# Patient Record
Sex: Female | Born: 1965
Health system: Southern US, Community
[De-identification: ages and names within clinical notes are randomized; demographics above are authoritative.]

## PROBLEM LIST (undated history)

## (undated) DIAGNOSIS — J45909 Unspecified asthma, uncomplicated: Secondary | ICD-10-CM

## (undated) DIAGNOSIS — H539 Unspecified visual disturbance: Secondary | ICD-10-CM

## (undated) DIAGNOSIS — G709 Myoneural disorder, unspecified: Secondary | ICD-10-CM

## (undated) DIAGNOSIS — E7800 Pure hypercholesterolemia, unspecified: Secondary | ICD-10-CM

## (undated) DIAGNOSIS — L309 Dermatitis, unspecified: Secondary | ICD-10-CM

## (undated) DIAGNOSIS — G35 Multiple sclerosis: Secondary | ICD-10-CM

## (undated) DIAGNOSIS — E119 Type 2 diabetes mellitus without complications: Secondary | ICD-10-CM

## (undated) DIAGNOSIS — I10 Essential (primary) hypertension: Secondary | ICD-10-CM

## (undated) DIAGNOSIS — D649 Anemia, unspecified: Secondary | ICD-10-CM

## (undated) DIAGNOSIS — E669 Obesity, unspecified: Secondary | ICD-10-CM

## (undated) HISTORY — PX: ABDOMINAL HYSTERECTOMY: SHX81

## (undated) HISTORY — PX: TUBAL LIGATION: SHX77

## (undated) HISTORY — PX: LASER ABLATION: SHX1947

## (undated) HISTORY — DX: Type 2 diabetes mellitus without complications: E11.9

## (undated) HISTORY — DX: Myoneural disorder, unspecified: G70.9

## (undated) HISTORY — DX: Pure hypercholesterolemia, unspecified: E78.00

## (undated) HISTORY — DX: Unspecified visual disturbance: H53.9

## (undated) HISTORY — PX: CERVICAL SPINE SURGERY: SHX589

## (undated) HISTORY — DX: Obesity, unspecified: E66.9

## (undated) HISTORY — PX: TENDON REPAIR: SHX5111

## (undated) HISTORY — DX: Unspecified asthma, uncomplicated: J45.909

## (undated) HISTORY — DX: Anemia, unspecified: D64.9

## (undated) HISTORY — PX: CARPAL TUNNEL RELEASE: SHX101

---

## 2009-08-03 ENCOUNTER — Other Ambulatory Visit: Admission: RE | Admit: 2009-08-03 | Discharge: 2009-08-03 | Payer: Self-pay | Admitting: Family Medicine

## 2009-08-03 ENCOUNTER — Ambulatory Visit: Payer: Self-pay | Admitting: Family Medicine

## 2009-08-03 DIAGNOSIS — G35 Multiple sclerosis: Secondary | ICD-10-CM | POA: Insufficient documentation

## 2009-08-03 DIAGNOSIS — J45909 Unspecified asthma, uncomplicated: Secondary | ICD-10-CM | POA: Insufficient documentation

## 2009-08-03 DIAGNOSIS — Z8669 Personal history of other diseases of the nervous system and sense organs: Secondary | ICD-10-CM | POA: Insufficient documentation

## 2009-08-03 DIAGNOSIS — J301 Allergic rhinitis due to pollen: Secondary | ICD-10-CM

## 2009-08-03 DIAGNOSIS — H469 Unspecified optic neuritis: Secondary | ICD-10-CM | POA: Insufficient documentation

## 2009-08-03 DIAGNOSIS — J4520 Mild intermittent asthma, uncomplicated: Secondary | ICD-10-CM | POA: Insufficient documentation

## 2009-08-03 DIAGNOSIS — D649 Anemia, unspecified: Secondary | ICD-10-CM | POA: Insufficient documentation

## 2009-08-03 HISTORY — DX: Allergic rhinitis due to pollen: J30.1

## 2009-08-06 LAB — CONVERTED CEMR LAB: Pap Smear: NEGATIVE

## 2009-08-27 ENCOUNTER — Ambulatory Visit: Payer: Self-pay | Admitting: Family Medicine

## 2009-08-27 ENCOUNTER — Encounter: Admission: RE | Admit: 2009-08-27 | Discharge: 2009-08-27 | Payer: Self-pay | Admitting: Family Medicine

## 2009-08-28 LAB — CONVERTED CEMR LAB
ALT: 12 units/L (ref 0–35)
AST: 14 units/L (ref 0–37)
Albumin: 3.6 g/dL (ref 3.5–5.2)
Alkaline Phosphatase: 74 units/L (ref 39–117)
Basophils Absolute: 0 10*3/uL (ref 0.0–0.1)
Basophils Relative: 0.8 % (ref 0.0–3.0)
Cholesterol: 171 mg/dL (ref 0–200)
Creatinine, Ser: 0.7 mg/dL (ref 0.4–1.2)
GFR calc non Af Amer: 100.02 mL/min (ref >60)
Lymphs Abs: 2.1 10*3/uL (ref 0.7–4.0)
MCHC: 34.3 g/dL (ref 30.0–36.0)
MCV: 88.9 fL (ref 78.0–100.0)
Monocytes Absolute: 0.3 10*3/uL (ref 0.1–1.0)
Monocytes Relative: 5.6 % (ref 3.0–12.0)
Neutro Abs: 3.2 10*3/uL (ref 1.4–7.7)
Platelets: 345 10*3/uL (ref 150.0–400.0)
Potassium: 4.4 meq/L (ref 3.5–5.1)
RDW: 13.8 % (ref 11.5–14.6)
Saturation Ratios: 20.6 % (ref 20.0–50.0)
Sodium: 139 meq/L (ref 135–145)
TSH: 1.27 microintl units/mL (ref 0.35–5.50)
Total Bilirubin: 0.9 mg/dL (ref 0.3–1.2)
Total Protein: 6.9 g/dL (ref 6.0–8.3)
Triglycerides: 68 mg/dL (ref 0.0–149.0)
VLDL: 13.6 mg/dL (ref 0.0–40.0)
WBC: 5.9 10*3/uL (ref 4.5–10.5)

## 2009-09-01 ENCOUNTER — Ambulatory Visit: Payer: Self-pay | Admitting: Family Medicine

## 2009-09-01 ENCOUNTER — Encounter (INDEPENDENT_AMBULATORY_CARE_PROVIDER_SITE_OTHER): Payer: Self-pay | Admitting: *Deleted

## 2009-10-06 ENCOUNTER — Ambulatory Visit: Payer: Self-pay | Admitting: Diagnostic Radiology

## 2009-10-06 ENCOUNTER — Ambulatory Visit: Payer: Self-pay | Admitting: Family Medicine

## 2009-10-06 ENCOUNTER — Emergency Department (HOSPITAL_BASED_OUTPATIENT_CLINIC_OR_DEPARTMENT_OTHER): Admission: EM | Admit: 2009-10-06 | Discharge: 2009-10-06 | Payer: Self-pay | Admitting: Emergency Medicine

## 2009-10-06 DIAGNOSIS — R079 Chest pain, unspecified: Secondary | ICD-10-CM | POA: Insufficient documentation

## 2010-02-23 NOTE — Consult Note (Signed)
Summary: [facility]  [facility]   Imported By: Lanelle Bal 09/07/2009 13:47:38  _____________________________________________________________________  External Attachment:    Type:   Image     Comment:   External Document

## 2010-02-23 NOTE — Assessment & Plan Note (Signed)
Summary: NEW TO ESTAB/CBS   Vital Signs:  Patient profile:   45 year old female Height:      68.5 inches Weight:      228 pounds BMI:     34.29 Temp:     98.3 degrees F oral Pulse rate:   97 / minute Resp:     18 per minute BP sitting:   140 / 90  (left arm)  Vitals Entered By: Jeremy Johann CMA (August 03, 2009 2:04 PM) CC: NEW TO ESTABLISH, CPX, PAP Comments REVIEWED MED LIST, PATIENT AGREED DOSE AND INSTRUCTION CORRECT    History of Present Illness: Pt is here for CPE , pap and labs.  Pt with hx MS--[place] just [place] and she needs a new neurologist.    No complaints.  Preventive Screening-Counseling & Management  Alcohol-Tobacco     Alcohol drinks/day: <1     Smoking Status: never  Caffeine-Diet-Exercise     Caffeine use/day: 1     Does Patient Exercise: no     Exercise Counseling: to improve exercise regimen  Hep-HIV-STD-Contraception     Dental Visit-last 6 months yes     Dental Care Counseling: not indicated; dental care within six months     SBE monthly: no     SBE Education/Counseling: to perform regular SBE      Sexual History:  currently monogamous.    Current Medications (verified): 1)  Vitamin D 2000 Unit Tabs (Cholecalciferol) .... Once Daily 2)  Amphetamine-Dextroamphetamine 10 Mg Xr24h-Cap (Amphetamine-Dextroamphetamine) .... 2 By Mouth Once Daily 3)  Copaxone 20 Mg/ml Kit (Glatiramer Acetate) .Marland Kitchen.. 1 Injection A Week  Allergies (verified): 1)  ! Asa 2)  ! Ibuprofen  Past History:  Family History: Last updated: 08/03/2009 DM-MOTHER Sharman Crate STROKE-MOTHER  Social History: Last updated: 08/03/2009 Married  Risk Factors: Alcohol Use: <1 (08/03/2009) Caffeine Use: 1 (08/03/2009) Exercise: no (08/03/2009)  Risk Factors: Smoking Status: never (08/03/2009)  Past Medical History: Anemia-NOS Asthma MS  Past Surgical History: Carpal tunneL-1998 BLOOD PATCH-2009?  Family History: Reviewed history and no changes  required. DM-MOTHER HTN-MOTHER STROKE-MOTHER  Social History: Reviewed history and no changes required. Married Does Patient Exercise:  no Smoking Status:  never Caffeine use/day:  1 Dental Care w/in 6 mos.:  yes Sexual History:  currently monogamous  Review of Systems      See HPI General:  Denies chills, fatigue, fever, loss of appetite, malaise, sleep disorder, sweats, weakness, and weight loss. Eyes:  Denies blurring, discharge, double vision, eye irritation, eye pain, halos, itching, light sensitivity, red eye, vision loss-1 eye, and vision loss-both eyes; optho-q1y hx optic neuritis. ENT:  Denies decreased hearing, difficulty swallowing, ear discharge, earache, hoarseness, nasal congestion, nosebleeds, postnasal drainage, ringing in ears, sinus pressure, and sore throat. CV:  Denies bluish discoloration of lips or nails, chest pain or discomfort, difficulty breathing at night, difficulty breathing while lying down, fainting, fatigue, leg cramps with exertion, lightheadness, near fainting, palpitations, shortness of breath with exertion, swelling of feet, swelling of hands, and weight gain. Resp:  Denies chest discomfort, chest pain with inspiration, cough, coughing up blood, excessive snoring, hypersomnolence, morning headaches, pleuritic, shortness of breath, sputum productive, and wheezing. GI:  Denies abdominal pain, bloody stools, change in bowel habits, constipation, dark tarry stools, diarrhea, excessive appetite, gas, hemorrhoids, indigestion, loss of appetite, and nausea. GU:  Denies abnormal vaginal bleeding, decreased libido, discharge, dysuria, genital sores, hematuria, incontinence, nocturia, urinary frequency, and urinary hesitancy. MS:  Denies joint pain, joint redness, joint  swelling, loss of strength, low back pain, mid back pain, muscle aches, muscle , cramps, muscle weakness, stiffness, and thoracic pain. Derm:  Denies changes in color of skin, changes in nail beds,  dryness, excessive perspiration, flushing, hair loss, insect bite(s), itching, lesion(s), poor wound healing, and rash. Neuro:  Denies brief paralysis, difficulty with concentration, disturbances in coordination, falling down, headaches, inability to speak, memory loss, numbness, poor balance, seizures, sensation of room spinning, tingling, tremors, visual disturbances, and weakness. Psych:  Denies alternate hallucination ( auditory/visual), anxiety, depression, easily angered, easily tearful, irritability, mental problems, panic attacks, sense of great danger, suicidal thoughts/plans, thoughts of violence, unusual visions or sounds, and thoughts /plans of harming others. Endo:  Denies cold intolerance, excessive hunger, excessive thirst, excessive urination, heat intolerance, polyuria, and weight change. Heme:  Denies abnormal bruising, bleeding, enlarge lymph nodes, fevers, pallor, and skin discoloration. Allergy:  Complains of seasonal allergies.  Physical Exam  General:  Well-developed,well-nourished,in no acute distress; alert,appropriate and cooperative throughout examination Head:  Normocephalic and atraumatic without obvious abnormalities. No apparent alopecia or balding. Eyes:  pupils equal, pupils round, and pupils reactive to light.  hyperpigmentation around eyes. Ears:  External ear exam shows no significant lesions or deformities.  Otoscopic examination reveals clear canals, tympanic membranes are intact bilaterally without bulging, retraction, inflammation or discharge. Hearing is grossly normal bilaterally. Nose:  External nasal examination shows no deformity or inflammation. Nasal mucosa are pink and moist without lesions or exudates. Mouth:  Oral mucosa and oropharynx without lesions or exudates.  Teeth in good repair. Neck:  No deformities, masses, or tenderness noted. Chest Wall:  No deformities, masses, or tenderness noted. Breasts:  No mass, nodules, thickening, tenderness,  bulging, retraction, inflamation, nipple discharge or skin changes noted.   Lungs:  Normal respiratory effort, chest expands symmetrically. Lungs are clear to auscultation, no crackles or wheezes. Heart:  normal rate and no murmur.   Abdomen:  Bowel sounds positive,abdomen soft and non-tender without masses, organomegaly or hernias noted. Rectal:  No external abnormalities noted. Normal sphincter tone. No rectal masses or tenderness.  heme neg brown stool Genitalia:  Pelvic Exam:        External: normal female genitalia without lesions or masses        Vagina: normal without lesions or masses        Cervix: normal without lesions or masses        Adnexa: normal bimanual exam without masses or fullness        Uterus: normal by palpation        Pap smear: performed Msk:  normal ROM, no joint tenderness, no joint swelling, no joint warmth, no redness over joints, no joint deformities, no joint instability, and no crepitation.   Pulses:  R posterior tibial normal, R dorsalis pedis normal, R carotid normal, L posterior tibial normal, L dorsalis pedis normal, and L carotid normal.   Extremities:  No clubbing, cyanosis, edema, or deformity noted with normal full range of motion of all joints.   Neurologic:  No cranial nerve deficits noted. Station and gait are normal. Plantar reflexes are down-going bilaterally. DTRs are symmetrical throughout. Sensory, motor and coordinative functions appear intact. Skin:  Intact without suspicious lesions or rashes Cervical Nodes:  No lymphadenopathy noted Axillary Nodes:  No palpable lymphadenopathy Psych:  Cognition and judgment appear intact. Alert and cooperative with normal attention span and concentration. No apparent delusions, illusions, hallucinations   Impression & Recommendations:  Problem # 1:  PREVENTIVE HEALTH CARE (  ICD-V70.0) fasting labs ghm utd  Orders: Radiology Referral (Radiology) EKG w/ Interpretation (93000)  Problem # 2:  MULTIPLE  SCLEROSIS (ICD-340)  Orders: Neurology Referral (Neuro) EKG w/ Interpretation (93000)  Problem # 3:  ALLERGIC RHINITIS, SEASONAL (ICD-477.0)  Orders: Pulmonary Referral (Pulmonary) EKG w/ Interpretation (93000)  Problem # 4:  ASTHMA (ICD-493.90)  Orders: Pulmonary Referral (Pulmonary) EKG w/ Interpretation (93000)  Problem # 5:  ANEMIA-NOS (ICD-285.9)  check labs  Orders: EKG w/ Interpretation (93000)  Problem # 6:  Hx of OPTIC NEURITIS (ICD-377.30)  Orders: Ophthalmology Referral (Ophthalmology) EKG w/ Interpretation (93000)  Complete Medication List: 1)  Vitamin D 2000 Unit Tabs (Cholecalciferol) .... Once daily 2)  Amphetamine-dextroamphetamine 10 Mg Xr24h-cap (Amphetamine-dextroamphetamine) .... 2 by mouth once daily 3)  Copaxone 20 Mg/ml Kit (Glatiramer acetate) .Marland Kitchen.. 1 injection a week 4)  Triamcinolone Acetonide 0.1 % Crea (Triamcinolone acetonide) .... Three times a day as needed  Patient Instructions: 1)  fasting labs--V70.0  cbcd, bmp, hep, lipid, tsh, UA , 285.9  ibc, ferritin, b12 Prescriptions: TRIAMCINOLONE ACETONIDE 0.1 % CREA (TRIAMCINOLONE ACETONIDE) three times a day as needed  #30g x 2   Entered and Authorized by:   Loreen Freud DO   Signed by:   Loreen Freud DO on 08/03/2009   Method used:   Electronically to        [facility] [phone]* (retail)       [address]       [place], [place]  [address]       Ph: [phone]       Fax: [phone]   RxID:   [id number] AMPHETAMINE-DEXTROAMPHETAMINE 10 MG XR24H-CAP (AMPHETAMINE-DEXTROAMPHETAMINE) 2 by mouth once daily  #60 x 0   Entered and Authorized by:   Loreen Freud DO   Signed by:   Loreen Freud DO on 08/03/2009   Method used:   Historical   RxID:   [id number]

## 2010-02-23 NOTE — Assessment & Plan Note (Signed)
Summary: sob- tingleing in legs/cbs   Vital Signs:  Patient profile:   45 year old female Weight:      232.6 pounds O2 Sat:      98 % on Room air Temp:     98.6 degrees F oral Pulse rate:   108 / minute Pulse rhythm:   regular BP sitting:   118 / 82  (right arm)  Vitals Entered By: Almeta Monas CMA Duncan Dull) (October 06, 2009 11:05 AM)  O2 Flow:  Room air CC: c/o SOB, Chest tightness, palpatations,and pain down thigh and legs after taking meds   History of Present Illness:       This is a 45 year old woman who presents with Chest Pain.  The symptoms began 1 day ago.  Pt also had numbness in legs yesterday.  that got worse with getting up.  Eventually it went away.  Pt felt tired and sluggish after that.  The patient reports resting chest pain, shortness of breath, and palpitations, but denies exertional chest pain, nausea, vomiting, diaphoresis, dizziness, light headedness, syncope, and indigestion.  The pain is described as intermittent and band-like.  The pain is located in the substernal area and the pain does not radiate.  Episodes of chest pain last >30 minutes.  The pain is relieved or improved with rest and change in position.    Current Medications (verified): 1)  Vitamin D 2000 Unit Tabs (Cholecalciferol) .... Once Daily 2)  Amphetamine-Dextroamphetamine 10 Mg Xr24h-Cap (Amphetamine-Dextroamphetamine) .... 2 By Mouth Once Daily 3)  Copaxone 20 Mg/ml Kit (Glatiramer Acetate) .Marland Kitchen.. 1 Injection A Week 4)  Triamcinolone Acetonide 0.1 % Crea (Triamcinolone Acetonide) .... Three Times A Day As Needed  Allergies (verified): 1)  ! Asa 2)  ! Ibuprofen  Past History:  Past medical, surgical, family and social histories (including risk factors) reviewed for relevance to current acute and chronic problems.  Past Medical History: Reviewed history from 08/03/2009 and no changes required. Anemia-NOS Asthma MS  Past Surgical History: Reviewed history from 08/03/2009 and no  changes required. Carpal tunneL-1998 BLOOD PATCH-2009?  Family History: Reviewed history from 08/03/2009 and no changes required. DM-MOTHER HTN-MOTHER STROKE-MOTHER  Social History: Reviewed history from 08/03/2009 and no changes required. Married  Review of Systems      See HPI  Physical Exam  Neck:  No deformities, masses, or tenderness noted. Lungs:  Normal respiratory effort, chest expands symmetrically. Lungs are clear to auscultation, no crackles or wheezes. Heart:  regular rhythm and no murmur.   tachy Extremities:  No clubbing, cyanosis, edema, or deformity noted with normal full range of motion of all joints.   Psych:  Cognition and judgment appear intact. Alert and cooperative with normal attention span and concentration. No apparent delusions, illusions, hallucinations   Impression & Recommendations:  Problem # 1:  CHEST PAIN UNSPECIFIED (ICD-786.50)  pt was sent to er for evaluation=---- r/o PE  Orders: EKG w/ Interpretation (93000)  Complete Medication List: 1)  Vitamin D 2000 Unit Tabs (Cholecalciferol) .... Once daily 2)  Amphetamine-dextroamphetamine 10 Mg Xr24h-cap (Amphetamine-dextroamphetamine) .... 2 by mouth once daily 3)  Copaxone 20 Mg/ml Kit (Glatiramer acetate) .Marland Kitchen.. 1 injection a week 4)  Triamcinolone Acetonide 0.1 % Crea (Triamcinolone acetonide) .... Three times a day as needed

## 2010-02-23 NOTE — Letter (Signed)
Summary: [facility]: Immunoassay Fecal Occult Blood (iFOB) Order Form  [facility]  [address] [place], [place] [address]   Phone: [phone]  Fax: [phone]      [facility]: Immunoassay Fecal Occult Blood (iFOB) Order Form   September 01, 2009 MRN: [id number]   Cathy [name] 12-24-1965   Physicican Name:_______dr lowne__________________  Diagnosis Code:_________v76.51_________________      Jeremy Johann CMA

## 2010-03-22 ENCOUNTER — Telehealth: Payer: Self-pay | Admitting: Family Medicine

## 2010-04-01 NOTE — Progress Notes (Signed)
Summary: change physician  Phone Note Call from Patient Call back at [phone]   Caller: Patient Summary of Call: Pt would like to be referred to a new neurologist. Pt notes that she is having difficulty getting them to fill meds and provide the care she needs. Pls advise on referral........Marland KitchenFelecia Deloach CMA  March 22, 2010 8:39 AM   Follow-up for Phone Call        referral put in Follow-up by: Loreen Freud DO,  March 22, 2010 11:27 AM  Additional Follow-up for Phone Call Additional follow up Details #1::        pt aware referral put in.... Almeta Monas CMA Duncan Dull)  March 22, 2010 11:30 AM

## 2010-04-08 LAB — BASIC METABOLIC PANEL
CO2: 27 mEq/L (ref 19–32)
Calcium: 9.8 mg/dL (ref 8.4–10.5)
Chloride: 103 mEq/L (ref 96–112)
GFR calc Af Amer: >60 mL/min (ref >60)
GFR calc non Af Amer: >60 mL/min (ref >60)
Glucose, Bld: 108 mg/dL — ABNORMAL HIGH (ref 70–99)

## 2010-04-08 LAB — POCT CARDIAC MARKERS
CKMB, poc: <1 ng/mL — ABNORMAL LOW (ref 1.0–8.0)
Myoglobin, poc: 28.3 ng/mL (ref 12–200)
Troponin i, poc: <0.05 ng/mL (ref 0.00–0.09)

## 2010-04-08 LAB — CBC
HCT: 37.2 % (ref 36.0–46.0)
Hemoglobin: 12.5 g/dL (ref 12.0–15.0)
MCH: 29.9 pg (ref 26.0–34.0)
Platelets: 350 10*3/uL (ref 150–400)
RDW: 12.9 % (ref 11.5–15.5)
WBC: 6 10*3/uL (ref 4.0–10.5)

## 2010-04-08 LAB — DIFFERENTIAL
Basophils Absolute: 0 10*3/uL (ref 0.0–0.1)
Basophils Relative: 1 % (ref 0–1)
Eosinophils Absolute: 0.2 10*3/uL (ref 0.0–0.7)
Eosinophils Relative: 4 % (ref 0–5)
Lymphocytes Relative: 33 % (ref 12–46)

## 2010-04-08 LAB — PREGNANCY, URINE: Preg Test, Ur: NEGATIVE

## 2010-06-12 ENCOUNTER — Other Ambulatory Visit: Payer: Self-pay | Admitting: Family Medicine

## 2010-07-30 ENCOUNTER — Other Ambulatory Visit: Payer: Self-pay | Admitting: Internal Medicine

## 2010-07-30 DIAGNOSIS — Z1231 Encounter for screening mammogram for malignant neoplasm of breast: Secondary | ICD-10-CM

## 2010-09-08 ENCOUNTER — Encounter: Payer: Self-pay | Admitting: Family Medicine

## 2010-09-13 ENCOUNTER — Ambulatory Visit
Admission: RE | Admit: 2010-09-13 | Discharge: 2010-09-13 | Disposition: A | Discharge status: Home / Self Care | Payer: BC Managed Care – PPO | Source: Ambulatory Visit | Attending: Internal Medicine | Admitting: Internal Medicine

## 2010-09-13 DIAGNOSIS — Z1231 Encounter for screening mammogram for malignant neoplasm of breast: Secondary | ICD-10-CM

## 2011-03-12 ENCOUNTER — Emergency Department (HOSPITAL_BASED_OUTPATIENT_CLINIC_OR_DEPARTMENT_OTHER)
Admission: EM | Admit: 2011-03-12 | Discharge: 2011-03-12 | Disposition: A | Discharge status: Home / Self Care | Payer: Federal, State, Local not specified - PPO | Attending: Emergency Medicine | Admitting: Emergency Medicine

## 2011-03-12 ENCOUNTER — Encounter (HOSPITAL_BASED_OUTPATIENT_CLINIC_OR_DEPARTMENT_OTHER): Payer: Self-pay | Admitting: *Deleted

## 2011-03-12 DIAGNOSIS — G35 Multiple sclerosis: Secondary | ICD-10-CM | POA: Insufficient documentation

## 2011-03-12 DIAGNOSIS — K529 Noninfective gastroenteritis and colitis, unspecified: Secondary | ICD-10-CM

## 2011-03-12 DIAGNOSIS — R5381 Other malaise: Secondary | ICD-10-CM | POA: Insufficient documentation

## 2011-03-12 DIAGNOSIS — R5383 Other fatigue: Secondary | ICD-10-CM | POA: Insufficient documentation

## 2011-03-12 DIAGNOSIS — K5289 Other specified noninfective gastroenteritis and colitis: Secondary | ICD-10-CM | POA: Insufficient documentation

## 2011-03-12 HISTORY — DX: Multiple sclerosis: G35

## 2011-03-12 HISTORY — DX: Dermatitis, unspecified: L30.9

## 2011-03-12 LAB — URINALYSIS, ROUTINE W REFLEX MICROSCOPIC
Bilirubin Urine: NEGATIVE
Glucose, UA: NEGATIVE mg/dL
Hgb urine dipstick: NEGATIVE
Leukocytes, UA: NEGATIVE
Specific Gravity, Urine: 1.017 (ref 1.005–1.030)

## 2011-03-12 LAB — BASIC METABOLIC PANEL
Chloride: 100 mEq/L (ref 96–112)
Creatinine, Ser: 0.6 mg/dL (ref 0.50–1.10)
GFR calc Af Amer: >90 mL/min (ref >90)
GFR calc non Af Amer: >90 mL/min (ref >90)

## 2011-03-12 MED ORDER — SODIUM CHLORIDE 0.9 % IV BOLUS (SEPSIS)
1000.0000 mL | Freq: Once | INTRAVENOUS | Status: AC
  Administered 2011-03-12: 1000 mL via INTRAVENOUS

## 2011-03-12 NOTE — ED Notes (Signed)
Pt describes flu-like s/s since 0300. Diarrhea, nausea, generalized weakness and body aches.

## 2011-03-12 NOTE — Discharge Instructions (Signed)
Avoid milk or foods containing milk until diarrhea stops. You may eat normally otherwise. He may take Imodium right ear as directed for diarrhea. Your blood sugar today was mildly elevated at 130. Ask your physician to recheck a fasting blood sugar or hemoglobin A1c within the next one or 2 weeks. Return or see your doctor if diarrhea has not stopped within the next 2 or 3 days or if you feel worse for any reason, come back sooner

## 2011-03-12 NOTE — ED Provider Notes (Signed)
History    Scribed for Doug Sou, MD, the patient was seen in room MH05/MH05. This chart was scribed by Katha Cabal.   CSN: [id number]  Arrival date & time 03/12/11  1256   First MD Initiated Contact with Patient 03/12/11 1543      Chief Complaint  Patient presents with  . Weakness    (Consider location/radiation/quality/duration/timing/severity/associated sxs/prior treatment) HPI  Cathy [name] is a 46 y.o. female who presents to the Emergency Department complaining of persistent generalized weakness and flu like symptoms today.  Symptoms are associated with myalgias, nausea, diarrhea  Patient reports 6-7 episodes. No blood per rectum.  Symptoms are not associated with fever, cough, sore throat, vomiting.  Patient has multiple sclerosis and eczema.  Family member reports patient with burning sensation in abdomen.  Patient has not taken anything for symptoms.  Patient does not report nausea currently.     PCP Loreen Freud, DO, DO     Past Medical History  Diagnosis Date  . Multiple sclerosis   . Eczema     Past Surgical History  Procedure Date  . Tubal ligation   . Carpal tunnel release   . Laser ablation     History reviewed. No pertinent family history.  History  Substance Use Topics  . Smoking status: Never Smoker   . Smokeless tobacco: Not on file  . Alcohol Use: occasional EtOH use     OB History    Grav Para Term Preterm Abortions TAB SAB Ect Mult Living                  Review of Systems  Constitutional: Positive for fatigue.  HENT: Negative.   Respiratory: Negative.   Cardiovascular: Negative.   Gastrointestinal: Positive for nausea and diarrhea.  Musculoskeletal: Positive for myalgias.  Skin: Negative.   Neurological: Positive for weakness (generalized ).  Hematological: Negative.   Psychiatric/Behavioral: Negative.     Allergies  Aspirin and Ibuprofen  Home Medications   Current Outpatient Rx  Name Route Sig Dispense Refill   . VITAMIN D 2000 UNITS PO CAPS Oral Take 2,000 Units by mouth daily.    Marland Kitchen DEXTROAMPHETAMINE SULFATE ER 10 MG PO CP24 Oral Take 20-40 mg by mouth daily.    Marland Kitchen LORATADINE 10 MG PO TABS Oral Take 10 mg by mouth daily.    . STUDY MEDICATION Oral Take 1 tablet by mouth daily.    . TRIAMCINOLONE ACETONIDE 0.1 % EX CREA  APPLY TO THE AFFECTED AREA THREE TIMES DAILY AS NEEDED 30 g 1    BP 116/75  Pulse 98  Temp(Src) 99.4 F (37.4 C) (Oral)  Resp 16  Ht 5\' 6"  (1.676 m)  Wt 227 lb (102.967 kg)  BMI 36.64 kg/m2  SpO2 100%  Physical Exam  Nursing note and vitals reviewed. Constitutional: She appears well-developed and well-nourished.  HENT:  Head: Normocephalic and atraumatic.  Mouth/Throat: Mucous membranes are dry.  Eyes: Conjunctivae are normal. Pupils are equal, round, and reactive to light.  Neck: Neck supple. No tracheal deviation present. No thyromegaly present.  Cardiovascular: Normal rate and regular rhythm.   No murmur heard. Pulmonary/Chest: Effort normal and breath sounds normal.  Abdominal: Soft. Bowel sounds are normal. She exhibits no distension. There is no tenderness.  Musculoskeletal: Normal range of motion. She exhibits no edema and no tenderness.  Neurological: She is alert. Coordination normal.       Gait normal  Skin: Skin is warm and dry. No rash noted.  Psychiatric: She  has a normal mood and affect.    ED Course  Procedures (including critical care time)   DIAGNOSTIC STUDIES: Oxygen Saturation is 100% on room air, normal by my interpretation.     COORDINATION OF CARE: 3:55 PM  Physical exam complete. IV fluids. 5:27 PM  Recheck.  Advised patient to increase fluid intake.  Plan to discharge patient.  Patient agrees with plan.         LABS / RADIOLOGY:   Labs Reviewed  BASIC METABOLIC PANEL - Abnormal; Notable for the following:    Glucose, Bld 130 (*)    All other components within normal limits  URINALYSIS, ROUTINE W REFLEX MICROSCOPIC    PREGNANCY, URINE   No results found. 5:25 PM feels much improved after treatment with intravenous fluids. Patient alert no distress  No diagnosis found.  Results for orders placed during the hospital encounter of 03/12/11  URINALYSIS, ROUTINE W REFLEX MICROSCOPIC      Component Value Range   Color, Urine YELLOW  YELLOW    APPearance CLEAR  CLEAR    Specific Gravity, Urine 1.017  1.005 - 1.030    pH 5.5  5.0 - 8.0    Glucose, UA NEGATIVE  NEGATIVE (mg/dL)   Hgb urine dipstick NEGATIVE  NEGATIVE    Bilirubin Urine NEGATIVE  NEGATIVE    Ketones, ur NEGATIVE  NEGATIVE (mg/dL)   Protein, ur NEGATIVE  NEGATIVE (mg/dL)   Urobilinogen, UA 0.2  0.0 - 1.0 (mg/dL)   Nitrite NEGATIVE  NEGATIVE    Leukocytes, UA NEGATIVE  NEGATIVE   PREGNANCY, URINE      Component Value Range   Preg Test, Ur NEGATIVE  NEGATIVE   BASIC METABOLIC PANEL      Component Value Range   Sodium 136  135 - 145 (mEq/L)   Potassium 3.8  3.5 - 5.1 (mEq/L)   Chloride 100  96 - 112 (mEq/L)   CO2 25  19 - 32 (mEq/L)   Glucose, Bld 130 (*) 70 - 99 (mg/dL)   BUN 11  6 - 23 (mg/dL)   Creatinine, Ser 4.09  0.50 - 1.10 (mg/dL)   Calcium 81.1  8.4 - 10.5 (mg/dL)   GFR calc non Af Amer >90  >90 (mL/min)   GFR calc Af Amer >90  >90 (mL/min)   No results found.   MDM   clinically , pt was mildly dehydrated  on initial presentation. Plan: encourage po sugar freee drinks Blood sugar recheck with pmd Imodium ad prn Dx #1 enteritis  #2 dehydration  #3 hyperglycemia    I personally performed the services described in this documentation, which was scribed in my presence. The recorded information has been reviewed and considered.  Doug Sou, MD 03/12/11 1731

## 2011-03-12 NOTE — ED Notes (Signed)
Patient is resting comfortably on the stretcher, she was asked to undress and place gown on.  Pt is not responding to questions, her husband sitting at the bedside answering questions.  Pt was asked to answer questions herself and began to speak at that point.

## 2012-06-28 ENCOUNTER — Other Ambulatory Visit: Payer: Self-pay | Admitting: Neurology

## 2012-10-18 ENCOUNTER — Other Ambulatory Visit: Payer: Self-pay | Admitting: Diagnostic Neuroimaging

## 2012-11-05 ENCOUNTER — Emergency Department (HOSPITAL_BASED_OUTPATIENT_CLINIC_OR_DEPARTMENT_OTHER): Payer: Federal, State, Local not specified - PPO

## 2012-11-05 ENCOUNTER — Encounter (HOSPITAL_BASED_OUTPATIENT_CLINIC_OR_DEPARTMENT_OTHER): Payer: Self-pay | Admitting: Emergency Medicine

## 2012-11-05 ENCOUNTER — Emergency Department (HOSPITAL_BASED_OUTPATIENT_CLINIC_OR_DEPARTMENT_OTHER)
Admission: EM | Admit: 2012-11-05 | Discharge: 2012-11-05 | Disposition: A | Discharge status: Home / Self Care | Payer: Federal, State, Local not specified - PPO | Attending: Emergency Medicine | Admitting: Emergency Medicine

## 2012-11-05 DIAGNOSIS — Z90711 Acquired absence of uterus with remaining cervical stump: Secondary | ICD-10-CM | POA: Insufficient documentation

## 2012-11-05 DIAGNOSIS — Z8669 Personal history of other diseases of the nervous system and sense organs: Secondary | ICD-10-CM | POA: Insufficient documentation

## 2012-11-05 DIAGNOSIS — M549 Dorsalgia, unspecified: Secondary | ICD-10-CM | POA: Insufficient documentation

## 2012-11-05 DIAGNOSIS — I10 Essential (primary) hypertension: Secondary | ICD-10-CM | POA: Insufficient documentation

## 2012-11-05 DIAGNOSIS — Z872 Personal history of diseases of the skin and subcutaneous tissue: Secondary | ICD-10-CM | POA: Insufficient documentation

## 2012-11-05 DIAGNOSIS — Z79899 Other long term (current) drug therapy: Secondary | ICD-10-CM | POA: Insufficient documentation

## 2012-11-05 DIAGNOSIS — N390 Urinary tract infection, site not specified: Secondary | ICD-10-CM

## 2012-11-05 DIAGNOSIS — Z3202 Encounter for pregnancy test, result negative: Secondary | ICD-10-CM | POA: Insufficient documentation

## 2012-11-05 DIAGNOSIS — R1031 Right lower quadrant pain: Secondary | ICD-10-CM

## 2012-11-05 HISTORY — DX: Essential (primary) hypertension: I10

## 2012-11-05 LAB — COMPREHENSIVE METABOLIC PANEL
Albumin: 3.4 g/dL — ABNORMAL LOW (ref 3.5–5.2)
Alkaline Phosphatase: 83 U/L (ref 39–117)
BUN: 10 mg/dL (ref 6–23)
Calcium: 9.3 mg/dL (ref 8.4–10.5)
Chloride: 101 mEq/L (ref 96–112)
Creatinine, Ser: 0.7 mg/dL (ref 0.50–1.10)
GFR calc Af Amer: >90 mL/min (ref >90)
GFR calc non Af Amer: >90 mL/min (ref >90)
Glucose, Bld: 134 mg/dL — ABNORMAL HIGH (ref 70–99)
Potassium: 3.9 mEq/L (ref 3.5–5.1)
Total Bilirubin: 0.3 mg/dL (ref 0.3–1.2)

## 2012-11-05 LAB — WET PREP, GENITAL
Clue Cells Wet Prep HPF POC: NONE SEEN
Trich, Wet Prep: NONE SEEN

## 2012-11-05 LAB — CBC WITH DIFFERENTIAL/PLATELET
Basophils Relative: 0 % (ref 0–1)
Eosinophils Relative: 4 % (ref 0–5)
HCT: 36 % (ref 36.0–46.0)
Hemoglobin: 12.2 g/dL (ref 12.0–15.0)
Lymphs Abs: 0.3 10*3/uL — ABNORMAL LOW (ref 0.7–4.0)
MCH: 29.5 pg (ref 26.0–34.0)
MCHC: 33.9 g/dL (ref 30.0–36.0)
Monocytes Absolute: 0.4 10*3/uL (ref 0.1–1.0)
Monocytes Relative: 9 % (ref 3–12)
Neutro Abs: 3.1 10*3/uL (ref 1.7–7.7)
RBC: 4.14 MIL/uL (ref 3.87–5.11)
RDW: 13.2 % (ref 11.5–15.5)

## 2012-11-05 LAB — URINALYSIS, ROUTINE W REFLEX MICROSCOPIC
Bilirubin Urine: NEGATIVE
Hgb urine dipstick: NEGATIVE
Protein, ur: NEGATIVE mg/dL
Specific Gravity, Urine: 1.018 (ref 1.005–1.030)
Urobilinogen, UA: 0.2 mg/dL (ref 0.0–1.0)
pH: 6 (ref 5.0–8.0)

## 2012-11-05 LAB — URINE MICROSCOPIC-ADD ON

## 2012-11-05 LAB — GC/CHLAMYDIA PROBE AMP
CT Probe RNA: NEGATIVE
GC Probe RNA: NEGATIVE

## 2012-11-05 MED ORDER — IOHEXOL 300 MG/ML  SOLN
100.0000 mL | Freq: Once | INTRAMUSCULAR | Status: AC | PRN
  Administered 2012-11-05: 100 mL via INTRAVENOUS

## 2012-11-05 MED ORDER — HYDROCODONE-ACETAMINOPHEN 5-325 MG PO TABS: 1.0000 | ORAL_TABLET | Freq: Four times a day (QID) | ORAL | Status: DC | PRN

## 2012-11-05 MED ORDER — MORPHINE SULFATE 4 MG/ML IJ SOLN
4.0000 mg | Freq: Once | INTRAMUSCULAR | Status: AC
  Administered 2012-11-05: 4 mg via INTRAVENOUS
  Filled 2012-11-05: qty 1

## 2012-11-05 MED ORDER — SODIUM CHLORIDE 0.9 % IV BOLUS (SEPSIS)
1000.0000 mL | Freq: Once | INTRAVENOUS | Status: AC
  Administered 2012-11-05: 1000 mL via INTRAVENOUS

## 2012-11-05 MED ORDER — CEPHALEXIN 500 MG PO CAPS: 500.0000 mg | ORAL_CAPSULE | Freq: Four times a day (QID) | ORAL | Status: DC

## 2012-11-05 MED ORDER — SODIUM CHLORIDE 0.9 % IV SOLN
Freq: Once | INTRAVENOUS | Status: AC
  Administered 2012-11-05: 06:00:00 via INTRAVENOUS

## 2012-11-05 MED ORDER — HYDROCODONE-ACETAMINOPHEN 5-325 MG PO TABS
2.0000 | ORAL_TABLET | Freq: Once | ORAL | Status: AC
  Administered 2012-11-05: 2 via ORAL
  Filled 2012-11-05: qty 2

## 2012-11-05 MED ORDER — PHENAZOPYRIDINE HCL 95 MG PO TABS: 95.0000 mg | ORAL_TABLET | Freq: Three times a day (TID) | ORAL | Status: DC | PRN

## 2012-11-05 NOTE — ED Notes (Signed)
Pt requesting more pain medication. MD made aware

## 2012-11-05 NOTE — ED Notes (Signed)
Patient transported to CT 

## 2012-11-05 NOTE — ED Provider Notes (Signed)
CSN: [id number]     Arrival date & time 11/05/12  0358 History   First MD Initiated Contact with Patient 11/05/12 0407     Chief Complaint  Patient presents with  . Abdominal Pain  . Back Pain   (Consider location/radiation/quality/duration/timing/severity/associated sxs/prior Treatment) HPI Comments: Pt with hx of MS, partial hysterectomy comes in with cc of abd pain. Pt's pain is located in the RLQ and started 2 days ago. The pain is intermittent, but very frequent, and stabbing in nature, radiating to the flank region. Pt has no dysuria, hematuria. She hasn o vaginal bleeding, discharge. No diarrhea, no n/v/f/c. No hx of similar pain.  Patient is a 47 y.o. female presenting with abdominal pain and back pain. The history is provided by the patient.  Abdominal Pain Associated symptoms: no chest pain, no constipation, no dysuria, no nausea, no shortness of breath, no vaginal bleeding, no vaginal discharge and no vomiting   Back Pain Associated symptoms: abdominal pain   Associated symptoms: no chest pain, no dysuria and no headaches     Past Medical History  Diagnosis Date  . Multiple sclerosis   . Eczema   . Hypertension    Past Surgical History  Procedure Laterality Date  . Tubal ligation    . Carpal tunnel release    . Laser ablation    . Abdominal hysterectomy     No family history on file. History  Substance Use Topics  . Smoking status: Never Smoker   . Smokeless tobacco: Not on file  . Alcohol Use: Yes     Comment: occasionally   OB History   Grav Para Term Preterm Abortions TAB SAB Ect Mult Living                 Review of Systems  Constitutional: Positive for activity change.  Respiratory: Negative for shortness of breath.   Cardiovascular: Negative for chest pain.  Gastrointestinal: Positive for abdominal pain. Negative for nausea, vomiting, constipation and blood in stool.  Genitourinary: Negative for dysuria, vaginal bleeding and vaginal discharge.   Musculoskeletal: Positive for back pain. Negative for neck pain.  Neurological: Negative for headaches.    Allergies  Aspirin and Ibuprofen  Home Medications   Current Outpatient Rx  Name  Route  Sig  Dispense  Refill  . cephALEXin (KEFLEX) 500 MG capsule   Oral   Take 1 capsule (500 mg total) by mouth 4 (four) times daily.   20 capsule   0   . Cholecalciferol (VITAMIN D) 2000 UNITS CAPS   Oral   Take 2,000 Units by mouth daily.         Marland Kitchen dextroamphetamine (DEXEDRINE) 10 MG 24 hr capsule   Oral   Take 20-40 mg by mouth daily.         Marland Kitchen GILENYA 0.5 MG CAPS      TAKE ONE CAPSULE (0.5 MG) BY MOUTH EACH DAY. MAY TAKE WITH OR WITHOUTFOOD. STORE AT ROOM TEMPERATURE.   30 capsule   0     PATIENT NEEDS TO SCHEDULE APPT   . HYDROcodone-acetaminophen (NORCO/VICODIN) 5-325 MG per tablet   Oral   Take 1 tablet by mouth every 6 (six) hours as needed for pain.   15 tablet   0   . loratadine (CLARITIN) 10 MG tablet   Oral   Take 10 mg by mouth daily.         . phenazopyridine (PYRIDIUM) 95 MG tablet   Oral   Take 1  tablet (95 mg total) by mouth 3 (three) times daily as needed for pain.   10 tablet   0   . STUDY MEDICATION   Oral   Take 1 tablet by mouth daily.         Marland Kitchen triamcinolone (KENALOG) 0.1 % cream      APPLY TO THE AFFECTED AREA THREE TIMES DAILY AS NEEDED   30 g   1    BP 117/75  Pulse 89  Temp(Src) 98.3 F (36.8 C) (Oral)  Resp 20  Ht 5' 5.5" (1.664 m)  Wt 230 lb (104.327 kg)  BMI 37.68 kg/m2  SpO2 99% Physical Exam  Nursing note and vitals reviewed. Constitutional: She is oriented to person, place, and time. She appears well-developed and well-nourished.  HENT:  Head: Normocephalic and atraumatic.  Eyes: Conjunctivae and EOM are normal. Pupils are equal, round, and reactive to light.  Neck: Normal range of motion. Neck supple.  Cardiovascular: Normal rate, regular rhythm, normal heart sounds and intact distal pulses.   No murmur  heard. Pulmonary/Chest: Effort normal. No respiratory distress. She has no wheezes.  Abdominal: Soft. Bowel sounds are normal. She exhibits no distension. There is tenderness. There is no rebound and no guarding.  RLQ tenderness, suprapubic tenderness, Rt flank tenderness.  Genitourinary: Vagina normal and uterus normal.  External exam - normal, no lesions Speculum exam: Pt has some white discharge, no blood Bimanual exam: no tenderness, no masses.  Neurological: She is alert and oriented to person, place, and time.  Skin: Skin is warm and dry.    ED Course  Procedures (including critical care time) Labs Review Labs Reviewed  WET PREP, GENITAL - Abnormal; Notable for the following:    WBC, Wet Prep HPF POC FEW (*)    All other components within normal limits  URINALYSIS, ROUTINE W REFLEX MICROSCOPIC - Abnormal; Notable for the following:    Nitrite POSITIVE (*)    Leukocytes, UA SMALL (*)    All other components within normal limits  CBC WITH DIFFERENTIAL - Abnormal; Notable for the following:    WBC 3.9 (*)    Neutrophils Relative % 80 (*)    Lymphocytes Relative 7 (*)    Lymphs Abs 0.3 (*)    All other components within normal limits  COMPREHENSIVE METABOLIC PANEL - Abnormal; Notable for the following:    Glucose, Bld 134 (*)    Albumin 3.4 (*)    All other components within normal limits  URINE MICROSCOPIC-ADD ON - Abnormal; Notable for the following:    Squamous Epithelial / LPF FEW (*)    Bacteria, UA MANY (*)    All other components within normal limits  GC/CHLAMYDIA PROBE AMP  URINE CULTURE  PREGNANCY, URINE   Imaging Review Ct Abdomen Pelvis W Contrast  11/05/2012   *RADIOLOGY REPORT*  Clinical Data: Right lower quadrant and back pain.  CT ABDOMEN AND PELVIS WITH CONTRAST  Technique:  Multidetector CT imaging of the abdomen and pelvis was performed following the standard protocol during bolus administration of intravenous contrast.  Contrast: OMNIPAQUE  IOHEXOL 300 MG/ML  SOLN  Comparison: None available at time of study interpretation.  Findings: 6mm rounded ground-glass nodule on the right middle lobe, axial 1/98.  The spleen, pancreas, gallbladder and adrenal glands are normal in size, morphology and enhancement characteristics. The liver is mildly diffusely hypodense suggesting fatty infiltration.  A few scattered sub centimeter hypodensities likely reflect cysts, the liver is otherwise unremarkable.  The stomach, small  and large bowel are normal in course and caliber without definite wall thickening or inflammatory changes though sensitivity may be decreased by lack of enteric contrast.  Normal appendix.  No free fluid nor free air.  The kidneys are well-located, demonstrating normal morphology, size and enhancement without renal masses, nephrolithiasis or hydronephrosis.  Ureters are unremarkable.  Urinary bladder is partially distended and appears normal.  Great vessels are normal in course and caliber.  No lymphadenopathy by CT size criteria.  Internal reproductive organs appear non suspicious; 3.7 cm low density right adnexal cyst. Status post apparent hysterectomy.  The included soft tissues and osseous structures are non-suspicious.  IMPRESSION: No acute intra-abdominal pelvic process.  5 mm right middle lobe ground-glass nodule, recommend follow-up CT of the chest and 12 months if low risk, 6-12 months if high risk patient.  Mild fatty liver.   Original Report Authenticated By: Awilda Metro    EKG Interpretation   None       MDM   1. Abdominal pain, right lower quadrant   2. UTI (lower urinary tract infection)    Pt comes in w/ cc of abd pain x 2 days. + flank pain, but no uti like sx. UA is + for nitrites. CT ordered - shows no renal stones, appy or even pyelo. She is s/p hysterectomy.  Intermittent pain x 2 days, likely not ovarian torsion.  Given flank pain with + UA, and hx of MS, will treat this as pyelo. Gyne f/u  given.  Derwood Kaplan, MD 11/05/12 [phone]

## 2012-11-05 NOTE — ED Notes (Signed)
MD at bedside. 

## 2012-11-05 NOTE — ED Notes (Signed)
Pt complains of right lower abdominal pain that goes into right flank area.  Denies n/v.  Denies hematuria.

## 2012-11-05 NOTE — ED Notes (Signed)
Pt attempted urine specimen.  Unable at this time.

## 2012-11-06 LAB — URINE CULTURE: Colony Count: >=100000

## 2013-02-21 LAB — HM COLONOSCOPY

## 2013-04-30 DIAGNOSIS — G35 Multiple sclerosis: Secondary | ICD-10-CM | POA: Insufficient documentation

## 2013-04-30 DIAGNOSIS — R911 Solitary pulmonary nodule: Secondary | ICD-10-CM | POA: Insufficient documentation

## 2013-05-01 DIAGNOSIS — E119 Type 2 diabetes mellitus without complications: Secondary | ICD-10-CM | POA: Insufficient documentation

## 2013-05-01 DIAGNOSIS — E785 Hyperlipidemia, unspecified: Secondary | ICD-10-CM | POA: Insufficient documentation

## 2013-05-01 DIAGNOSIS — E1169 Type 2 diabetes mellitus with other specified complication: Secondary | ICD-10-CM | POA: Insufficient documentation

## 2013-05-27 DIAGNOSIS — E1159 Type 2 diabetes mellitus with other circulatory complications: Secondary | ICD-10-CM | POA: Insufficient documentation

## 2013-05-27 DIAGNOSIS — I10 Essential (primary) hypertension: Secondary | ICD-10-CM | POA: Insufficient documentation

## 2013-07-29 DIAGNOSIS — M5412 Radiculopathy, cervical region: Secondary | ICD-10-CM | POA: Insufficient documentation

## 2013-08-02 DIAGNOSIS — M5000 Cervical disc disorder with myelopathy, unspecified cervical region: Secondary | ICD-10-CM | POA: Insufficient documentation

## 2013-08-22 DIAGNOSIS — G4700 Insomnia, unspecified: Secondary | ICD-10-CM | POA: Insufficient documentation

## 2013-11-29 DIAGNOSIS — M4802 Spinal stenosis, cervical region: Secondary | ICD-10-CM | POA: Insufficient documentation

## 2014-02-26 ENCOUNTER — Ambulatory Visit: Payer: Self-pay | Admitting: Neurology

## 2014-03-04 ENCOUNTER — Encounter: Payer: Self-pay | Admitting: Neurology

## 2014-05-21 ENCOUNTER — Encounter: Payer: Self-pay | Admitting: Neurology

## 2014-05-21 ENCOUNTER — Ambulatory Visit (INDEPENDENT_AMBULATORY_CARE_PROVIDER_SITE_OTHER): Payer: Federal, State, Local not specified - PPO | Admitting: Neurology

## 2014-05-21 VITALS — BP 144/72 | HR 78 | Resp 14 | Ht 65.5 in | Wt 234.4 lb

## 2014-05-21 DIAGNOSIS — F988 Other specified behavioral and emotional disorders with onset usually occurring in childhood and adolescence: Secondary | ICD-10-CM | POA: Insufficient documentation

## 2014-05-21 DIAGNOSIS — G35 Multiple sclerosis: Secondary | ICD-10-CM | POA: Diagnosis not present

## 2014-05-21 DIAGNOSIS — M5000 Cervical disc disorder with myelopathy, unspecified cervical region: Secondary | ICD-10-CM

## 2014-05-21 DIAGNOSIS — F909 Attention-deficit hyperactivity disorder, unspecified type: Secondary | ICD-10-CM

## 2014-05-21 DIAGNOSIS — R4189 Other symptoms and signs involving cognitive functions and awareness: Secondary | ICD-10-CM

## 2014-05-21 DIAGNOSIS — H469 Unspecified optic neuritis: Secondary | ICD-10-CM | POA: Diagnosis not present

## 2014-05-21 DIAGNOSIS — R5383 Other fatigue: Secondary | ICD-10-CM

## 2014-05-21 DIAGNOSIS — Z79899 Other long term (current) drug therapy: Secondary | ICD-10-CM | POA: Diagnosis not present

## 2014-05-21 DIAGNOSIS — E559 Vitamin D deficiency, unspecified: Secondary | ICD-10-CM | POA: Diagnosis not present

## 2014-05-21 MED ORDER — DEXTROAMPHETAMINE SULFATE ER 10 MG PO CP24: 20.0000 mg | ORAL_CAPSULE | Freq: Every day | ORAL | Status: DC

## 2014-05-21 NOTE — Progress Notes (Signed)
[facility]  PATIENT: Cathy [name] DOB: [birth year]  REFERRING DOCTOR OR PCP:  Tiana Loft  SOURCE: patient and medical records available  _________________________________   HISTORICAL  CHIEF COMPLAINT:  Chief Complaint  Patient presents with  . Multiple Sclerosis    Sts. she tolerates Gilenya well.  She denies new or worsening sx./fim    HISTORY OF PRESENT ILLNESS:  Cathy [name] is a 49 yo woman who was diagnosed with MS after presenting with optic neuritis x 2 in 2009.   She had an MRI and LP and was diagnosed with MS.     She started on Copaxone and switched to [place] in 2013.    She denies any further exacerbation since the optic neuritis but was told she had MS plaques on her spine when she presented last year with a cervical radiculopathy requiring surgery.    I have seen her the past year at [facility].   Her last MRI's were July 2015.  She has NIDDM, HTN and high cholesterol.  Gait/strength/sensation:  Gait does well with rare stumbles but she feels balance ok in genersl.  Her knees buckle now and then but she denies any weakness or difficulty with gait.  No numbness or dysesthesia.  She denies spasticity.  Bladder:  She is doing well with minimal frequency.  She has nocturia once a night  Vision:   She notes some color desaturation out of her right eye but visual acuity is fine.  No eye pain or diplopia.     Fatigue/sleep: She notes physical more than mental fatigue. This gets worse as the day goes on and is much worse if the temperatures are hot. She has had some benefit with her fatigue when she takes Dexedrine 20 mg twice a day. She sleeps fairly well at night.  Mood/cognition:    She denies any depression or anxiety. She does note some cognitive issues. Specifically she has some problems with verbal fluency, parallel processing, processing speed, executive function and focus/attention. She notes that some of her cognitive problems  have improved on the Dexedrine.  REVIEW OF SYSTEMS: Constitutional: No fevers, chills, sweats, or change in appetite Eyes: No visual changes, double vision, eye pain Ear, nose and throat: No hearing loss, ear pain, nasal congestion, sore throat Cardiovascular: No chest pain, palpitations Respiratory: No shortness of breath at rest or with exertion.   No wheezes GastrointestinaI: No nausea, vomiting, diarrhea, abdominal pain, fecal incontinence Genitourinary: No dysuria, urinary retention or frequency.  No nocturia. Musculoskeletal: No neck pain, back pain Integumentary: No rash, pruritus, skin lesions Neurological: as above Psychiatric: No depression at this time.  No anxiety Endocrine: No palpitations, diaphoresis, change in appetite, change in weigh or increased thirst Hematologic/Lymphatic: No anemia, purpura, petechiae. Allergic/Immunologic: No itchy/runny eyes, nasal congestion, recent allergic reactions, rashes  ALLERGIES: Allergies  Allergen Reactions  . Aspirin     Heartburn  . Ibuprofen     heatburn    HOME MEDICATIONS:  Current outpatient prescriptions:  .  albuterol (PROVENTIL) (2.5 MG/3ML) 0.083% nebulizer solution, , Disp: , Rfl: 3 .  atorvastatin (LIPITOR) 80 MG tablet, , Disp: , Rfl: 11 .  Blood Glucose Monitoring Suppl (ONE TOUCH ULTRA MINI) W/DEVICE KIT, Use as instructed, Disp: , Rfl:  .  Cholecalciferol (VITAMIN D) 2000 UNITS CAPS, Take 2,000 Units by mouth daily., Disp: , Rfl:  .  dextroamphetamine (DEXEDRINE) 10 MG 24 hr capsule, Take 20-40 mg by mouth daily., Disp: , Rfl:  .  Flunisolide  HFA 80 MCG/ACT AERS, Inhale into the lungs., Disp: , Rfl:  .  GILENYA 0.5 MG CAPS, TAKE ONE CAPSULE (0.5 MG) BY MOUTH EACH DAY. MAY TAKE WITH OR WITHOUTFOOD. STORE AT ROOM TEMPERATURE., Disp: 30 capsule, Rfl: 0 .  hydrochlorothiazide (HYDRODIURIL) 12.5 MG tablet, , Disp: , Rfl: 2 .  HYDROcodone-acetaminophen (NORCO/VICODIN) 5-325 MG per tablet, Take 1 tablet by mouth  every 6 (six) hours as needed for pain., Disp: 15 tablet, Rfl: 0 .  loratadine (CLARITIN) 10 MG tablet, Take 10 mg by mouth daily., Disp: , Rfl:  .  losartan (COZAAR) 50 MG tablet, , Disp: , Rfl: 11 .  losartan-hydrochlorothiazide (HYZAAR) 50-12.5 MG per tablet, Take by mouth., Disp: , Rfl:  .  metFORMIN (GLUCOPHAGE) 500 MG tablet, Take one tablet by mouth daily with evening meal, Disp: , Rfl:  .  ONETOUCH DELICA LANCETS FINE MISC, Use as directed, Disp: , Rfl:  .  pantoprazole (PROTONIX) 20 MG tablet, , Disp: , Rfl: 1 .  STUDY MEDICATION, Take 1 tablet by mouth daily., Disp: , Rfl:  .  triamcinolone (KENALOG) 0.1 % cream, APPLY TO THE AFFECTED AREA THREE TIMES DAILY AS NEEDED, Disp: 30 g, Rfl: 1  PAST MEDICAL HISTORY: Past Medical History  Diagnosis Date  . Multiple sclerosis   . Eczema   . Hypertension   . Diabetes mellitus without complication   . Vision abnormalities     PAST SURGICAL HISTORY: Past Surgical History  Procedure Laterality Date  . Tubal ligation    . Carpal tunnel release    . Laser ablation    . Abdominal hysterectomy    . Cervical spine surgery      Dr. Rennis Harding    FAMILY HISTORY: Family History  Problem Relation Age of Onset  . Hypertension Mother   . Diabetes type II Mother   . COPD Father   . Multiple sclerosis Paternal Uncle   . Multiple sclerosis Paternal Grandfather     SOCIAL HISTORY:  History   Social History  . Marital Status: Married    Spouse Name: N/A  . Number of Children: N/A  . Years of Education: N/A   Occupational History  . Not on file.   Social History Main Topics  . Smoking status: Never Smoker   . Smokeless tobacco: Not on file  . Alcohol Use: Yes     Comment: occasionally  . Drug Use: No  . Sexual Activity: Yes    Birth Control/ Protection: Surgical   Other Topics Concern  . Not on file   Social History Narrative     PHYSICAL EXAM  Filed Vitals:   05/21/14 1102  BP: 144/72  Pulse: 78  Resp: 14    Height: 5' 5.5" (1.664 m)  Weight: 234 lb 6.4 oz (106.323 kg)    Body mass index is 38.4 kg/(m^2).   General: The patient is well-developed and well-nourished and in no acute distress  Eyes:  Funduscopic exam shows normal optic discs and retinal vessels.  Neck: The neck is supple, no carotid bruits are noted.  The neck is nontender.  Cardiovascular: The heart has a regular rate and rhythm with a normal S1 and S2. There were no murmurs, gallops or rubs. Lungs are clear to auscultation.  Skin: Extremities are without significant edema.  Musculoskeletal:  Back is nontender  Neurologic Exam  Mental status: The patient is alert and oriented x 3 at the time of the examination. The patient has apparent normal recent and remote  memory, with an apparently normal attention span and concentration ability.   Speech is normal.  Cranial nerves: Extraocular movements are full. Pupils are equal, round, and reactive to light and accomodation.  Visual fields are full.  Facial symmetry is present. There is good facial sensation to soft touch bilaterally.Facial strength is normal.  Trapezius and sternocleidomastoid strength is normal. No dysarthria is noted.  The tongue is midline, and the patient has symmetric elevation of the soft palate. No obvious hearing deficits are noted.  Motor:  Muscle bulk is normal.   Tone is normal. Strength is  5 / 5 in all 4 extremities.   Sensory: Sensory testing is intact to pinprick, soft touch and vibration sensation in all 4 extremities.  Coordination: Cerebellar testing reveals good finger-nose-finger and heel-to-shin bilaterally.  Gait and station: Station is normal.   Gait is normal. Tandem gait is normal. Romberg is negative.   Reflexes: Deep tendon reflexes are symmetric and normal bilaterally.   Plantar responses are flexor.    DIAGNOSTIC DATA (LABS, IMAGING, TESTING) - I reviewed patient records, labs, notes, testing and imaging myself where  available.     ASSESSMENT AND PLAN  Multiple sclerosis - Plan: Hepatic function panel, CBC with Differential, Vit D  25 hydroxy (rtn osteoporosis monitoring)  Optic neuritis, right  Cervical disc disease with myelopathy  High risk medication use - Plan: Hepatic function panel, CBC with Differential  Vitamin D deficiency - Plan: Vit D  25 hydroxy (rtn osteoporosis monitoring)  Other fatigue  Disturbed cognition  Attention deficit disorder   In summary, Cathy [name] is a 49 year old woman with multiple sclerosis. She has done fairly well on Gilenya therapy not having any definite exacerbations over the past 2 years. I will check blood work to assess the level on lymphopenia and to rule out liver toxicity. She will need to have an MRI of the brain later this year to assess subclinical progression. If present, we would consider a change in therapy. Check a vitamin D level as she was deficient in the past she will continue her medications including Dexedrine. Her neck and arm issues improved after her cervical spine surgery last year.  She will return to see me in 6 months or sooner if there are new or worsening neurologic symptoms.  40 minute face-to-face interaction with greater than 50% of the time counseling and coordinating care about her multiple sclerosis.   Richard A. Felecia Shelling, MD, PhD 1/61/0960, 45:40 AM Certified in Neurology, Clinical Neurophysiology, Sleep Medicine, Pain Medicine and Neuroimaging  [facility] [address] [place], [place] [address] [phone]

## 2014-05-22 ENCOUNTER — Telehealth: Payer: Self-pay | Admitting: *Deleted

## 2014-05-22 LAB — CBC WITH DIFFERENTIAL/PLATELET
BASOS ABS: 0 10*3/uL (ref 0.0–0.2)
Basos: 0 %
EOS (ABSOLUTE): 0.1 10*3/uL (ref 0.0–0.4)
Eos: 1 %
HEMOGLOBIN: 11.5 g/dL (ref 11.1–15.9)
Hematocrit: 34.1 % (ref 34.0–46.6)
IMMATURE GRANS (ABS): 0 10*3/uL (ref 0.0–0.1)
Immature Granulocytes: 0 %
Lymphocytes Absolute: 0.4 10*3/uL — ABNORMAL LOW (ref 0.7–3.1)
Lymphs: 8 %
MCH: 28.7 pg (ref 26.6–33.0)
MCHC: 33.7 g/dL (ref 31.5–35.7)
MCV: 85 fL (ref 79–97)
MONOS ABS: 0.3 10*3/uL (ref 0.1–0.9)
Monocytes: 6 %
NEUTROS PCT: 85 %
Neutrophils Absolute: 4.5 10*3/uL (ref 1.4–7.0)
PLATELETS: 344 10*3/uL (ref 150–379)
RBC: 4.01 x10E6/uL (ref 3.77–5.28)
RDW: 15.1 % (ref 12.3–15.4)
WBC: 5.3 10*3/uL (ref 3.4–10.8)

## 2014-05-22 LAB — HEPATIC FUNCTION PANEL
ALT: 19 IU/L (ref 0–32)
AST: 18 IU/L (ref 0–40)
Albumin: 4 g/dL (ref 3.5–5.5)
Alkaline Phosphatase: 127 IU/L — ABNORMAL HIGH (ref 39–117)
Bilirubin Total: 0.5 mg/dL (ref 0.0–1.2)
Bilirubin, Direct: 0.14 mg/dL (ref 0.00–0.40)
TOTAL PROTEIN: 6.3 g/dL (ref 6.0–8.5)

## 2014-05-22 LAB — VITAMIN D 25 HYDROXY (VIT D DEFICIENCY, FRACTURES): VIT D 25 HYDROXY: 69.9 ng/mL (ref 30.0–100.0)

## 2014-05-22 NOTE — Telephone Encounter (Signed)
LMTC./fim 

## 2014-05-22 NOTE — Telephone Encounter (Signed)
-----   Message from Richard A Sater, MD sent at 05/22/2014  8:47 AM EDT ----- Labs are ok.    Vit D is normal     Lymphocytes are low as expected for Gilenya 

## 2014-05-22 NOTE — Telephone Encounter (Signed)
-----   Message from Asa Lente, MD sent at 05/22/2014  8:47 AM EDT ----- Labs are ok.    Vit D is normal     Lymphocytes are low as expected for Gilenya

## 2014-05-22 NOTE — Telephone Encounter (Signed)
Patient returned call. Please call and advise.  °

## 2014-05-23 NOTE — Telephone Encounter (Signed)
Cathy [name] and I seem to be playing phone tag, so I have lmom that labs are ok--vit. d level is normal, lymphocytes are a normal low on gilenya, not low enough to cause concern.  She does not need to return this call unless she has questions/concerns/fim

## 2014-05-23 NOTE — Telephone Encounter (Signed)
-----   Message from Richard A Sater, MD sent at 05/22/2014  8:47 AM EDT ----- Labs are ok.    Vit D is normal     Lymphocytes are low as expected for Gilenya 

## 2014-09-15 ENCOUNTER — Other Ambulatory Visit: Payer: Self-pay

## 2014-09-15 ENCOUNTER — Telehealth: Payer: Self-pay | Admitting: Neurology

## 2014-09-15 MED ORDER — DEXTROAMPHETAMINE SULFATE ER 10 MG PO CP24: 20.0000 mg | ORAL_CAPSULE | Freq: Every day | ORAL | Status: DC

## 2014-09-15 NOTE — Telephone Encounter (Signed)
Patient called requesting prior auth for GILENYA 0.5 MG CAPS . This needs to go to [employer]. She also needs refill fordextroamphetamine (DEXEDRINE) 10 MG 24 hr capsule and requesting this med be mailed to her. She can be reached at [phone].

## 2014-09-15 NOTE — Telephone Encounter (Signed)
Rx request sent to provider for approval.  I called the patient to advise we are unable to mail Rx's per policy.  Got no answer.  Left message.  Ins has been contacted and provided with clinical info.  Request for Gilenya is currently under review Ref # [id number]

## 2014-09-16 ENCOUNTER — Encounter: Payer: Self-pay | Admitting: *Deleted

## 2014-09-16 NOTE — Progress Notes (Signed)
Dexedrine rx. up front [facility]/fim 

## 2014-09-16 NOTE — Telephone Encounter (Signed)
[employer] FEP has approved the request for coverage on Gilenya effective until 09/15/2015.  Ref ID# [id number]

## 2014-10-29 ENCOUNTER — Other Ambulatory Visit: Payer: Self-pay

## 2014-10-29 MED ORDER — FINGOLIMOD HCL 0.5 MG PO CAPS: ORAL_CAPSULE | ORAL | Status: DC

## 2014-11-20 ENCOUNTER — Ambulatory Visit: Payer: Federal, State, Local not specified - PPO | Admitting: Neurology

## 2014-11-25 ENCOUNTER — Ambulatory Visit (INDEPENDENT_AMBULATORY_CARE_PROVIDER_SITE_OTHER): Payer: Federal, State, Local not specified - PPO | Admitting: Neurology

## 2014-11-25 ENCOUNTER — Encounter: Payer: Self-pay | Admitting: Neurology

## 2014-11-25 VITALS — BP 130/70 | HR 68 | Resp 14 | Ht 65.5 in | Wt 234.6 lb

## 2014-11-25 DIAGNOSIS — M4802 Spinal stenosis, cervical region: Secondary | ICD-10-CM | POA: Diagnosis not present

## 2014-11-25 DIAGNOSIS — G35 Multiple sclerosis: Secondary | ICD-10-CM | POA: Diagnosis not present

## 2014-11-25 DIAGNOSIS — H469 Unspecified optic neuritis: Secondary | ICD-10-CM

## 2014-11-25 DIAGNOSIS — R5383 Other fatigue: Secondary | ICD-10-CM

## 2014-11-25 DIAGNOSIS — R4189 Other symptoms and signs involving cognitive functions and awareness: Secondary | ICD-10-CM | POA: Diagnosis not present

## 2014-11-25 DIAGNOSIS — F909 Attention-deficit hyperactivity disorder, unspecified type: Secondary | ICD-10-CM | POA: Diagnosis not present

## 2014-11-25 DIAGNOSIS — Z79899 Other long term (current) drug therapy: Secondary | ICD-10-CM | POA: Diagnosis not present

## 2014-11-25 DIAGNOSIS — E119 Type 2 diabetes mellitus without complications: Secondary | ICD-10-CM

## 2014-11-25 DIAGNOSIS — F988 Other specified behavioral and emotional disorders with onset usually occurring in childhood and adolescence: Secondary | ICD-10-CM

## 2014-11-25 MED ORDER — TRIAMCINOLONE ACETONIDE 0.1 % EX CREA: TOPICAL_CREAM | Freq: Three times a day (TID) | CUTANEOUS | Status: DC

## 2014-11-25 MED ORDER — DEXTROAMPHETAMINE SULFATE ER 10 MG PO CP24: 20.0000 mg | ORAL_CAPSULE | Freq: Every day | ORAL | Status: DC

## 2014-11-25 MED ORDER — FINGOLIMOD HCL 0.5 MG PO CAPS: ORAL_CAPSULE | ORAL | Status: DC

## 2014-11-25 NOTE — Progress Notes (Signed)
[facility]  PATIENT: Cathy [name] DOB: Nov 29, 1965  REFERRING DOCTOR OR PCP:  Synetta Fail  SOURCE: patient and medical records available  _________________________________   HISTORICAL  CHIEF COMPLAINT:  Chief Complaint  Patient presents with  . Multiple Sclerosis    Sts. she continues to tolerate Gilenya well.  Sts. she feels wt. has slowly increased since she started Gilenya and wonders if Gilenya may be the cause/fim    HISTORY OF PRESENT ILLNESS:  Cathy [name] is a 49 yo woman with MS.   She has been on Gilenya since 2013 and is tolerating it well.   She denies any exacerbation but people have told her that she drags a foot and gait seems slightly off every now and then.    who was diagnosed with MS after presenting with optic neuritis x 2 in 2009.   She had an MRI and LP and was diagnosed with MS.     She started on Copaxone and switched to Gilenya in 2013.    She denies any further exacerbation since the optic neuritis but was told she had MS plaques on her spine when she presented last year with a cervical radiculopathy requiring surgery.    I have seen her the past year at [facility].   Her last MRI's were July 2015.  She has NIDDM, HTN and high cholesterol.  Gait/strength/sensation:  She feels gait is good and she very rarely noted that a knee gives out.   She has not fallen.   She notes mild stiffness.   No numbness or dysesthesia.  She denies definitespasticity but feels slightly stiff sometimes if she sits a while.    Her left shoulder sometimes seems stiff if she tries to raise arm.     Bladder:  She is doing well with minimal frequency.  She has nocturia once a night  Vision:   She had right optic neuritis in the past.  She notes right color desaturation but visual acuity is fine.  No eye pain or diplopia.     Fatigue/sleep: She notes fatigue. This gets worse as the day goes on and is much worse if the temperatures are hot. She has  benefit with  Dexedrine 20 mg twice a day. She sleeps fairly well at night now but had insomnia last year.   Mood/cognition:    She denies any depression or anxiety. She notes mild cognitive issues. Specifically she has some mild problems with verbal fluency and processing speed and focus/attention. She notes that cognitive problems have improved on the Dexedrine.  REVIEW OF SYSTEMS: Constitutional: No fevers, chills, sweats, or change in appetite.   Has fatigue Eyes: H/o right ON with residual color desaturation Ear, nose and throat: No hearing loss, ear pain, nasal congestion, sore throat Cardiovascular: No chest pain, palpitations Respiratory: No shortness of breath at rest or with exertion.   No wheezes GastrointestinaI: No nausea, vomiting, diarrhea, abdominal pain, fecal incontinence Genitourinary: No dysuria, urinary retention or frequency.  No nocturia. Musculoskeletal: No neck pain, back pain Integumentary: No rash, pruritus, skin lesions Neurological: as above Psychiatric: No depression at this time.  No anxiety Endocrine: has NIDDM.  No palpitations, diaphoresis, change in appetite, change in weigh or increased thirst Hematologic/Lymphatic: No anemia, purpura, petechiae. Allergic/Immunologic: No itchy/runny eyes, nasal congestion, recent allergic reactions, rashes  ALLERGIES: Allergies  Allergen Reactions  . Aspirin     Heartburn  . Ibuprofen     heatburn    HOME MEDICATIONS:  Current  outpatient prescriptions:  .  albuterol (PROVENTIL) (2.5 MG/3ML) 0.083% nebulizer solution, , Disp: , Rfl: 3 .  atorvastatin (LIPITOR) 80 MG tablet, , Disp: , Rfl: 11 .  Cholecalciferol (VITAMIN D) 2000 UNITS CAPS, Take 2,000 Units by mouth daily., Disp: , Rfl:  .  dextroamphetamine (DEXEDRINE) 10 MG 24 hr capsule, Take 2-4 capsules (20-40 mg total) by mouth daily., Disp: 120 capsule, Rfl: 0 .  Fingolimod HCl (GILENYA) 0.5 MG CAPS, TAKE ONE CAPSULE (0.5 MG) BY MOUTH EACH DAY. MAY TAKE  WITH OR WITHOUTFOOD. STORE AT ROOM TEMPERATURE., Disp: 30 capsule, Rfl: 1 .  Flunisolide HFA 80 MCG/ACT AERS, Inhale into the lungs., Disp: , Rfl:  .  hydrochlorothiazide (HYDRODIURIL) 12.5 MG tablet, , Disp: , Rfl: 2 .  HYDROcodone-acetaminophen (NORCO/VICODIN) 5-325 MG per tablet, Take 1 tablet by mouth every 6 (six) hours as needed for pain., Disp: 15 tablet, Rfl: 0 .  loratadine (CLARITIN) 10 MG tablet, Take 10 mg by mouth daily., Disp: , Rfl:  .  losartan-hydrochlorothiazide (HYZAAR) 50-12.5 MG per tablet, Take by mouth., Disp: , Rfl:  .  metFORMIN (GLUCOPHAGE-XR) 500 MG 24 hr tablet, Take 500 mg by mouth., Disp: , Rfl:  .  ONETOUCH DELICA LANCETS FINE MISC, Use as directed, Disp: , Rfl:  .  losartan (COZAAR) 50 MG tablet, , Disp: , Rfl: 11 .  pantoprazole (PROTONIX) 20 MG tablet, , Disp: , Rfl: 1 .  STUDY MEDICATION, Take 1 tablet by mouth daily., Disp: , Rfl:  .  triamcinolone (KENALOG) 0.1 % cream, APPLY TO THE AFFECTED AREA THREE TIMES DAILY AS NEEDED (Patient not taking: Reported on 11/25/2014), Disp: 30 g, Rfl: 1  PAST MEDICAL HISTORY: Past Medical History  Diagnosis Date  . Multiple sclerosis (HCC)   . Eczema   . Hypertension   . Diabetes mellitus without complication (HCC)   . Vision abnormalities     PAST SURGICAL HISTORY: Past Surgical History  Procedure Laterality Date  . Tubal ligation    . Carpal tunnel release    . Laser ablation    . Abdominal hysterectomy    . Cervical spine surgery      Dr. Sharolyn Douglas    FAMILY HISTORY: Family History  Problem Relation Age of Onset  . Hypertension Mother   . Diabetes type II Mother   . COPD Father   . Multiple sclerosis Paternal Uncle   . Multiple sclerosis Paternal Grandfather     SOCIAL HISTORY:  Social History   Social History  . Marital Status: Married    Spouse Name: N/A  . Number of Children: N/A  . Years of Education: N/A   Occupational History  . Not on file.   Social History Main Topics  . Smoking  status: Never Smoker   . Smokeless tobacco: Not on file  . Alcohol Use: Yes     Comment: occasionally  . Drug Use: No  . Sexual Activity: Yes    Birth Control/ Protection: Surgical   Other Topics Concern  . Not on file   Social History Narrative     PHYSICAL EXAM  Filed Vitals:   11/25/14 1519  BP: 130/70  Pulse: 68  Resp: 14  Height: 5' 5.5" (1.664 m)  Weight: 234 lb 9.6 oz (106.414 kg)    Body mass index is 38.43 kg/(m^2).   General: The patient is well-developed and well-nourished and in no acute distress  Eyes:  Funduscopic exam shows normal optic discs and retinal vessels.  Neck: The neck  is supple, no carotid bruits are noted.  The neck is nontender.  Cardiovascular: The heart has a regular rate and rhythm with a normal S1 and S2. There were no murmurs, gallops or rubs. Lungs are clear to auscultation.  Skin: Extremities are without significant edema.  Musculoskeletal:  Back is nontender  Neurologic Exam  Mental status: The patient is alert and oriented x 3 at the time of the examination. The patient has apparent normal recent and remote memory, with an apparently normal attention span and concentration ability.   Speech is normal.  Cranial nerves: Extraocular movements are full. Pupils are equal, round, and reactive to light and accomodation.  Visual fields are full.  Facial symmetry is present. There is good facial sensation to soft touch bilaterally.Facial strength is normal.  Trapezius and sternocleidomastoid strength is normal. No dysarthria is noted.  The tongue is midline, and the patient has symmetric elevation of the soft palate. No obvious hearing deficits are noted.  Motor:  Muscle bulk is normal.   Tone is normal. Strength is  5 / 5 in all 4 extremities.   Sensory: Sensory testing is intact to pinprick, soft touch and vibration sensation in all 4 extremities.  Coordination: Cerebellar testing reveals good finger-nose-finger and heel-to-shin  bilaterally.  Gait and station: Station is normal.   Gait is normal. Tandem gait is normal. Romberg is negative.   Reflexes: Deep tendon reflexes are symmetric and normal bilaterally.   Plantar responses are flexor.    DIAGNOSTIC DATA (LABS, IMAGING, TESTING) - I reviewed patient records, labs, notes, testing and imaging myself where available.     ASSESSMENT AND PLAN  Multiple sclerosis (HCC) - Plan: MR Brain W Wo Contrast, Amb ref to Medical Nutrition Therapy-MNT  Other fatigue  High risk medication use  Disturbed cognition  Cervical spinal stenosis  Attention deficit disorder  Type 2 diabetes mellitus without complication, without long-term current use of insulin (HCC) - Plan: Amb ref to Medical Nutrition Therapy-MNT  Optic neuritis, right   1.  Continue Gilenya. 2.  Check MRI brain to assess for subclinical progression/breakthrough.  If present, consider a change to another medication 3.   Renew Dexedrine for MS fatigue and attention deficit 4.   Refer to Nutrition for help with weight loss / diabetes  rtc 6 months but call sooner if any new or worsening neurologic issues  Richard A. Epimenio Foot, MD, PhD 11/25/2014, 3:30 PM Certified in Neurology, Clinical Neurophysiology, Sleep Medicine, Pain Medicine and Neuroimaging  [facility] [address] [place], [place] [address] [phone]

## 2014-12-31 ENCOUNTER — Telehealth: Payer: Self-pay | Admitting: *Deleted

## 2014-12-31 ENCOUNTER — Other Ambulatory Visit: Payer: Self-pay | Admitting: Neurology

## 2014-12-31 MED ORDER — DEXTROAMPHETAMINE SULFATE ER 10 MG PO CP24: 20.0000 mg | ORAL_CAPSULE | Freq: Every day | ORAL | Status: DC

## 2014-12-31 NOTE — Telephone Encounter (Signed)
Request entered, forwarded to provider for approval.  

## 2014-12-31 NOTE — Telephone Encounter (Signed)
LVM for pt. Rx dexedrine ready to pick up in office. Told her to bring valid photo ID w/ her.

## 2014-12-31 NOTE — Telephone Encounter (Signed)
Pt called requested refill for dextroamphetamine (DEXEDRINE) 10 MG 24 hr capsule . Pt advised RX will be ready within 24hrs unless otherwise informed by RN

## 2015-01-21 ENCOUNTER — Ambulatory Visit: Payer: Federal, State, Local not specified - PPO | Admitting: *Deleted

## 2015-01-21 ENCOUNTER — Encounter (INDEPENDENT_AMBULATORY_CARE_PROVIDER_SITE_OTHER): Payer: Self-pay

## 2015-01-21 ENCOUNTER — Ambulatory Visit (INDEPENDENT_AMBULATORY_CARE_PROVIDER_SITE_OTHER): Payer: Federal, State, Local not specified - PPO

## 2015-01-21 DIAGNOSIS — G35 Multiple sclerosis: Secondary | ICD-10-CM | POA: Diagnosis not present

## 2015-01-22 MED ORDER — GADOPENTETATE DIMEGLUMINE 469.01 MG/ML IV SOLN: 20.0000 mL | Freq: Once | INTRAVENOUS | Status: DC | PRN

## 2015-01-27 ENCOUNTER — Telehealth: Payer: Self-pay | Admitting: *Deleted

## 2015-01-27 NOTE — Telephone Encounter (Signed)
-----   Message from Asa Lente, MD sent at 01/26/2015 11:23 AM EST ----- Please let her know that the MRI does not show anything brand new.We need to get the MRI from [facility] for comparison.

## 2015-01-27 NOTE — Telephone Encounter (Signed)
LMTC.  I have requested old mri brain and c-spine, if one was done, from [facility]/fim

## 2015-01-28 ENCOUNTER — Telehealth: Payer: Self-pay | Admitting: *Deleted

## 2015-01-28 NOTE — Telephone Encounter (Signed)
Patient returned Faith's call, please call back to cell# [phone], states this is a secure voicemail, may leave message. Patient also states that when she had MRI, they found something on her spine and didn't know if this was an oversight or not.

## 2015-01-28 NOTE — Telephone Encounter (Signed)
-----   Message from Richard A Sater, MD sent at 01/26/2015 11:23 AM EST ----- Please let her know that the MRI does not show anything brand new.We need to get the MRI from [facility] for comparison. 

## 2015-01-28 NOTE — Telephone Encounter (Signed)
LMOM that pre RAS, mri did not show anything brand new.  I have requested the last mri on cd (from [facility]), and once RAS has received it and compared it with the mri she just had, I'll call her with a new report/fim

## 2015-01-28 NOTE — Telephone Encounter (Signed)
LMOM that per RAS, mri shows nothing brand new.  I have requested the last mri she had done at [facility] on cd and mailed to Dr. Epimenio Foot. Once he receives it, he will compare it with the most recent mri done and I will call her with a new report./fim

## 2015-03-06 ENCOUNTER — Other Ambulatory Visit: Payer: Self-pay | Admitting: Neurology

## 2015-03-06 NOTE — Telephone Encounter (Signed)
Pt needs refill on dextroamphetamine (DEXEDRINE) 10 MG 24 hr capsule

## 2015-03-08 MED ORDER — DEXTROAMPHETAMINE SULFATE ER 10 MG PO CP24: 20.0000 mg | ORAL_CAPSULE | Freq: Every day | ORAL | Status: DC

## 2015-03-09 ENCOUNTER — Telehealth: Payer: Self-pay | Admitting: *Deleted

## 2015-03-09 NOTE — Telephone Encounter (Signed)
Dexedrine rx. up front [facility]/fim 

## 2015-04-27 ENCOUNTER — Telehealth: Payer: Self-pay | Admitting: Neurology

## 2015-04-27 MED ORDER — DEXTROAMPHETAMINE SULFATE ER 10 MG PO CP24: 20.0000 mg | ORAL_CAPSULE | Freq: Every day | ORAL | Status: DC

## 2015-04-27 NOTE — Telephone Encounter (Signed)
Patient called to request refill of dextroamphetamine (DEXEDRINE) 10 MG 24 hr capsule

## 2015-04-27 NOTE — Telephone Encounter (Signed)
Rx. awaiting RAS sig/fim 

## 2015-04-27 NOTE — Telephone Encounter (Signed)
Dexedrine rx. up front [facility]/fim 

## 2015-05-26 ENCOUNTER — Ambulatory Visit (INDEPENDENT_AMBULATORY_CARE_PROVIDER_SITE_OTHER): Payer: Federal, State, Local not specified - PPO | Admitting: Neurology

## 2015-05-26 ENCOUNTER — Encounter: Payer: Self-pay | Admitting: Neurology

## 2015-05-26 VITALS — BP 124/72 | HR 76 | Resp 16 | Ht 65.5 in | Wt 236.5 lb

## 2015-05-26 DIAGNOSIS — G35 Multiple sclerosis: Secondary | ICD-10-CM | POA: Diagnosis not present

## 2015-05-26 DIAGNOSIS — G4700 Insomnia, unspecified: Secondary | ICD-10-CM | POA: Diagnosis not present

## 2015-05-26 DIAGNOSIS — F909 Attention-deficit hyperactivity disorder, unspecified type: Secondary | ICD-10-CM

## 2015-05-26 DIAGNOSIS — Z79899 Other long term (current) drug therapy: Secondary | ICD-10-CM | POA: Diagnosis not present

## 2015-05-26 DIAGNOSIS — M4802 Spinal stenosis, cervical region: Secondary | ICD-10-CM | POA: Diagnosis not present

## 2015-05-26 DIAGNOSIS — R5383 Other fatigue: Secondary | ICD-10-CM

## 2015-05-26 DIAGNOSIS — H469 Unspecified optic neuritis: Secondary | ICD-10-CM | POA: Diagnosis not present

## 2015-05-26 DIAGNOSIS — F988 Other specified behavioral and emotional disorders with onset usually occurring in childhood and adolescence: Secondary | ICD-10-CM

## 2015-05-26 MED ORDER — DEXTROAMPHETAMINE SULFATE ER 10 MG PO CP24: 20.0000 mg | ORAL_CAPSULE | Freq: Every day | ORAL | Status: DC

## 2015-05-26 NOTE — Progress Notes (Signed)
[facility]  PATIENT: Cathy [name] DOB: [birth year]  REFERRING DOCTOR OR PCP:  Synetta Fail  SOURCE: patient and medical records available  _________________________________   HISTORICAL  CHIEF COMPLAINT:  Chief Complaint  Patient presents with  . Multiple Sclerosis    HISTORY OF PRESENT ILLNESS:  Cathy [name] is a 50 yo woman with MS.   She has been on Gilenya since 2013 and is tolerating it well.   She denies any exacerbation.     I personally reviewed the MRI images from 01/22/2015. The MRI is consistent with MS and there are no acute findings.    She denies any major new symptom but notes she limps with her right leg, maybe a bit more  Gait/strength/sensation:  She feels gait is good but friends say she has a right limp.     She has not fallen.      No numbness or dysesthesia.  She denies definite spasticity but feels slightly stiff sometimes if she sits a while.    Her left shoulder sometimes seems stiff if she tries to raise arm but arm is not stiff.         Bladder:  She is doing well with minimal frequency.  She has nocturia once a night  Vision:   She had right optic neuritis in the past and notes right color desaturation and slightly reduced visual acuity.   She feels this is stable.  No eye pain or diplopia.     Fatigue/sleep: She notes fatigue,  worse as the day goes on and is much worse if the temperatures are hot. She has benefit with Dexedrine 20 mg twice a day which helps her until the evening. She sleeps fairly well at night now but had insomnia last year.   Mood/cognition:    She denies any depression or anxiety. She notes mild cognitive issues, especially reduced attention, mild problems with verbal fluency and processing speed. She notes that cognitive problems have improved on the Dexedrine.  MS History:   She was diagnosed with MS after presenting with optic neuritis x 2 in 2009.   She had an MRI and LP and was diagnosed with MS.      She started on Copaxone and switched to Gilenya in 2013.    She denies any further exacerbation since the optic neuritis but was told she had MS plaques on her spine when she presented last year with a cervical radiculopathy requiring surgery.    I have seen her the past year at [facility].   Her last MRI's were Jan 21, 2014.  MRI images show foci in the cerebellum and the hemispheres consistent with MS. There were no acute findings. She has NIDDM, HTN and high cholesterol.    REVIEW OF SYSTEMS: Constitutional: No fevers, chills, sweats, or change in appetite.   Has fatigue Eyes: H/o right ON with residual color desaturation Ear, nose and throat: No hearing loss, ear pain, nasal congestion, sore throat Cardiovascular: No chest pain, palpitations Respiratory: No shortness of breath at rest or with exertion.   No wheezes GastrointestinaI: No nausea, vomiting, diarrhea, abdominal pain, fecal incontinence Genitourinary: No dysuria, urinary retention or frequency.  No nocturia. Musculoskeletal: No neck pain, back pain Integumentary: No rash, pruritus, skin lesions Neurological: as above Psychiatric: No depression at this time.  No anxiety Endocrine: has NIDDM.  No palpitations, diaphoresis, change in appetite, change in weigh or increased thirst Hematologic/Lymphatic: No anemia, purpura, petechiae. Allergic/Immunologic: No itchy/runny eyes, nasal congestion,  recent allergic reactions, rashes  ALLERGIES: Allergies  Allergen Reactions  . Aspirin     Heartburn  . Ibuprofen     heatburn    HOME MEDICATIONS:  Current outpatient prescriptions:  .  albuterol (PROVENTIL) (2.5 MG/3ML) 0.083% nebulizer solution, , Disp: , Rfl: 3 .  atorvastatin (LIPITOR) 80 MG tablet, , Disp: , Rfl: 11 .  Cholecalciferol (VITAMIN D) 2000 UNITS CAPS, Take 2,000 Units by mouth daily., Disp: , Rfl:  .  dextroamphetamine (DEXEDRINE) 10 MG 24 hr capsule, Take 2-4 capsules (20-40 mg total) by mouth  daily., Disp: 120 capsule, Rfl: 0 .  Fingolimod HCl (GILENYA) 0.5 MG CAPS, TAKE ONE CAPSULE (0.5 MG) BY MOUTH EACH DAY. MAY TAKE WITH OR WITHOUTFOOD. STORE AT ROOM TEMPERATURE., Disp: 90 capsule, Rfl: 3 .  hydrochlorothiazide (HYDRODIURIL) 12.5 MG tablet, , Disp: , Rfl: 2 .  HYDROcodone-acetaminophen (NORCO/VICODIN) 5-325 MG per tablet, Take 1 tablet by mouth every 6 (six) hours as needed for pain., Disp: 15 tablet, Rfl: 0 .  loratadine (CLARITIN) 10 MG tablet, Take 10 mg by mouth daily., Disp: , Rfl:  .  losartan (COZAAR) 50 MG tablet, , Disp: , Rfl: 11 .  losartan-hydrochlorothiazide (HYZAAR) 50-12.5 MG per tablet, Take by mouth., Disp: , Rfl:  .  metFORMIN (GLUCOPHAGE-XR) 500 MG 24 hr tablet, Take 500 mg by mouth., Disp: , Rfl:  .  ONETOUCH DELICA LANCETS FINE MISC, Use as directed, Disp: , Rfl:  .  pantoprazole (PROTONIX) 20 MG tablet, , Disp: , Rfl: 1 .  triamcinolone cream (KENALOG) 0.1 %, Apply topically 3 (three) times daily., Disp: 45 g, Rfl: 5 No current facility-administered medications for this visit.  Facility-Administered Medications Ordered in Other Visits:  .  gadopentetate dimeglumine (MAGNEVIST) injection 20 mL, 20 mL, Intravenous, Once PRN, Asa Lente, MD  PAST MEDICAL HISTORY: Past Medical History  Diagnosis Date  . Multiple sclerosis (HCC)   . Eczema   . Hypertension   . Diabetes mellitus without complication (HCC)   . Vision abnormalities     PAST SURGICAL HISTORY: Past Surgical History  Procedure Laterality Date  . Tubal ligation    . Carpal tunnel release    . Laser ablation    . Abdominal hysterectomy    . Cervical spine surgery      Dr. Sharolyn Douglas    FAMILY HISTORY: Family History  Problem Relation Age of Onset  . Hypertension Mother   . Diabetes type II Mother   . COPD Father   . Multiple sclerosis Paternal Uncle   . Multiple sclerosis Paternal Grandfather     SOCIAL HISTORY:  Social History   Social History  . Marital Status: Married     Spouse Name: N/A  . Number of Children: N/A  . Years of Education: N/A   Occupational History  . Not on file.   Social History Main Topics  . Smoking status: Never Smoker   . Smokeless tobacco: Not on file  . Alcohol Use: Yes     Comment: occasionally  . Drug Use: No  . Sexual Activity: Yes    Birth Control/ Protection: Surgical   Other Topics Concern  . Not on file   Social History Narrative     PHYSICAL EXAM  There were no vitals filed for this visit.  There is no weight on file to calculate BMI.   General: The patient is well-developed and well-nourished and in no acute distress  Eyes:  Funduscopic exam shows normal optic discs and  retinal vessels.  Neck: The neck is supple, no carotid bruits are noted.  The neck is nontender.  Cardiovascular: The heart has a regular rate and rhythm with a normal S1 and S2. There were no murmurs, gallops or rubs. Lungs are clear to auscultation.  Skin: Extremities are without significant edema.  Musculoskeletal:  Back is nontender  Neurologic Exam  Mental status: The patient is alert and oriented x 3 at the time of the examination. The patient has apparent normal recent and remote memory, with an apparently normal attention span and concentration ability.   Speech is normal.  Cranial nerves: Extraocular movements are full. She reports reduced color saturation out of the right eye. Facial symmetry is present. There is good facial sensation to soft touch bilaterally.Facial strength is normal.  Trapezius and sternocleidomastoid strength is normal. No dysarthria is noted.  The tongue is midline, and the patient has symmetric elevation of the soft palate. No obvious hearing deficits are noted.  Motor:  Muscle bulk is normal.   Tone is normal. Strength is  5 / 5 in all 4 extremities.   Sensory: Sensory testing is intact to  soft touch and vibration sensation in all 4 extremities.  Coordination: Cerebellar testing reveals good  finger-nose-finger and heel-to-shin bilaterally.  Gait and station: Station is normal.   Gait is normal. Tandem gait is normal. Romberg is negative.   Reflexes: Deep tendon reflexes are symmetric and normal bilaterally.        DIAGNOSTIC DATA (LABS, IMAGING, TESTING) - I reviewed patient records, labs, notes, testing and imaging myself where available.     ASSESSMENT AND PLAN  Multiple sclerosis (HCC)  Optic neuritis, right  Other fatigue  High risk medication use  Cervical spinal stenosis  Attention deficit disorder  Cannot sleep   1.  Continue Gilenya.   Today, we will check CBC with differential and LFTs to make sure that she does not have severe lymphopenia or hepatotoxicity. 2.  Renew Dexedrine for MS fatigue and attention deficit 3.  Advised to continue to use melatonin for sleep. rtc 6 months but call sooner if any new or worsening neurologic issues  Richard A. Epimenio Foot, MD, PhD 05/26/2015, 8:49 AM Certified in Neurology, Clinical Neurophysiology, Sleep Medicine, Pain Medicine and Neuroimaging  [facility] [address] [place], [place] [address] [phone]

## 2015-05-27 ENCOUNTER — Telehealth: Payer: Self-pay | Admitting: *Deleted

## 2015-05-27 LAB — HEPATIC FUNCTION PANEL
ALBUMIN: 4.1 g/dL (ref 3.5–5.5)
ALK PHOS: 144 IU/L — AB (ref 39–117)
ALT: 23 IU/L (ref 0–32)
AST: 16 IU/L (ref 0–40)
BILIRUBIN TOTAL: 0.6 mg/dL (ref 0.0–1.2)
Bilirubin, Direct: 0.18 mg/dL (ref 0.00–0.40)
Total Protein: 6.6 g/dL (ref 6.0–8.5)

## 2015-05-27 LAB — CBC WITH DIFFERENTIAL/PLATELET
BASOS: 0 %
Basophils Absolute: 0 10*3/uL (ref 0.0–0.2)
EOS (ABSOLUTE): 0.1 10*3/uL (ref 0.0–0.4)
EOS: 2 %
HEMATOCRIT: 36 % (ref 34.0–46.6)
HEMOGLOBIN: 11.8 g/dL (ref 11.1–15.9)
Immature Grans (Abs): 0 10*3/uL (ref 0.0–0.1)
Immature Granulocytes: 0 %
LYMPHS ABS: 0.5 10*3/uL — AB (ref 0.7–3.1)
Lymphs: 12 %
MCH: 28.4 pg (ref 26.6–33.0)
MCHC: 32.8 g/dL (ref 31.5–35.7)
MCV: 87 fL (ref 79–97)
MONOCYTES: 6 %
MONOS ABS: 0.2 10*3/uL (ref 0.1–0.9)
Neutrophils Absolute: 3 10*3/uL (ref 1.4–7.0)
Neutrophils: 80 %
Platelets: 318 10*3/uL (ref 150–379)
RBC: 4.16 x10E6/uL (ref 3.77–5.28)
RDW: 14.3 % (ref 12.3–15.4)
WBC: 3.8 10*3/uL (ref 3.4–10.8)

## 2015-05-27 NOTE — Telephone Encounter (Signed)
-----   Message from Asa Lente, MD sent at 05/27/2015  8:48 AM EDT ----- Labs are ok.     Low lymphocytes expected with Gilenya

## 2015-05-27 NOTE — Telephone Encounter (Signed)
LMOM (identified vm) that per RAS, labs are ok--lymphocytes are a little low, but this is normal with Gilenya.  Not low enough to cause concern; she should continue Gilenya as rx'd.  She does not need to return this call unless she has questions/fim

## 2015-06-02 ENCOUNTER — Telehealth: Payer: Self-pay | Admitting: Neurology

## 2015-06-02 NOTE — Telephone Encounter (Signed)
LMOM (identified vm), that pa for Dexedrine has been approved.  Approval notification faxed to [facility]/fim

## 2015-06-02 NOTE — Telephone Encounter (Signed)
Patient requesting refill of  dextroamphetamine (DEXEDRINE) 10 MG 24 hr capsule . Sts it needs PA from [facility]: [address]

## 2015-07-01 ENCOUNTER — Telehealth: Payer: Self-pay | Admitting: Neurology

## 2015-07-01 MED ORDER — DEXTROAMPHETAMINE SULFATE ER 10 MG PO CP24: 20.0000 mg | ORAL_CAPSULE | Freq: Every day | ORAL | Status: DC

## 2015-07-01 NOTE — Telephone Encounter (Signed)
Rx. awaiting RAS sig/fim 

## 2015-07-01 NOTE — Telephone Encounter (Signed)
Dexedrine rx. up front [facility]/fim

## 2015-07-01 NOTE — Telephone Encounter (Signed)
Patient calling to get a written Rx for dextroamphetamine (DEXEDRINE) 10 MG 24 hr capsule. I advised the Rx will be ready in 24 hours unless the nurse advises otherwise.

## 2015-08-14 ENCOUNTER — Telehealth: Payer: Self-pay | Admitting: Neurology

## 2015-08-14 NOTE — Telephone Encounter (Signed)
Patient requesting refill of  dextroamphetamine (DEXEDRINE) 10 MG 24 hr capsule Pharmacy: Pick up  Pt also needs a new PA started for Fingolimod HCl (GILENYA) 0.5 MG CAPS. It will expire August 21st. Please call pt to update.

## 2015-08-17 MED ORDER — DEXTROAMPHETAMINE SULFATE ER 10 MG PO CP24: 20.0000 mg | ORAL_CAPSULE | Freq: Every day | ORAL | 0 refills | Status: DC

## 2015-08-17 NOTE — Telephone Encounter (Signed)
Rx. awaiting RAS sig/fim 

## 2015-08-17 NOTE — Telephone Encounter (Signed)
Dexedrine rx. up front [facility]/fim

## 2015-08-18 NOTE — Telephone Encounter (Signed)
PA for Gilenya completed by phone and has been approved.  Effective dates 06-19-15 thru 08-18-15.  No PA # given/fim

## 2015-08-20 NOTE — Telephone Encounter (Signed)
Gilenya PA approved by [employer] F[employer] [phone]) through 08/17/16 - pt CV#[id number].

## 2015-09-03 ENCOUNTER — Encounter: Payer: Self-pay | Admitting: Neurology

## 2015-09-03 ENCOUNTER — Ambulatory Visit (INDEPENDENT_AMBULATORY_CARE_PROVIDER_SITE_OTHER): Payer: Federal, State, Local not specified - PPO | Admitting: Neurology

## 2015-09-03 VITALS — BP 156/90 | HR 80 | Resp 16 | Ht 65.5 in | Wt 230.0 lb

## 2015-09-03 DIAGNOSIS — G35 Multiple sclerosis: Secondary | ICD-10-CM | POA: Diagnosis not present

## 2015-09-03 DIAGNOSIS — R5383 Other fatigue: Secondary | ICD-10-CM | POA: Diagnosis not present

## 2015-09-03 DIAGNOSIS — H469 Unspecified optic neuritis: Secondary | ICD-10-CM

## 2015-09-03 DIAGNOSIS — R269 Unspecified abnormalities of gait and mobility: Secondary | ICD-10-CM | POA: Insufficient documentation

## 2015-09-03 DIAGNOSIS — F988 Other specified behavioral and emotional disorders with onset usually occurring in childhood and adolescence: Secondary | ICD-10-CM

## 2015-09-03 DIAGNOSIS — F909 Attention-deficit hyperactivity disorder, unspecified type: Secondary | ICD-10-CM

## 2015-09-03 MED ORDER — LISDEXAMFETAMINE DIMESYLATE 60 MG PO CAPS: 60.0000 mg | ORAL_CAPSULE | ORAL | 0 refills | Status: DC

## 2015-09-03 MED ORDER — BACLOFEN 10 MG PO TABS: ORAL_TABLET | ORAL | 11 refills | Status: DC

## 2015-09-03 MED ORDER — DEXTROAMPHETAMINE SULFATE ER 10 MG PO CP24: 20.0000 mg | ORAL_CAPSULE | Freq: Every day | ORAL | 0 refills | Status: DC

## 2015-09-03 NOTE — Progress Notes (Signed)
[facility]  PATIENT: Cathy [name] DOB: 1965/10/08  REFERRING DOCTOR OR PCP:  Synetta Fail  SOURCE: patient and medical records available  _________________________________   HISTORICAL  CHIEF COMPLAINT:  Chief Complaint  Patient presents with  . Follow-up    Rm 13. Patient reports that she is having spasms in L leg from knee to toes. She would also like to discuss her medications. Patient is interested in ST due to swallowing issues. She asks if she can get a refill of her Dexedrine.     HISTORY OF PRESENT ILLNESS:  Myrikal [name] is a 50 yo woman with MS.   She is noting her left leg is having more pain and spasms the past couple months.    The spasms occur more at night.   Moving helps.    Okra pickle juice was recommended by a friend and she thinks it helps slightly.   She does not feel the leg is any weaker but people have told her she is limping more.   Her right leg actually feels weaker to her (older issue).   She has fallen a couple times standing up from a chair and thinks her left leg didn't move fast enough.     MS:   She has been on Gilenya since 2013 and is tolerating it well.   She denies any exacerbation.   MRI brain 01/22/2015 showed no acute findings.      Bladder:  She is doing well with minimal frequency.  She has nocturia once a night  Vision:   She had right optic neuritis in the past and still notes right color desaturation and slightly reduced visual acuity.   This is mostly stable, however she occasionally notes transient dark spots when looking around the room.  No eye pain or diplopia.     Mood/cognition:    She denies any depression or anxiety. She notes cognitive issues, especially attention deficit, mild problems with verbal fluency and processing speed. She notes that cognitive problems have improved on the Dexedrine.has   Fatigue/sleep: She notes fatigue,  worse as the day goes on and with heat. She has a lot of benefit with  Dexedrine 20 mg twice a day.    She notes doing worse in the evenings. She sleeps fairly well at night now but had insomnia last year.   MS History:   She was diagnosed with MS after presenting with optic neuritis x 2 in 2009.   She had an MRI and LP and was diagnosed with MS.     She started on Copaxone and switched to Gilenya in 2013.    She denies any further exacerbation since the optic neuritis but was told she had MS plaques on her spine when she presented last year with a cervical radiculopathy requiring surgery.    I have seen her the past year at [facility].   Her last MRI's were Jan 21, 2014.  MRI images show foci in the cerebellum and the hemispheres consistent with MS. There were no acute findings. She has NIDDM, HTN and high cholesterol.    REVIEW OF SYSTEMS: Constitutional: No fevers, chills, sweats, or change in appetite.   Has fatigue Eyes: H/o right ON with residual color desaturation Ear, nose and throat: No hearing loss, ear pain, nasal congestion, sore throat Cardiovascular: No chest pain, palpitations Respiratory: No shortness of breath at rest or with exertion.   No wheezes GastrointestinaI: No nausea, vomiting, diarrhea, abdominal pain, fecal incontinence Genitourinary:  No dysuria, urinary retention or frequency.  No nocturia. Musculoskeletal: No neck pain, back pain Integumentary: No rash, pruritus, skin lesions Neurological: as above Psychiatric: No depression at this time.  No anxiety Endocrine: has NIDDM.  No palpitations, diaphoresis, change in appetite, change in weigh or increased thirst Hematologic/Lymphatic: No anemia, purpura, petechiae. Allergic/Immunologic: No itchy/runny eyes, nasal congestion, recent allergic reactions, rashes  ALLERGIES: Allergies  Allergen Reactions  . Aspirin     Heartburn  . Ibuprofen     heatburn    HOME MEDICATIONS:  Current Outpatient Prescriptions:  .  albuterol (PROVENTIL) (2.5 MG/3ML) 0.083% nebulizer  solution, , Disp: , Rfl: 3 .  atorvastatin (LIPITOR) 80 MG tablet, , Disp: , Rfl: 11 .  Cholecalciferol (VITAMIN D) 2000 UNITS CAPS, Take 2,000 Units by mouth daily., Disp: , Rfl:  .  dextroamphetamine (DEXEDRINE) 10 MG 24 hr capsule, Take 2-4 capsules (20-40 mg total) by mouth daily., Disp: 120 capsule, Rfl: 0 .  Fingolimod HCl (GILENYA) 0.5 MG CAPS, TAKE ONE CAPSULE (0.5 MG) BY MOUTH EACH DAY. MAY TAKE WITH OR WITHOUTFOOD. STORE AT ROOM TEMPERATURE., Disp: 90 capsule, Rfl: 3 .  hydrochlorothiazide (HYDRODIURIL) 12.5 MG tablet, , Disp: , Rfl: 2 .  loratadine (CLARITIN) 10 MG tablet, Take 10 mg by mouth daily., Disp: , Rfl:  .  pantoprazole (PROTONIX) 20 MG tablet, , Disp: , Rfl: 1 .  triamcinolone cream (KENALOG) 0.1 %, Apply topically 3 (three) times daily., Disp: 45 g, Rfl: 5 .  losartan-hydrochlorothiazide (HYZAAR) 50-12.5 MG per tablet, Take by mouth., Disp: , Rfl:  No current facility-administered medications for this visit.   Facility-Administered Medications Ordered in Other Visits:  .  gadopentetate dimeglumine (MAGNEVIST) injection 20 mL, 20 mL, Intravenous, Once PRN, Asa Lente, MD  PAST MEDICAL HISTORY: Past Medical History:  Diagnosis Date  . Diabetes mellitus without complication (HCC)   . Eczema   . Hypertension   . Multiple sclerosis (HCC)   . Vision abnormalities     PAST SURGICAL HISTORY: Past Surgical History:  Procedure Laterality Date  . ABDOMINAL HYSTERECTOMY    . CARPAL TUNNEL RELEASE    . CERVICAL SPINE SURGERY     Dr. Sharolyn Douglas  . LASER ABLATION    . TUBAL LIGATION      FAMILY HISTORY: Family History  Problem Relation Age of Onset  . Hypertension Mother   . Diabetes type II Mother   . COPD Father   . Multiple sclerosis Paternal Uncle   . Multiple sclerosis Paternal Grandfather     SOCIAL HISTORY:  Social History   Social History  . Marital status: Married    Spouse name: N/A  . Number of children: N/A  . Years of education: N/A    Occupational History  . Not on file.   Social History Main Topics  . Smoking status: Never Smoker  . Smokeless tobacco: Not on file  . Alcohol use Yes     Comment: occasionally  . Drug use: No  . Sexual activity: Yes    Birth control/ protection: Surgical   Other Topics Concern  . Not on file   Social History Narrative  . No narrative on file     PHYSICAL EXAM  Vitals:   09/03/15 1634  BP: (!) 156/90  Pulse: 80  Resp: 16  Weight: 230 lb (104.3 kg)  Height: 5' 5.5" (1.664 m)    Body mass index is 37.69 kg/m.   General: The patient is well-developed and well-nourished and in  no acute distress  Eyes:  Funduscopic exam shows normal optic discs and retinal vessels.  Neck: The neck is supple, no carotid bruits are noted.  The neck is nontender.  Cardiovascular: The heart has a regular rate and rhythm with a normal S1 and S2. There were no murmurs, gallops or rubs. Lungs are clear to auscultation.   Musculoskeletal:  Back is nontender  Neurologic Exam  Mental status: The patient is alert and oriented x 3 at the time of the examination. The patient has apparent normal recent and remote memory, with an apparently normal attention span and concentration ability.   Speech is normal.  Cranial nerves: Extraocular movements are full. She reports reduced color saturation out of the right eye. Facial symmetry is present. There is good facial sensation to soft touch bilaterally.Facial strength is normal.  Trapezius and sternocleidomastoid strength is normal. No dysarthria is noted.  The tongue is midline, and the patient has symmetric elevation of the soft palate. No obvious hearing deficits are noted.  Motor:  Muscle bulk is normal.   Tone is mildly increased in legs, left > right. Strength is  5 / 5 in all 4 extremities.   Sensory: Sensory testing is intact to  soft touch and vibration sensation in all 4 extremities.  Coordination: Cerebellar testing reveals good  finger-nose-finger and heel-to-shin bilaterally.  Gait and station: Station is normal.   Gait is normal. Tandem gait is wide . Romberg is negative.   Reflexes: Deep tendon reflexes are symmetric and normal bilaterally.        DIAGNOSTIC DATA (LABS, IMAGING, TESTING) - I reviewed patient records, labs, notes, testing and imaging myself where available.     ASSESSMENT AND PLAN  Multiple sclerosis (HCC)  Optic neuritis, right  Attention deficit disorder  Other fatigue  Gait disturbance    1.  Continue Gilenya.   Was in the year, we will check an MRI of the brain and cervical spine to assess for the possibility of subclinical progression. If this is occurring, we would need to consider a different disease modifying therapy. 2.  Renew Dexedrine for attention deficit.  If we can Vyvanse approved, we will give the Vyvanse 60 mg a try to see if that helps her more later in the day. 3.  Advised to continue to use melatonin for sleep. rtc 4- 6 months but call sooner if any new or worsening neurologic issues  Richard A. Epimenio Foot, MD, PhD 09/03/2015, 4:43 PM Certified in Neurology, Clinical Neurophysiology, Sleep Medicine, Pain Medicine and Neuroimaging  [facility] [address], [address] [place], [place] [address] [phone]

## 2015-09-25 ENCOUNTER — Telehealth: Payer: Self-pay | Admitting: Neurology

## 2015-09-25 NOTE — Telephone Encounter (Signed)
LMTC.  Need clarification on which rx. she can't locate.  Next rx. due 10-03-15/fim

## 2015-09-25 NOTE — Telephone Encounter (Signed)
Pt called to advise she can not find the written RX for dextroamphetamine (DEXEDRINE) 10 MG 24 hr capsule and  lisdexamfetamine (VYVANSE) 60 MG capsule .  She is also requesting to pick RX up today. She was advised the clinic closes at 12 and she will need to call back at 11:30 to see if RX is at the front. I could not ensure it would be done today

## 2015-09-29 NOTE — Telephone Encounter (Signed)
LMTC.  Last Dexedrine rx. was given 09-03-15.  Next rx. is due 10-03-15.  RAS is out of the office today.  I will have to check with him tomorrow when he returns to the office to see if this can be filled a few days early/fim

## 2015-09-29 NOTE — Telephone Encounter (Signed)
Pt called back stating dextroamphetamine (DEXEDRINE) 10 MG 24 hr capsule is the rx she can not find. It was a written rx.

## 2015-09-30 MED ORDER — DEXTROAMPHETAMINE SULFATE ER 10 MG PO CP24: 20.0000 mg | ORAL_CAPSULE | Freq: Every day | ORAL | 0 refills | Status: DC

## 2015-09-30 NOTE — Telephone Encounter (Signed)
LMOM that per RAS, ok to go ahead and fill Dexedrine.  She may pick rx. up in our office this afternoon.  Rx. awaiting RAS sig/fim

## 2015-09-30 NOTE — Addendum Note (Signed)
Addended by: Candis Schatz I on: 09/30/2015 02:42 PM   Modules accepted: Orders

## 2015-09-30 NOTE — Telephone Encounter (Signed)
Dexedrine rx. up front [facility]/fim

## 2015-10-05 ENCOUNTER — Telehealth: Payer: Self-pay | Admitting: Neurology

## 2015-10-05 NOTE — Telephone Encounter (Signed)
I have spoken with Cathy [name] this morning.  She c/o left arm/shoulder pain with movement onset 10-02-15.  No known injury.  She can move arm, but movement causes pain.  She has an appt. with RAS tomorrow.  I offered earlier appt. tomorrow but she declined/fim

## 2015-10-05 NOTE — Telephone Encounter (Signed)
Pt called in this morning stating she has no use of left arm. It stared on Friday. She has made an appt for 9/12 at 4:20p. Please call and advise [phone]

## 2015-10-06 ENCOUNTER — Ambulatory Visit (INDEPENDENT_AMBULATORY_CARE_PROVIDER_SITE_OTHER): Payer: Federal, State, Local not specified - PPO | Admitting: Neurology

## 2015-10-06 ENCOUNTER — Encounter: Payer: Self-pay | Admitting: Neurology

## 2015-10-06 ENCOUNTER — Encounter (INDEPENDENT_AMBULATORY_CARE_PROVIDER_SITE_OTHER): Payer: Self-pay

## 2015-10-06 VITALS — BP 142/74 | HR 16 | Resp 18 | Ht 65.5 in | Wt 231.5 lb

## 2015-10-06 DIAGNOSIS — M25512 Pain in left shoulder: Secondary | ICD-10-CM | POA: Diagnosis not present

## 2015-10-06 DIAGNOSIS — G35 Multiple sclerosis: Secondary | ICD-10-CM | POA: Diagnosis not present

## 2015-10-06 DIAGNOSIS — R269 Unspecified abnormalities of gait and mobility: Secondary | ICD-10-CM | POA: Diagnosis not present

## 2015-10-06 DIAGNOSIS — F909 Attention-deficit hyperactivity disorder, unspecified type: Secondary | ICD-10-CM | POA: Diagnosis not present

## 2015-10-06 DIAGNOSIS — F988 Other specified behavioral and emotional disorders with onset usually occurring in childhood and adolescence: Secondary | ICD-10-CM

## 2015-10-06 MED ORDER — DEXTROAMPHETAMINE SULFATE ER 10 MG PO CP24: 20.0000 mg | ORAL_CAPSULE | Freq: Every day | ORAL | 0 refills | Status: DC

## 2015-10-06 NOTE — Progress Notes (Signed)
[facility]  PATIENT: Cathy [name] DOB: 09/04/1965  REFERRING DOCTOR OR PCP:  Synetta Failaniel Jobe  SOURCE: patient and medical records available  _________________________________   HISTORICAL  CHIEF COMPLAINT:  Chief Complaint  Patient presents with  . Multiple Sclerosis    Sts.  she is having more pain in her left shoulder onset 4 days ago.  Denies known injury./fim    HISTORY OF PRESENT ILLNESS:  Cathy FesterChrista Roderick is a 50 yo woman with MS with new onset left shoulder pain and weakness. .    Shoulder:    Her shoulder is still very painful but she can lift it up more   On Friday, she had the onset of left shoulder pain and was unable to lift the arm.       MS:   She has been on Gilenya since 2013 and is tolerating it well.  She denies any new numbness or weakness in the right arm or either leg.   MRI brain 01/22/2015 showed no acute findings.      Bladder:  She is doing well with minimal frequency.  She has nocturia once a night  Vision:   She had right optic neuritis in the past and still notes right color desaturation and slightly reduced visual acuity.   This is mostly stable, however she occasionally notes transient dark spots when looking around the room.  No eye pain or diplopia.     Mood/cognition:    She denies any depression or anxiety. She notes cognitive issues, especially attention deficit, mild problems with verbal fluency and processing speed. She notes that cognitive problems have improved on the Dexedrine.has   Fatigue/sleep: She notes fatigue,  worse as the day goes on and with heat. She has a lot of benefit with Dexedrine 20 mg twice a day.   Sometimes, however, she gets much more tired in the evenings. She falls aslee well at night now but had insomnia last year.   MS History:   She was diagnosed with MS after presenting with optic neuritis x 2 in 2009.   She had an MRI and LP and was diagnosed with MS.     She started on Copaxone and switched  to Gilenya in 2013.    She denies any further exacerbation since the optic neuritis but was told she had MS plaques on her spine when she presented last year with a cervical radiculopathy requiring surgery.    [facility] have [facility].   Her last MRI's were Jan 21, 2014.  MRI images show foci in the cerebellum and the hemispheres consistent with MS. There were no acute findings. She has NIDDM, HTN and high cholesterol.    REVIEW OF SYSTEMS: Constitutional: No fevers, chills, sweats, or change in appetite.   Has fatigue Eyes: H/o right ON with residual color desaturation Ear, nose and throat: No hearing loss, ear pain, nasal congestion, sore throat Cardiovascular: No chest pain, palpitations Respiratory: No shortness of breath at rest or with exertion.   No wheezes GastrointestinaI: No nausea, vomiting, diarrhea, abdominal pain, fecal incontinence Genitourinary: No dysuria, urinary retention or frequency.  No nocturia. Musculoskeletal: No neck pain, back pain,. Notes left shoulder pain. Integumentary: No rash, pruritus, skin lesions Neurological: as above Psychiatric: No depression at this time.  No anxiety Endocrine: has NIDDM.  No palpitations, diaphoresis, change in appetite, change in weigh or increased thirst Hematologic/Lymphatic: No anemia, purpura, petechiae. Allergic/Immunologic: No itchy/runny eyes, nasal congestion, recent allergic  reactions, rashes  ALLERGIES: Allergies  Allergen Reactions  . Aspirin     Heartburn  . Ibuprofen     heatburn    HOME MEDICATIONS:  Current Outpatient Prescriptions:  .  albuterol (PROVENTIL) (2.5 MG/3ML) 0.083% nebulizer solution, , Disp: , Rfl: 3 .  atorvastatin (LIPITOR) 80 MG tablet, , Disp: , Rfl: 11 .  baclofen (LIORESAL) 10 MG tablet, Take one or two at bedtime, Disp: 60 each, Rfl: 11 .  Cholecalciferol (VITAMIN D) 2000 UNITS CAPS, Take 2,000 Units by mouth daily., Disp: , Rfl:  .  dextroamphetamine  (DEXEDRINE) 10 MG 24 hr capsule, Take 2-4 capsules (20-40 mg total) by mouth daily. Fill on or after 10/30/2015, Disp: 120 capsule, Rfl: 0 .  Fingolimod HCl (GILENYA) 0.5 MG CAPS, TAKE ONE CAPSULE (0.5 MG) BY MOUTH EACH DAY. MAY TAKE WITH OR WITHOUTFOOD. STORE AT ROOM TEMPERATURE., Disp: 90 capsule, Rfl: 3 .  hydrochlorothiazide (HYDRODIURIL) 12.5 MG tablet, , Disp: , Rfl: 2 .  lisdexamfetamine (VYVANSE) 60 MG capsule, Take 1 capsule (60 mg total) by mouth every morning., Disp: 30 capsule, Rfl: 0 .  loratadine (CLARITIN) 10 MG tablet, Take 10 mg by mouth daily., Disp: , Rfl:  .  pantoprazole (PROTONIX) 20 MG tablet, , Disp: , Rfl: 1 .  triamcinolone cream (KENALOG) 0.1 %, Apply topically 3 (three) times daily., Disp: 45 g, Rfl: 5 .  losartan-hydrochlorothiazide (HYZAAR) 50-12.5 MG per tablet, Take by mouth., Disp: , Rfl:  No current facility-administered medications for this visit.   Facility-Administered Medications Ordered in Other Visits:  .  gadopentetate dimeglumine (MAGNEVIST) injection 20 mL, 20 mL, Intravenous, Once PRN, Asa Lente, MD  PAST MEDICAL HISTORY: Past Medical History:  Diagnosis Date  . Diabetes mellitus without complication (HCC)   . Eczema   . Hypertension   . Multiple sclerosis (HCC)   . Vision abnormalities     PAST SURGICAL HISTORY: Past Surgical History:  Procedure Laterality Date  . ABDOMINAL HYSTERECTOMY    . CARPAL TUNNEL RELEASE    . CERVICAL SPINE SURGERY     Dr. Sharolyn Douglas  . LASER ABLATION    . TUBAL LIGATION      FAMILY HISTORY: Family History  Problem Relation Age of Onset  . Hypertension Mother   . Diabetes type II Mother   . COPD Father   . Multiple sclerosis Paternal Uncle   . Multiple sclerosis Paternal Grandfather     SOCIAL HISTORY:  Social History   Social History  . Marital status: Married    Spouse name: N/A  . Number of children: N/A  . Years of education: N/A   Occupational History  . Not on file.   Social  History Main Topics  . Smoking status: Never Smoker  . Smokeless tobacco: Not on file  . Alcohol use Yes     Comment: occasionally  . Drug use: No  . Sexual activity: Yes    Birth control/ protection: Surgical   Other Topics Concern  . Not on file   Social History Narrative  . No narrative on file     PHYSICAL EXAM  Vitals:   10/06/15 1629  BP: (!) 142/74  Pulse: (!) 16  Resp: 18  Weight: 231 lb 8 oz (105 kg)  Height: 5' 5.5" (1.664 m)    Body mass index is 37.94 kg/m.   General: The patient is well-developed and well-nourished and in no acute distress  Neck: The neck is supple with good ROM.  The neck is nontender.   Musculoskeletal:  There is moderately severe tenderness over the subacromial bursa and moderate tenderness over the glenohumeral joint of the left shoulder. No tenderness over the bicipital tendon or the [facility] joint.   Inspection of the shoulder is normal. Range of motion is reduced. There is tenderness upon elevating the shoulder or external rotation.  Neurologic Exam  Mental status: The patient is alert and oriented x 3 at the time of the examination. The patient has apparent normal recent and remote memory, with an apparently normal attention span and concentration ability.   Speech is normal.  Cranial nerves: Extraocular movements are full.  There is good facial sensation to soft touch bilaterally.Facial strength is normal.  Trapezius and sternocleidomastoid strength is normal. No dysarthria is noted.   No obvious hearing deficits are noted.  Motor:  Muscle bulk is normal.   Tone is mildly increased in legs, left > right. Strength is  5 / 5 in all 4 extremities.   Sensory: Sensory testing is intact to soft touch and vibration sensation in all 4 extremities.  Coordination: Cerebellar testing reveals good finger-nose-finger and heel-to-shin bilaterally.  Gait and station: Station is normal.   Gait is normal. Tandem gait is wid. Romberg is negative.    Reflexes: Deep tendon reflexes are symmetric and normal bilaterally.        DIAGNOSTIC DATA (LABS, IMAGING, TESTING) - I reviewed patient records, labs, notes, testing and imaging myself where available.     ASSESSMENT AND PLAN  Left shoulder pain  Multiple sclerosis (HCC)  Attention deficit disorder  Gait disturbance    1.  Continue Gilenya for MS.   We will check an MRI of the brain and cervical spine later in the year to assess for the possibility of subclinical progression. If this is occurring, we would need to consider a different disease modifying therapy. 2.  Left subacromial bursa injection with 60 mg Depo-Medrol in 2.5 mL Marcaine. She tolerated the injection well and there were no complications. Pain was at least 50% better a few minutes later.   3. Renew Dexedrine for attention deficit.  4. rtc 3-4 months or sooner if new issus  Harold Moncus A. Epimenio Foot, MD, PhD 10/06/2015, 5:59 PM Certified in Neurology, Clinical Neurophysiology, Sleep Medicine, Pain Medicine and Neuroimaging  [facility] [address], [address] [place], [place] [address] [phone] -

## 2015-11-27 ENCOUNTER — Ambulatory Visit: Payer: Federal, State, Local not specified - PPO | Admitting: Neurology

## 2016-01-01 ENCOUNTER — Telehealth: Payer: Self-pay | Admitting: Neurology

## 2016-01-01 MED ORDER — DEXTROAMPHETAMINE SULFATE ER 10 MG PO CP24: 20.0000 mg | ORAL_CAPSULE | Freq: Every day | ORAL | 0 refills | Status: DC

## 2016-01-01 NOTE — Addendum Note (Signed)
Addended by: Candis SchatzMISENHEIMER, Bassy Fetterly I on: 01/01/2016 09:16 AM   Modules accepted: Orders

## 2016-01-01 NOTE — Telephone Encounter (Signed)
Patient requesting refill of dextroamphetamine (DEXEDRINE) 10 MG 24 hr capsule. I advised the Rx will be ready in 24 hours unless the nurse advises otherwise which will be Monday. The patient will call before picking up.

## 2016-01-01 NOTE — Telephone Encounter (Signed)
Rx. up front [facility]/fim 

## 2016-01-01 NOTE — Telephone Encounter (Signed)
Rx. awaiting RAS sig/fim 

## 2016-01-05 ENCOUNTER — Ambulatory Visit: Payer: Federal, State, Local not specified - PPO | Admitting: Neurology

## 2016-01-06 ENCOUNTER — Ambulatory Visit (INDEPENDENT_AMBULATORY_CARE_PROVIDER_SITE_OTHER): Payer: Federal, State, Local not specified - PPO | Admitting: Neurology

## 2016-01-06 ENCOUNTER — Encounter: Payer: Self-pay | Admitting: Neurology

## 2016-01-06 VITALS — BP 136/68 | HR 70 | Resp 16 | Ht 65.5 in | Wt 233.0 lb

## 2016-01-06 DIAGNOSIS — G35 Multiple sclerosis: Secondary | ICD-10-CM

## 2016-01-06 DIAGNOSIS — H469 Unspecified optic neuritis: Secondary | ICD-10-CM

## 2016-01-06 DIAGNOSIS — R208 Other disturbances of skin sensation: Secondary | ICD-10-CM | POA: Insufficient documentation

## 2016-01-06 DIAGNOSIS — F988 Other specified behavioral and emotional disorders with onset usually occurring in childhood and adolescence: Secondary | ICD-10-CM

## 2016-01-06 DIAGNOSIS — R5383 Other fatigue: Secondary | ICD-10-CM | POA: Diagnosis not present

## 2016-01-06 DIAGNOSIS — R269 Unspecified abnormalities of gait and mobility: Secondary | ICD-10-CM | POA: Diagnosis not present

## 2016-01-06 MED ORDER — DEXTROAMPHETAMINE SULFATE ER 10 MG PO CP24: 20.0000 mg | ORAL_CAPSULE | Freq: Every day | ORAL | 0 refills | Status: DC

## 2016-01-06 NOTE — Progress Notes (Signed)
[facility]  PATIENT: Cathy [name] DOB: February 17, 1965  REFERRING DOCTOR OR PCP:  Synetta Fail  SOURCE: patient and medical records available  _________________________________   HISTORICAL  CHIEF COMPLAINT:  Chief Complaint  Patient presents with  . Multiple Sclerosis    Sts. she continues to tolerate Gilenya well.  Sts. walking is some worse--sts. family tells her she drags her right leg and she is having more numbness in her legs.  Sts. she has fallen twice.   Needs FMLA paperwork completed/fim    HISTORY OF PRESENT ILLNESS:  Cathy [name] is a 50 y.o. woman with MS noting more gait issues. .    MS:   She has been on Gilenya since 2013 and is tolerating it well.  She denies any new numbness or weakness in the right arm or either leg.   MRI brain 01/22/2015 showed no acute findings.      Gait/strength/sensation:   Her right leg is dragging a more and it is more noticeable.   She stumbles and has had a few falls.  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    She gets a [employer] sensation in both legs.  Bladder:  She is doing well with minimal frequency.  She has nocturia once a night  Vision:   She feels this is stable.  However,  she had right optic neuritis in the past and still notes right color desaturation and slightly reduced visual acuity.   No eye pain or diplopia.     Mood/cognition:    She denies any depression or anxiety. She notes cognitive issues, especially attention deficit, mild problems with verbal fluency and processing speed. She notes that cognitive problems have improved on the Dexedrine.has   Fatigue/sleep: She notes fatigue,  worse as the day goes on and with heat. She has a lot of benefit with Dexedrine 20 mg twice a day.   Sometimes, however, she gets much more tired in the evenings. She falls aslee well at night now but had insomnia last year.   Shoulder:    Her shoulder is  much better since the shoulder injection at the last visit.    MS History:   She was diagnosed with MS after presenting with optic neuritis x 2 in 2009.   She had an MRI and LP and was diagnosed with MS.     She started on Copaxone and switched to Gilenya in 2013.    She denies any further exacerbation since the optic neuritis but was told she had MS plaques on her spine when she presented last year with a cervical radiculopathy requiring surgery.    I have seen her the past year at [facility].   Her last MRI's were Jan 21, 2014.  MRI images show foci in the cerebellum and the hemispheres consistent with MS. There were no acute findings. She has NIDDM, HTN and high cholesterol.    REVIEW OF SYSTEMS: Constitutional: No fevers, chills, sweats, or change in appetite.   Has fatigue Eyes: H/o right ON with residual color desaturation Ear, nose and throat: No hearing loss, ear pain, nasal congestion, sore throat Cardiovascular: No chest pain, palpitations Respiratory: No shortness of breath at rest or with exertion.   No wheezes GastrointestinaI: No nausea, vomiting, diarrhea, abdominal pain, fecal incontinence Genitourinary: No dysuria, urinary retention or frequency.  No nocturia. Musculoskeletal: No neck pain, back pain,. Notes left shoulder pain. Integumentary: No  rash, pruritus, skin lesions Neurological: as above Psychiatric: No depression at this time.  No anxiety Endocrine: has NIDDM.  No palpitations, diaphoresis, change in appetite, change in weigh or increased thirst Hematologic/Lymphatic: No anemia, purpura, petechiae. Allergic/Immunologic: No itchy/runny eyes, nasal congestion, recent allergic reactions, rashes  ALLERGIES: Allergies  Allergen Reactions  . Aspirin     Heartburn  . Ibuprofen     heatburn    HOME MEDICATIONS:  Current Outpatient Prescriptions:  .  albuterol (PROVENTIL) (2.5 MG/3ML) 0.083% nebulizer solution, , Disp: , Rfl: 3 .  atorvastatin  (LIPITOR) 80 MG tablet, , Disp: , Rfl: 11 .  Cholecalciferol (VITAMIN D) 2000 UNITS CAPS, Take 2,000 Units by mouth daily., Disp: , Rfl:  .  dextroamphetamine (DEXEDRINE) 10 MG 24 hr capsule, Take 2-4 capsules (20-40 mg total) by mouth daily. Fill on or after 01/06/2016, Disp: 120 capsule, Rfl: 0 .  Fingolimod HCl (GILENYA) 0.5 MG CAPS, TAKE ONE CAPSULE (0.5 MG) BY MOUTH EACH DAY. MAY TAKE WITH OR WITHOUTFOOD. STORE AT ROOM TEMPERATURE., Disp: 90 capsule, Rfl: 3 .  hydrochlorothiazide (HYDRODIURIL) 12.5 MG tablet, , Disp: , Rfl: 2 .  loratadine (CLARITIN) 10 MG tablet, Take 10 mg by mouth daily., Disp: , Rfl:  .  pantoprazole (PROTONIX) 20 MG tablet, , Disp: , Rfl: 1 .  triamcinolone cream (KENALOG) 0.1 %, Apply topically 3 (three) times daily., Disp: 45 g, Rfl: 5 .  baclofen (LIORESAL) 10 MG tablet, Take one or two at bedtime (Patient not taking: Reported on 01/06/2016), Disp: 60 each, Rfl: 11 .  losartan-hydrochlorothiazide (HYZAAR) 50-12.5 MG per tablet, Take by mouth., Disp: , Rfl:  No current facility-administered medications for this visit.   Facility-Administered Medications Ordered in Other Visits:  .  gadopentetate dimeglumine (MAGNEVIST) injection 20 mL, 20 mL, Intravenous, Once PRN, Asa Lenteichard A Sater, MD  PAST MEDICAL HISTORY: Past Medical History:  Diagnosis Date  . Diabetes mellitus without complication (HCC)   . Eczema   . Hypertension   . Multiple sclerosis (HCC)   . Vision abnormalities     PAST SURGICAL HISTORY: Past Surgical History:  Procedure Laterality Date  . ABDOMINAL HYSTERECTOMY    . CARPAL TUNNEL RELEASE    . CERVICAL SPINE SURGERY     Dr. Sharolyn DouglasMax Cohen  . LASER ABLATION    . TUBAL LIGATION      FAMILY HISTORY: Family History  Problem Relation Age of Onset  . Hypertension Mother   . Diabetes type II Mother   . COPD Father   . Multiple sclerosis Paternal Uncle   . Multiple sclerosis Paternal Grandfather     SOCIAL HISTORY:  Social History   Social  History  . Marital status: Married    Spouse name: N/A  . Number of children: N/A  . Years of education: N/A   Occupational History  . Not on file.   Social History Main Topics  . Smoking status: Never Smoker  . Smokeless tobacco: Not on file  . Alcohol use Yes     Comment: occasionally  . Drug use: No  . Sexual activity: Yes    Birth control/ protection: Surgical   Other Topics Concern  . Not on file   Social History Narrative  . No narrative on file     PHYSICAL EXAM  Vitals:   01/06/16 1026  BP: 136/68  Pulse: 70  Resp: 16  Weight: 233 lb (105.7 kg)  Height: 5' 5.5" (1.664 m)    Body mass index is 38.18 kg/m.  General: The patient is well-developed and well-nourished and in no acute distress  Neck: The neck is supple with good ROM.   The neck is nontender.   Musculoskeletal:  Shoulder is non-tender now with good ROM  Neurologic Exam  Mental status: The patient is alert and oriented x 3 at the time of the examination. The patient has apparent normal recent and remote memory, with an apparently normal attention span and concentration ability.   Speech is normal.  Cranial nerves: Extraocular movements are full.  There is good facial sensation to soft touch bilaterally.Facial strength is normal.  Trapezius and sternocleidomastoid strength is normal. No dysarthria is noted.   No obvious hearing deficits are noted.  Motor:  Muscle bulk is normal.   Tone is mildly increased in legs, right > left. Strength is  5 / 5 in all 4 extremities.   Sensory: Sensory testing is intact to soft touch and vibration sensation in all 4 extremities.  Coordination: Cerebellar testing reveals good finger-nose-finger and heel-to-shin bilaterally.  Gait and station: Station is normal.   Gait is normal. Tandem gait is wide. Romberg is negative.   Reflexes: Deep tendon reflexes are symmetric and normal bilaterally.        DIAGNOSTIC DATA (LABS, IMAGING, TESTING) - I reviewed  patient records, labs, notes, testing and imaging myself where available.     ASSESSMENT AND PLAN  Multiple sclerosis (HCC) - Plan: CBC with Differential/Platelet, MR BRAIN W WO CONTRAST, MR CERVICAL SPINE W WO CONTRAST, Hepatic function panel  Gait disturbance - Plan: MR BRAIN W WO CONTRAST, MR CERVICAL SPINE W WO CONTRAST  Attention deficit disorder, unspecified hyperactivity presence  Other fatigue  Optic neuritis, right  Dysesthesia   1.  Continue Gilenya for MS.  Gait is mildly worse than earlier and she could be having subclinical progression.   We will check an MRI of the brain and cervical spine  to assess for the possibility of subclinical progression. If this is occurring, we would need to consider a different disease modifying therapy. 2.  Renew Dexedrine for attention deficit.  3.   Try to remain active.  If gait worsens, consider AFO or trial of Ampyra 4. rtc 3-4 months or sooner if new issus  Richard A. Epimenio Foot, MD, PhD 01/06/2016, 10:51 AM Certified in Neurology, Clinical Neurophysiology, Sleep Medicine, Pain Medicine and Neuroimaging  [facility] [address], [address] [place], [place] [address] [phone] -

## 2016-01-07 LAB — CBC WITH DIFFERENTIAL/PLATELET
BASOS: 0 %
Basophils Absolute: 0 10*3/uL (ref 0.0–0.2)
EOS (ABSOLUTE): 0.1 10*3/uL (ref 0.0–0.4)
EOS: 2 %
HEMATOCRIT: 36.6 % (ref 34.0–46.6)
Hemoglobin: 12.7 g/dL (ref 11.1–15.9)
Immature Grans (Abs): 0 10*3/uL (ref 0.0–0.1)
Immature Granulocytes: 0 %
LYMPHS ABS: 0.6 10*3/uL — AB (ref 0.7–3.1)
Lymphs: 13 %
MCH: 29.3 pg (ref 26.6–33.0)
MCHC: 34.7 g/dL (ref 31.5–35.7)
MCV: 84 fL (ref 79–97)
MONOS ABS: 0.5 10*3/uL (ref 0.1–0.9)
Monocytes: 10 %
Neutrophils Absolute: 3.4 10*3/uL (ref 1.4–7.0)
Neutrophils: 75 %
PLATELETS: 365 10*3/uL (ref 150–379)
RBC: 4.34 x10E6/uL (ref 3.77–5.28)
RDW: 14.7 % (ref 12.3–15.4)
WBC: 4.6 10*3/uL (ref 3.4–10.8)

## 2016-01-07 LAB — HEPATIC FUNCTION PANEL
ALT: 22 IU/L (ref 0–32)
AST: 15 IU/L (ref 0–40)
Albumin: 4.4 g/dL (ref 3.5–5.5)
Alkaline Phosphatase: 125 IU/L — ABNORMAL HIGH (ref 39–117)
Bilirubin Total: 0.3 mg/dL (ref 0.0–1.2)
Bilirubin, Direct: 0.1 mg/dL (ref 0.00–0.40)
Total Protein: 6.6 g/dL (ref 6.0–8.5)

## 2016-01-08 ENCOUNTER — Telehealth: Payer: Self-pay | Admitting: *Deleted

## 2016-01-08 NOTE — Telephone Encounter (Signed)
-----   Message from Richard A Sater, MD sent at 01/07/2016  8:21 AM EST ----- Please let her know the lab work looks fine. (Low lymphocytes are expected with Gilenya) 

## 2016-01-08 NOTE — Telephone Encounter (Signed)
LMOM (identified vm) that per RAS, labs done in our office look ok.  Lymphocytes are some low, but this is because of the Gilenya.  She does not need to return this call unless she has questions/fim

## 2016-01-08 NOTE — Telephone Encounter (Signed)
-----   Message from Asa Lenteichard A Sater, MD sent at 01/07/2016  8:21 AM EST ----- Please let her know the lab work looks fine. (Low lymphocytes are expected with Gilenya)

## 2016-01-13 ENCOUNTER — Telehealth: Payer: Self-pay | Admitting: *Deleted

## 2016-01-13 NOTE — Telephone Encounter (Signed)
I called pt, left message to call medical records. Pt form is ready .

## 2016-01-29 ENCOUNTER — Ambulatory Visit
Admission: RE | Admit: 2016-01-29 | Discharge: 2016-01-29 | Disposition: A | Discharge status: Home / Self Care | Payer: Federal, State, Local not specified - PPO | Source: Ambulatory Visit | Attending: Neurology | Admitting: Neurology

## 2016-01-29 DIAGNOSIS — G35 Multiple sclerosis: Secondary | ICD-10-CM

## 2016-01-29 DIAGNOSIS — R269 Unspecified abnormalities of gait and mobility: Secondary | ICD-10-CM

## 2016-01-29 MED ORDER — GADOBENATE DIMEGLUMINE 529 MG/ML IV SOLN
20.0000 mL | Freq: Once | INTRAVENOUS | Status: AC | PRN
  Administered 2016-01-29: 20 mL via INTRAVENOUS

## 2016-02-02 ENCOUNTER — Telehealth: Payer: Self-pay | Admitting: *Deleted

## 2016-02-02 ENCOUNTER — Telehealth: Payer: Self-pay | Admitting: Neurology

## 2016-02-02 NOTE — Telephone Encounter (Signed)
-----   Message from Asa Lente, MD sent at 02/01/2016 12:34 PM EST ----- Please let her know that the MRI of the brain was unchanged compared to the MRI from 2016. The MRI of the cervical spine shows a couple of MS plaques. Her last scan was done at [facility] (08/02/2013). Please see if we can get those images for comparison.

## 2016-02-02 NOTE — Telephone Encounter (Signed)
Rose/[facility] [phone] returned RN's call

## 2016-02-02 NOTE — Telephone Encounter (Signed)
I have spoken with Cathy [name] this afternoon, and reviewed below MRI results with her; explained that I have requested last MRI c/s done at [facility] in July 2015 for comparison.  She verbalized understanding of same/fim

## 2016-02-02 NOTE — Telephone Encounter (Signed)
I have spoken with Rose this morning and have faxed request for cd of last mri brain/spine to her at fax# [phone]/fim

## 2016-02-18 ENCOUNTER — Telehealth: Payer: Self-pay | Admitting: Neurology

## 2016-02-18 NOTE — Telephone Encounter (Signed)
Patient called requesting refill for dextroamphetamine (DEXEDRINE) 10 MG 24 hr capsule.

## 2016-02-19 MED ORDER — DEXTROAMPHETAMINE SULFATE ER 10 MG PO CP24: 20.0000 mg | ORAL_CAPSULE | Freq: Every day | ORAL | 0 refills | Status: DC

## 2016-02-19 NOTE — Addendum Note (Signed)
Addended by: Candis Schatz I on: 02/19/2016 08:33 AM   Modules accepted: Orders

## 2016-02-19 NOTE — Telephone Encounter (Signed)
Rx. awaiting RAS sig/fim 

## 2016-02-22 ENCOUNTER — Telehealth: Payer: Self-pay | Admitting: Neurology

## 2016-02-22 MED ORDER — FINGOLIMOD HCL 0.5 MG PO CAPS: ORAL_CAPSULE | ORAL | 3 refills | Status: DC

## 2016-02-22 NOTE — Telephone Encounter (Signed)
Patient requesting refill of dextroamphetamine (DEXEDRINE) 10 MG 24 hr capsule

## 2016-02-22 NOTE — Telephone Encounter (Signed)
Dexedrine rx. up front [facility]/fim 

## 2016-02-22 NOTE — Telephone Encounter (Signed)
Rx. is up front [facility]/fim

## 2016-02-22 NOTE — Addendum Note (Signed)
Addended by: Candis Schatz I on: 02/22/2016 10:23 AM   Modules accepted: Orders

## 2016-02-22 NOTE — Telephone Encounter (Signed)
Gilenya rx. faxed to [facility] as requested/fim

## 2016-02-22 NOTE — Telephone Encounter (Signed)
Cathy [name] with [facility] has called requesting refill for Fingolimod HCl (GILENYA) 0.5 MG CAPS for patient.  Patient has had a change in pharmacy [facility].  Fax #- [phone].

## 2016-02-26 MED ORDER — FINGOLIMOD HCL 0.5 MG PO CAPS: ORAL_CAPSULE | ORAL | 3 refills | Status: DC

## 2016-02-26 NOTE — Telephone Encounter (Signed)
Patient called in reference to Fingolimod HCl (GILENYA) 0.5 MG CAPS.  Patient has been off the medication since Tuesday due to confusion where her medication is.  Please leave detailed message on VM per patient.

## 2016-02-26 NOTE — Addendum Note (Signed)
Addended by: Candis Schatz I on: 02/26/2016 08:49 AM   Modules accepted: Orders

## 2016-02-26 NOTE — Telephone Encounter (Signed)
LMOM that I am resending rx. to [facility] now.  If she can come into the office this morning, I can give her a week of Gilenya so she can restart it now./fim

## 2016-02-29 ENCOUNTER — Telehealth: Payer: Self-pay | Admitting: *Deleted

## 2016-02-29 MED ORDER — FINGOLIMOD HCL 0.5 MG PO CAPS: ORAL_CAPSULE | ORAL | 3 refills | Status: DC

## 2016-02-29 NOTE — Telephone Encounter (Signed)
I have spoken with [name] and explained that, after I escribed Gil[facility]ya rx. to [facility], I received a [facility] today stating that, as of 01-25-16, rx should go to [facility].  I have escribed Gilenya to [facility], given pt. their phone #.  1 wk. of Gilenya samples up front for pt's husband to pick up in the morning/fim

## 2016-02-29 NOTE — Telephone Encounter (Signed)
Gilenya rx. escribed to [facility] per faxed request from [facility]/fim

## 2016-02-29 NOTE — Telephone Encounter (Signed)
Patient returning nurse's call in reference to Gilenya.  Please call [phone] ext: [phone]. (can leave VM per patient).

## 2016-03-24 ENCOUNTER — Telehealth: Payer: Self-pay | Admitting: Neurology

## 2016-03-24 MED ORDER — DEXTROAMPHETAMINE SULFATE ER 10 MG PO CP24: 20.0000 mg | ORAL_CAPSULE | Freq: Every day | ORAL | 0 refills | Status: DC

## 2016-03-24 NOTE — Telephone Encounter (Signed)
Rx. up front [facility]/fim 

## 2016-03-24 NOTE — Telephone Encounter (Signed)
Pt request refill for dextroamphetamine (DEXEDRINE) 10 MG 24 hr capsule  Pt is also going to fax (f) [phone] an example of a letter she needs for her job showing she has a disability

## 2016-03-24 NOTE — Telephone Encounter (Signed)
Rx. awaiting RAS sig/fim 

## 2016-04-05 ENCOUNTER — Telehealth: Payer: Self-pay | Admitting: *Deleted

## 2016-04-05 ENCOUNTER — Encounter: Payer: Self-pay | Admitting: *Deleted

## 2016-04-05 NOTE — Telephone Encounter (Signed)
LMTC.  I have letters that she requested ready.  I just need to know if she wants to pick them up, or wants me to mail or fax them.  If she wants me to fax them, I just need a fax#.  Thanks--Airi Copado

## 2016-04-26 NOTE — Telephone Encounter (Signed)
Patient called office in reference to letter that has been completed on 04/05/16.  Patient states she does not work from home and would like the information to be updated.  Please call

## 2016-05-02 NOTE — Telephone Encounter (Signed)
Patient returning your call. She said the first letter did say she works from home. Patient can be reached at [phone] ext. [phone].

## 2016-05-02 NOTE — Telephone Encounter (Signed)
Patient is calling requesting a call back regarding letter from 04-05-16.  The letter needs to state the patient does not work from home. Please call and discuss.

## 2016-05-02 NOTE — Telephone Encounter (Signed)
LMOM for pt. to call.  ?? the letters do not say that pt. works from home/fim

## 2016-05-04 ENCOUNTER — Encounter: Payer: Self-pay | Admitting: *Deleted

## 2016-05-04 NOTE — Telephone Encounter (Signed)
I have spoken with Cathy [name].  Letter amended and mailed to her home address per her request/fim

## 2016-05-10 ENCOUNTER — Telehealth: Payer: Self-pay | Admitting: Neurology

## 2016-05-10 MED ORDER — DEXTROAMPHETAMINE SULFATE ER 10 MG PO CP24: 20.0000 mg | ORAL_CAPSULE | Freq: Every day | ORAL | 0 refills | Status: DC

## 2016-05-10 NOTE — Telephone Encounter (Signed)
Rx. awaiting RAS sig/fim 

## 2016-05-10 NOTE — Addendum Note (Signed)
Addended by: Candis Schatz I on: 05/10/2016 04:54 PM   Modules accepted: Orders

## 2016-05-10 NOTE — Telephone Encounter (Signed)
Pt called for refill of dextroamphetamine (DEXEDRINE) 10 MG 24 hr capsule, pt advised to allow 24 hours

## 2016-05-11 NOTE — Telephone Encounter (Signed)
Dexedrine rx. up front [facility]/fim 

## 2016-06-09 ENCOUNTER — Telehealth: Payer: Self-pay | Admitting: Neurology

## 2016-06-09 NOTE — Telephone Encounter (Signed)
Pt request refill for dextroamphetamine (DEXEDRINE) 10 MG 24 hr capsule . Pt is aware office closes at noon tomorrow. She is wanting to pick up RX tomorrow also. Thank you

## 2016-06-10 MED ORDER — DEXTROAMPHETAMINE SULFATE ER 10 MG PO CP24: 20.0000 mg | ORAL_CAPSULE | Freq: Every day | ORAL | 0 refills | Status: DC

## 2016-06-10 NOTE — Telephone Encounter (Signed)
Rx. up front [facility]/fim 

## 2016-06-10 NOTE — Telephone Encounter (Signed)
Rx. awaiting RAS sig/fim 

## 2016-06-13 ENCOUNTER — Telehealth: Payer: Self-pay | Admitting: *Deleted

## 2016-06-13 NOTE — Telephone Encounter (Signed)
Patient called and stated that the DEXEDRINE needs a PA. Please call and advise.

## 2016-06-13 NOTE — Telephone Encounter (Signed)
LMOM that Dexedrine PA was completed and approved today/fim

## 2016-06-13 NOTE — Telephone Encounter (Signed)
PA for Dexedrine 10mg  #360/90 completed by phone with [facility] clinical call center (phone# [phone]).  Approved for dx. of ADD (F98.8). Approved for dates 06/13/16 thru 06/13/17. Confirmation fax to follow/fim

## 2016-06-15 NOTE — Telephone Encounter (Signed)
Fax received from [facility] Clinical Call Center. Dexedrine 10mg  #360/90 approved for dates 05/14/16 thru 06/13/17.  No PA # given/fim

## 2016-07-11 ENCOUNTER — Telehealth: Payer: Self-pay | Admitting: Neurology

## 2016-07-11 MED ORDER — DEXTROAMPHETAMINE SULFATE ER 10 MG PO CP24: 20.0000 mg | ORAL_CAPSULE | Freq: Every day | ORAL | 0 refills | Status: DC

## 2016-07-11 NOTE — Telephone Encounter (Signed)
Rx. awaiting RAS sig/fim 

## 2016-07-11 NOTE — Telephone Encounter (Signed)
Pt request refill for dextroamphetamine (DEXEDRINE) 10 MG 24 hr capsule °

## 2016-07-11 NOTE — Telephone Encounter (Signed)
LMOM for Cathy [name] (identified vm) that Gilenya PA has been completed and approved.  She does not need to return this call unless she has questions/fim

## 2016-07-11 NOTE — Telephone Encounter (Signed)
Rx. up front [facility]/fim 

## 2016-07-11 NOTE — Telephone Encounter (Signed)
Pt said Gilenya needs PA

## 2016-07-11 NOTE — Telephone Encounter (Signed)
Gilenya PA completed via phone with [facility] (phone # [phone].  She has tried and failed Copaxone. PA approved thru 07/11/17.  No PA #/fim

## 2016-08-11 ENCOUNTER — Telehealth: Payer: Self-pay | Admitting: Neurology

## 2016-08-11 MED ORDER — DEXTROAMPHETAMINE SULFATE ER 10 MG PO CP24: 20.0000 mg | ORAL_CAPSULE | Freq: Every day | ORAL | 0 refills | Status: DC

## 2016-08-11 NOTE — Telephone Encounter (Signed)
Per RAS, ok for 90 day supply as pt. has been stable on long term therapy.  LMOM (identified vm) to let pt. know.  Rx. up front [facility]/fim

## 2016-08-11 NOTE — Addendum Note (Signed)
Addended by: Candis Schatz I on: 08/11/2016 10:45 AM   Modules accepted: Orders

## 2016-08-11 NOTE — Telephone Encounter (Signed)
Pt calling for refill of dextroamphetamine (DEXEDRINE) 10 MG 24 hr capsule, she would like a call to know if this is avail in a 90 day supply.

## 2016-08-11 NOTE — Telephone Encounter (Signed)
Pt calling back to inform that she has not been able to find a pharmacy that can give her the 90 day amount at this time.  Pt said she was told if they were to split up the order it would negate the remainder  so she is wanting to know if a 30 day supply Vexadrine 10 mg extended release capsules can be called in.  Please call

## 2016-08-11 NOTE — Telephone Encounter (Signed)
I have spoken with Katriana this afternoon and explained that we can't call in rx. for Dexedrine, but she can go ahead and have pharmacy fill #30 from the 90 day rx., and will provide a new rx. in 30 days.  She verbalized understanding of same/fim

## 2016-09-12 ENCOUNTER — Telehealth: Payer: Self-pay | Admitting: Neurology

## 2016-09-12 MED ORDER — DEXTROAMPHETAMINE SULFATE ER 10 MG PO CP24: 20.0000 mg | ORAL_CAPSULE | Freq: Every day | ORAL | 0 refills | Status: DC

## 2016-09-12 NOTE — Telephone Encounter (Signed)
Rx. awaiting RAS sig/fim 

## 2016-09-12 NOTE — Telephone Encounter (Signed)
Pt request refill for dextroamphetamine (DEXEDRINE) 10 MG 24 hr capsule °

## 2016-09-12 NOTE — Telephone Encounter (Signed)
Rx. up front [facility]/fim 

## 2016-09-19 MED ORDER — DEXTROAMPHETAMINE SULFATE ER 10 MG PO CP24: 20.0000 mg | ORAL_CAPSULE | Freq: Every day | ORAL | 0 refills | Status: DC

## 2016-09-19 NOTE — Telephone Encounter (Signed)
LMOM that rx. for #120, dated to be filled 09/27/16 (once the rx. for #60 that she just picked up is used) is up front [facility]/fim

## 2016-09-19 NOTE — Addendum Note (Signed)
Addended by: Candis Schatz I on: 09/19/2016 03:02 PM   Modules accepted: Orders

## 2016-09-19 NOTE — Telephone Encounter (Signed)
Patient called office in reference to dextroamphetamine (DEXEDRINE) 10 MG 24 hr capsule.  Patient states she is taking 4 pills a day and her recent prescription was only for 60 capsules.  She said she normally gets 120 capsules for 30 days.  Patient would like to know if she is able to come pick up a 90 day supply.  Please call

## 2016-09-19 NOTE — Telephone Encounter (Signed)
Rx. awaiting RAS sig/fim 

## 2016-09-28 ENCOUNTER — Telehealth: Payer: Self-pay | Admitting: *Deleted

## 2016-09-28 NOTE — Telephone Encounter (Signed)
LMOM; RAS can see her tomorrow at 9am, arrive about 20 min. early to check in.  I have already reserved this time for her.  She does not need to call back unless she can't keep that appt.  If she can't keep it, we won't count it as a noshow/fim

## 2016-09-29 ENCOUNTER — Ambulatory Visit: Payer: Self-pay | Admitting: Neurology

## 2016-09-30 ENCOUNTER — Encounter: Payer: Self-pay | Admitting: Neurology

## 2016-10-26 ENCOUNTER — Telehealth: Payer: Self-pay | Admitting: Neurology

## 2016-10-26 MED ORDER — DEXTROAMPHETAMINE SULFATE ER 10 MG PO CP24: 20.0000 mg | ORAL_CAPSULE | Freq: Every day | ORAL | 0 refills | Status: DC

## 2016-10-26 NOTE — Telephone Encounter (Signed)
Pt calling for a refill of dextroamphetamine (DEXEDRINE) 10 MG 24 hr capsule Pt states she always gets 120 pills, she would like to continue to get that amount

## 2016-10-26 NOTE — Telephone Encounter (Signed)
Rx. up front [facility]/fim 

## 2016-10-26 NOTE — Addendum Note (Signed)
Addended by: Candis Schatz I on: 10/26/2016 02:53 PM   Modules accepted: Orders

## 2016-10-26 NOTE — Telephone Encounter (Signed)
Rx. awaiting RAS sig/fim 

## 2016-11-29 ENCOUNTER — Encounter: Payer: Self-pay | Admitting: Neurology

## 2016-11-29 ENCOUNTER — Ambulatory Visit: Payer: Federal, State, Local not specified - PPO | Admitting: Neurology

## 2016-11-29 VITALS — BP 123/74 | HR 90 | Resp 16 | Ht 65.5 in | Wt 229.5 lb

## 2016-11-29 DIAGNOSIS — M5000 Cervical disc disorder with myelopathy, unspecified cervical region: Secondary | ICD-10-CM | POA: Diagnosis not present

## 2016-11-29 DIAGNOSIS — G35 Multiple sclerosis: Secondary | ICD-10-CM | POA: Diagnosis not present

## 2016-11-29 DIAGNOSIS — R269 Unspecified abnormalities of gait and mobility: Secondary | ICD-10-CM

## 2016-11-29 DIAGNOSIS — R5383 Other fatigue: Secondary | ICD-10-CM

## 2016-11-29 MED ORDER — MELOXICAM 7.5 MG PO TABS: 7.5000 mg | ORAL_TABLET | Freq: Every day | ORAL | 5 refills | Status: DC

## 2016-11-29 MED ORDER — DEXTROAMPHETAMINE SULFATE ER 10 MG PO CP24: 20.0000 mg | ORAL_CAPSULE | Freq: Every day | ORAL | 0 refills | Status: DC

## 2016-11-29 MED ORDER — IMIPRAMINE HCL 10 MG PO TABS: ORAL_TABLET | ORAL | 5 refills | Status: DC

## 2016-11-29 MED ORDER — FINGOLIMOD HCL 0.5 MG PO CAPS: ORAL_CAPSULE | ORAL | 3 refills | Status: DC

## 2016-11-29 NOTE — Progress Notes (Signed)
[facility]  PATIENT: Cathy [name] DOB: Aug 15, 1965  REFERRING DOCTOR OR PCP:  Synetta Fail  SOURCE: patient and medical records available  _________________________________   HISTORICAL  CHIEF COMPLAINT:  Chief Complaint  Patient presents with  . Multiple Sclerosis    Sts. she continues to tolerate Gilenya well.  Requests r/f of Dexedrine.  C/O occasional frequent  sharp pains in temple.  Only happens every 1-2 mos., and is very brief.  Would like to discuss last mri results/fim    HISTORY OF PRESENT ILLNESS:  Cathy [name] is a 51 y.o. woman with MS noting more gait issues.  Update 11/29/2016:   She feels her MS is stable.   No new numbness, weakness or gait issues.   Balance is still a little off but no falls.    Occasionally, one of her knees will give out but she does not fall.    Bladder is doing well with mild frequency.   Vision is doing ok.     She feels her fatigue is less of a problem on dextroamphetamine.    It also helps her focus and attention.    She denies depression.    She has insomnia (sleep maintenance > sleep onset).    She takes melatonin.  She sometimes feels that she wakes up but can't move.  She does not have much daytime sleepiness.     She occasionally gets sharp pain in her temple lasting seconds (not sure if left or right)..  It occurs less than once a week.   From 01/06/2016:    MS:   She has been on Gilenya since 2013 and is tolerating it well.  She denies any new numbness or weakness in the right arm or either leg.   MRI brain 01/22/2015 showed no acute findings.      Gait/strength/sensation:   Her right leg is dragging a more and it is more noticeable.   She stumbles and has had a few falls.  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    She gets a [employer] sensation in both legs.  Bladder:  She is doing well with minimal frequency.  She has nocturia once a  night  Vision:   She feels this is stable.  However,  she had right optic neuritis in the past and still notes right color desaturation and slightly reduced visual acuity.   No eye pain or diplopia.     Mood/cognition:    She denies any depression or anxiety. She notes cognitive issues, especially attention deficit, mild problems with verbal fluency and processing speed. She notes that cognitive problems have improved on the Dexedrine.has   Fatigue/sleep: She notes fatigue,  worse as the day goes on and with heat. She has a lot of benefit with Dexedrine 20 mg twice a day.   Sometimes, however, she gets much more tired in the evenings. She falls aslee well at night now but had insomnia last year.   Shoulder:    Her shoulder is much better since the shoulder injection at the last visit.    MS History:   She was diagnosed with MS after presenting with optic neuritis x 2 in 2009.   She had an MRI and LP and was diagnosed with MS.     She started on Copaxone and switched to Gilenya in 2013.    She denies any further exacerbation since the optic neuritis but  was told she had MS plaques on her spine when she presented last year with a cervical radiculopathy requiring surgery.    I have seen her the past year at [facility].   Her last MRI's were Jan 21, 2014.  MRI images show foci in the cerebellum and the hemispheres consistent with MS. There were no acute findings. She has NIDDM, HTN and high cholesterol.    REVIEW OF SYSTEMS: Constitutional: No fevers, chills, sweats, or change in appetite.   Has fatigue Eyes: H/o right ON with residual color desaturation Ear, nose and throat: No hearing loss, ear pain, nasal congestion, sore throat Cardiovascular: No chest pain, palpitations Respiratory: No shortness of breath at rest or with exertion.   No wheezes GastrointestinaI: No nausea, vomiting, diarrhea, abdominal pain, fecal incontinence Genitourinary: No dysuria, urinary retention or  frequency.  No nocturia. Musculoskeletal: No neck pain, back pain,. Notes left shoulder pain. Integumentary: No rash, pruritus, skin lesions Neurological: as above Psychiatric: No depression at this time.  No anxiety Endocrine: has NIDDM.  No palpitations, diaphoresis, change in appetite, change in weigh or increased thirst Hematologic/Lymphatic: No anemia, purpura, petechiae. Allergic/Immunologic: No itchy/runny eyes, nasal congestion, recent allergic reactions, rashes  ALLERGIES: Allergies  Allergen Reactions  . Aspirin     Heartburn  . Ibuprofen     heatburn    HOME MEDICATIONS:  Current Outpatient Medications:  .  albuterol (PROVENTIL) (2.5 MG/3ML) 0.083% nebulizer solution, , Disp: , Rfl: 3 .  atorvastatin (LIPITOR) 80 MG tablet, , Disp: , Rfl: 11 .  baclofen (LIORESAL) 10 MG tablet, Take one or two at bedtime, Disp: 60 each, Rfl: 11 .  Cholecalciferol (VITAMIN D) 2000 UNITS CAPS, Take 2,000 Units by mouth daily., Disp: , Rfl:  .  dextroamphetamine (DEXEDRINE) 10 MG 24 hr capsule, Take 2-4 capsules (20-40 mg total) daily by mouth., Disp: 120 capsule, Rfl: 0 .  Fingolimod HCl (GILENYA) 0.5 MG CAPS, TAKE ONE CAPSULE (0.5 MG) BY MOUTH EACH DAY. MAY TAKE WITH OR WITHOUTFOOD. STORE AT ROOM TEMPERATURE., Disp: 90 capsule, Rfl: 3 .  hydrochlorothiazide (HYDRODIURIL) 12.5 MG tablet, , Disp: , Rfl: 2 .  loratadine (CLARITIN) 10 MG tablet, Take 10 mg by mouth daily., Disp: , Rfl:  .  pantoprazole (PROTONIX) 20 MG tablet, , Disp: , Rfl: 1 .  triamcinolone cream (KENALOG) 0.1 %, Apply topically 3 (three) times daily., Disp: 45 g, Rfl: 5 .  imipramine (TOFRANIL) 10 MG tablet, One or two po qHS, Disp: 60 tablet, Rfl: 5 .  losartan-hydrochlorothiazide (HYZAAR) 50-12.5 MG per tablet, Take by mouth., Disp: , Rfl:  .  meloxicam (MOBIC) 7.5 MG tablet, Take 1 tablet (7.5 mg total) daily by mouth., Disp: 30 tablet, Rfl: 5 No current facility-administered medications for this visit.    Facility-Administered Medications Ordered in Other Visits:  .  gadopentetate dimeglumine (MAGNEVIST) injection 20 mL, 20 mL, Intravenous, Once PRN, Warrick Llera, Pearletha Furlichard A, MD  PAST MEDICAL HISTORY: Past Medical History:  Diagnosis Date  . Diabetes mellitus without complication (HCC)   . Eczema   . Hypertension   . Multiple sclerosis (HCC)   . Vision abnormalities     PAST SURGICAL HISTORY: Past Surgical History:  Procedure Laterality Date  . ABDOMINAL HYSTERECTOMY    . CARPAL TUNNEL RELEASE    . CERVICAL SPINE SURGERY     Dr. Sharolyn DouglasMax Cohen  . LASER ABLATION    . TUBAL LIGATION      FAMILY HISTORY: Family History  Problem Relation Age of Onset  .  Hypertension Mother   . Diabetes type II Mother   . COPD Father   . Multiple sclerosis Paternal Uncle   . Multiple sclerosis Paternal Grandfather     SOCIAL HISTORY:  Social History   Socioeconomic History  . Marital status: Married    Spouse name: Not on file  . Number of children: Not on file  . Years of education: Not on file  . Highest education level: Not on file  Social Needs  . Financial resource strain: Not on file  . Food insecurity - worry: Not on file  . Food insecurity - inability: Not on file  . Transportation needs - medical: Not on file  . Transportation needs - non-medical: Not on file  Occupational History  . Not on file  Tobacco Use  . Smoking status: Never Smoker  . Smokeless tobacco: Never Used  Substance and Sexual Activity  . Alcohol use: Yes    Comment: occasionally  . Drug use: No  . Sexual activity: Yes    Birth control/protection: Surgical  Other Topics Concern  . Not on file  Social History Narrative  . Not on file     PHYSICAL EXAM  Vitals:   11/29/16 1353  BP: 123/74  Pulse: 90  Resp: 16  Weight: 229 lb 8 oz (104.1 kg)  Height: 5' 5.5" (1.664 m)    Body mass index is 37.61 kg/m.   General: The patient is well-developed and well-nourished and in no acute distress  Neck:  The neck is supple with good ROM.   The neck is nontender.   Musculoskeletal:  Shoulder is non-tender now with good ROM  Neurologic Exam  Mental status: The patient is alert and oriented x 3 at the time of the examination. The patient has apparent normal recent and remote memory, with an apparently normal attention span and concentration ability.   Speech is normal.  Cranial nerves: Extraocular movements are full.  There is good facial sensation to soft touch bilaterally.Facial strength is normal.  Trapezius and sternocleidomastoid strength is normal. No dysarthria is noted.   No obvious hearing deficits are noted.  Motor:  Muscle bulk is normal.   Tone is mildly increased in legs, right > left. Strength is  5 / 5 in all 4 extremities.   Sensory: Sensory testing is intact to soft touch and vibration sensation in all 4 extremities.  Coordination: Cerebellar testing reveals good finger-nose-finger and heel-to-shin bilaterally.  Gait and station: Station is normal.   Gait is normal. Tandem gait is wide. Romberg is negative.   Reflexes: Deep tendon reflexes are symmetric and normal bilaterally.        DIAGNOSTIC DATA (LABS, IMAGING, TESTING) - I reviewed patient records, labs, notes, testing and imaging myself where available.     ASSESSMENT AND PLAN  Multiple sclerosis (HCC) - Plan: CBC with Differential/Platelet, Hepatic function panel, Ambulatory referral to Physical Therapy  Cervical disc disease with myelopathy  Gait disturbance - Plan: Ambulatory referral to Physical Therapy  Other fatigue   1.  Continue Gilenya for MS.   check labwork.  2.  Renew Dexedrine for attention deficit and fatigue.  3.  Try to remain active. Exercises tolerated.   Refer to physical therapy for evaluation of gait disturbance.  4. rtc 5-6 months or sooner if new issus  Mirabella Hilario A. Epimenio Foot, MD, PhD 11/29/2016, 3:58 PM Certified in Neurology, Clinical Neurophysiology, Sleep Medicine, Pain Medicine and  Neuroimaging  [facility] [address] [place],  [place] [address] [phone] -

## 2016-11-30 ENCOUNTER — Telehealth: Payer: Self-pay | Admitting: *Deleted

## 2016-11-30 LAB — HEPATIC FUNCTION PANEL
ALBUMIN: 4.4 g/dL (ref 3.5–5.5)
ALK PHOS: 122 IU/L — AB (ref 39–117)
ALT: 21 IU/L (ref 0–32)
AST: 16 IU/L (ref 0–40)
BILIRUBIN TOTAL: 0.2 mg/dL (ref 0.0–1.2)
Bilirubin, Direct: 0.09 mg/dL (ref 0.00–0.40)
Total Protein: 6.9 g/dL (ref 6.0–8.5)

## 2016-11-30 LAB — CBC WITH DIFFERENTIAL/PLATELET
Basophils Absolute: 0 10*3/uL (ref 0.0–0.2)
Basos: 0 %
EOS (ABSOLUTE): 0.1 10*3/uL (ref 0.0–0.4)
EOS: 2 %
HEMATOCRIT: 36.4 % (ref 34.0–46.6)
HEMOGLOBIN: 12.5 g/dL (ref 11.1–15.9)
IMMATURE GRANS (ABS): 0 10*3/uL (ref 0.0–0.1)
IMMATURE GRANULOCYTES: 0 %
Lymphocytes Absolute: 0.6 10*3/uL — ABNORMAL LOW (ref 0.7–3.1)
Lymphs: 14 %
MCH: 29.3 pg (ref 26.6–33.0)
MCHC: 34.3 g/dL (ref 31.5–35.7)
MCV: 85 fL (ref 79–97)
MONOCYTES: 13 %
Monocytes Absolute: 0.5 10*3/uL (ref 0.1–0.9)
NEUTROS PCT: 71 %
Neutrophils Absolute: 2.9 10*3/uL (ref 1.4–7.0)
Platelets: 361 10*3/uL (ref 150–379)
RBC: 4.27 x10E6/uL (ref 3.77–5.28)
RDW: 14.5 % (ref 12.3–15.4)
WBC: 4.1 10*3/uL (ref 3.4–10.8)

## 2016-11-30 NOTE — Telephone Encounter (Signed)
-----   Message from Asa Lenteichard A Sater, MD sent at 11/30/2016  8:53 AM EST ----- Please let the patient know that the lab work is fine. Low lymphocytes typical with Gilenya

## 2016-11-30 NOTE — Telephone Encounter (Signed)
LMOM (identified vm) with below lab results.  She does not need to return this call unless she has questions/fim 

## 2016-12-23 ENCOUNTER — Telehealth: Payer: Self-pay | Admitting: Neurology

## 2016-12-23 DIAGNOSIS — M67431 Ganglion, right wrist: Secondary | ICD-10-CM

## 2016-12-23 MED ORDER — DEXTROAMPHETAMINE SULFATE ER 10 MG PO CP24: 20.0000 mg | ORAL_CAPSULE | Freq: Every day | ORAL | 0 refills | Status: DC

## 2016-12-23 NOTE — Addendum Note (Signed)
Addended by: Candis SchatzMISENHEIMER, Loni Delbridge I on: 12/23/2016 11:23 AM   Modules accepted: Orders

## 2016-12-23 NOTE — Telephone Encounter (Signed)
Pt request refill for dextroamphetamine (DEXEDRINE) 10 MG 24 hr capsule. Pt said she will be in [place] when RX is due in January and wants to know how that will be handled.

## 2016-12-23 NOTE — Telephone Encounter (Signed)
Spoke with pt.  She requested ortho referral--[place] if possible, as this is closer to her home/fim

## 2016-12-23 NOTE — Telephone Encounter (Signed)
Rx's up front [facility]/fim 

## 2016-12-23 NOTE — Telephone Encounter (Signed)
Pt request referral to Ortho for ganglion cyst. She said medication is not helping. Please call to advise

## 2016-12-23 NOTE — Telephone Encounter (Signed)
2 post dated rx's also ready for RAS to sign, as pt. will be out of the state for 6 weeks, starting in Jan., for job training/fim

## 2016-12-23 NOTE — Telephone Encounter (Signed)
Rx. awaiting RAS sig/fim 

## 2016-12-29 DIAGNOSIS — D72819 Decreased white blood cell count, unspecified: Secondary | ICD-10-CM | POA: Insufficient documentation

## 2017-01-25 ENCOUNTER — Encounter: Payer: Self-pay | Admitting: *Deleted

## 2017-01-25 ENCOUNTER — Telehealth: Payer: Self-pay | Admitting: Neurology

## 2017-01-25 NOTE — Telephone Encounter (Signed)
Spoke with Raeya this afternoon and advised letter will be ready by lunchtime tomorrow.  Letter awaiting RAS sig/fim

## 2017-01-25 NOTE — Telephone Encounter (Signed)
Pt has called re: a work trip that will require her to go to [place], [place] for 6 weeks.  Pt is asking for a note due to her disability stating that because of the distance between the center and where the hotel will be instead of walking pt will be allowed to use [employer] and also be allowed to wear tennis shoes for comfort due to any and all required walking.  Please call.  Pt is scheduled to leave Monday 7th of January

## 2017-01-26 NOTE — Telephone Encounter (Signed)
Letter up front [facility]/fim 

## 2017-02-28 ENCOUNTER — Other Ambulatory Visit: Payer: Self-pay | Admitting: Neurology

## 2017-02-28 MED ORDER — DEXTROAMPHETAMINE SULFATE ER 10 MG PO CP24: 20.0000 mg | ORAL_CAPSULE | Freq: Every day | ORAL | 0 refills | Status: DC

## 2017-02-28 NOTE — Telephone Encounter (Signed)
Rx. up front [facility]/fim 

## 2017-02-28 NOTE — Telephone Encounter (Signed)
Pt's husband called said she misplaced the paper RX for dextroamphetamine (DEXEDRINE) 10 MG 24 hr capsule. He said she is out of the medication and needs to pick up another RX today if possible. Please call to advise

## 2017-02-28 NOTE — Telephone Encounter (Signed)
Placed printed/signed rx up front for patient pick up.  

## 2017-02-28 NOTE — Telephone Encounter (Signed)
Rx. awaiting RAS sig/fim 

## 2017-03-14 DIAGNOSIS — M67432 Ganglion, left wrist: Secondary | ICD-10-CM | POA: Insufficient documentation

## 2017-04-27 ENCOUNTER — Other Ambulatory Visit: Payer: Self-pay | Admitting: Neurology

## 2017-04-27 MED ORDER — DEXTROAMPHETAMINE SULFATE ER 10 MG PO CP24: 20.0000 mg | ORAL_CAPSULE | Freq: Every day | ORAL | 0 refills | Status: DC

## 2017-04-27 NOTE — Telephone Encounter (Signed)
Pt request refill for dextroamphetamine (DEXEDRINE) 10 MG 24 hr capsule

## 2017-05-29 ENCOUNTER — Telehealth: Payer: Self-pay | Admitting: *Deleted

## 2017-05-29 NOTE — Telephone Encounter (Signed)
Gilenya PA completed and faxed to [facility], fax# [phone].  Dx: RRMS (G35).  Tried and failed meds: Copaxone.  Has been on Gilenya since 2013./fim

## 2017-05-30 ENCOUNTER — Encounter: Payer: Self-pay | Admitting: Neurology

## 2017-05-30 ENCOUNTER — Other Ambulatory Visit: Payer: Self-pay

## 2017-05-30 ENCOUNTER — Ambulatory Visit: Payer: Federal, State, Local not specified - PPO | Admitting: Neurology

## 2017-05-30 VITALS — BP 136/72 | HR 96 | Resp 18 | Ht 65.5 in | Wt 232.0 lb

## 2017-05-30 DIAGNOSIS — R208 Other disturbances of skin sensation: Secondary | ICD-10-CM | POA: Diagnosis not present

## 2017-05-30 DIAGNOSIS — F988 Other specified behavioral and emotional disorders with onset usually occurring in childhood and adolescence: Secondary | ICD-10-CM

## 2017-05-30 DIAGNOSIS — R269 Unspecified abnormalities of gait and mobility: Secondary | ICD-10-CM

## 2017-05-30 DIAGNOSIS — R5383 Other fatigue: Secondary | ICD-10-CM | POA: Diagnosis not present

## 2017-05-30 DIAGNOSIS — G35 Multiple sclerosis: Secondary | ICD-10-CM | POA: Diagnosis not present

## 2017-05-30 DIAGNOSIS — Z79899 Other long term (current) drug therapy: Secondary | ICD-10-CM | POA: Diagnosis not present

## 2017-05-30 MED ORDER — DEXTROAMPHETAMINE SULFATE ER 10 MG PO CP24: 20.0000 mg | ORAL_CAPSULE | Freq: Every day | ORAL | 0 refills | Status: DC

## 2017-05-30 NOTE — Progress Notes (Signed)
[facility]  PATIENT: Cathy [name] DOB: 1965/07/20  REFERRING DOCTOR OR PCP:  Synetta Fail  SOURCE: patient and medical records available  _________________________________   HISTORICAL  CHIEF COMPLAINT:  Chief Complaint  Patient presents with  . Multiple Sclerosis    Sts. she continues to tolerate Gilenya well.  Needs r/f of Dexedrine/fim    HISTORY OF PRESENT ILLNESS:  Cathy [name] is a 52 y.o. woman with MS noting more gait issues.  Update 05/30/2017: She feels that she has been fairly stable.  She notes no exacerbations or other new MS symptoms.  She is on Gilenya and she tolerates it well.  Her gait is baseline with mildly reduced balance.  She has occasional stumbling but no falls.  She does not note any major problems with weakness or numbness.  She has some urinary urgency and frequency and rare incontinence.  She notes occasioanl constipation and has also had runny stools with 2 episodes of fecal incontinence.   Vision is fine.  Continues to report some fatigue that is both physical and cognitive.  She does better with Dexedrine and she tolerates it well.  She also has had some difficulty with focus and attention and that is also improved by the Dexedrine.   She denies any problems with mood.   She has some insomnia but notes that if she drinks a beer at bedtime she falls asleep better.       She notes sharp pains in her temple lasting 1 second at times.    Sometimes she notes pain in her back when she takes a deep breath.  Update 11/29/2016:   She feels her MS is stable.   No new numbness, weakness or gait issues.   Balance is still a little off but no falls.    Occasionally, one of her knees will give out but she does not fall.    Bladder is doing well with mild frequency.   Vision is doing ok.     She feels her fatigue is less of a problem on dextroamphetamine.    It also helps her focus and attention.    She denies depression.    She has  insomnia (sleep maintenance > sleep onset).    She takes melatonin.  She sometimes feels that she wakes up but can't move.  She does not have much daytime sleepiness.     She occasionally gets sharp pain in her temple lasting seconds (not sure if left or right)..  It occurs less than once a week.   From 01/06/2016:    MS:   She has been on Gilenya since 2013 and is tolerating it well.  She denies any new numbness or weakness in the right arm or either leg.   MRI brain 01/22/2015 showed no acute findings.      Gait/strength/sensation:   Her right leg is dragging a more and it is more noticeable.   She stumbles and has had a few falls.  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    She gets a [employer] sensation in both legs.  Bladder:  She is doing well with minimal frequency.  She has nocturia once a night  Vision:   She feels this is stable.  However,  she had right optic neuritis in the past and still notes right color desaturation and slightly reduced visual acuity.   No eye pain or diplopia.  Mood/cognition:    She denies any depression or anxiety. She notes cognitive issues, especially attention deficit, mild problems with verbal fluency and processing speed. She notes that cognitive problems have improved on the Dexedrine.has   Fatigue/sleep: She notes fatigue,  worse as the day goes on and with heat. She has a lot of benefit with Dexedrine 20 mg twice a day.   Sometimes, however, she gets much more tired in the evenings. She falls aslee well at night now but had insomnia last year.   Shoulder:    Her shoulder is much better since the shoulder injection at the last visit.    MS History:   She was diagnosed with MS after presenting with optic neuritis x 2 in 2009.   She had an MRI and LP and was diagnosed with MS.     She started on Copaxone and switched to Gilenya in 2013.    She denies any further exacerbation since the optic  neuritis but was told she had MS plaques on her spine when she presented last year with a cervical radiculopathy requiring surgery.    I have seen her the past year at [facility].   Her last MRI's were Jan 21, 2014.  MRI images show foci in the cerebellum and the hemispheres consistent with MS. There were no acute findings. She has NIDDM, HTN and high cholesterol.    REVIEW OF SYSTEMS: Constitutional: No fevers, chills, sweats, or change in appetite.   Has fatigue Eyes: H/o right ON with residual color desaturation Ear, nose and throat: No hearing loss, ear pain, nasal congestion, sore throat Cardiovascular: No chest pain, palpitations Respiratory: No shortness of breath at rest or with exertion.   No wheezes GastrointestinaI: No nausea, vomiting, diarrhea, abdominal pain, fecal incontinence Genitourinary: No dysuria, urinary retention or frequency.  No nocturia. Musculoskeletal: No neck pain, back pain,. Notes left shoulder pain. Integumentary: No rash, pruritus, skin lesions Neurological: as above Psychiatric: No depression at this time.  No anxiety Endocrine: has NIDDM.  No palpitations, diaphoresis, change in appetite, change in weigh or increased thirst Hematologic/Lymphatic: No anemia, purpura, petechiae. Allergic/Immunologic: No itchy/runny eyes, nasal congestion, recent allergic reactions, rashes  ALLERGIES: Allergies  Allergen Reactions  . Aspirin     Heartburn  . Ibuprofen     heatburn    HOME MEDICATIONS:  Current Outpatient Medications:  .  atorvastatin (LIPITOR) 80 MG tablet, , Disp: , Rfl: 11 .  Cholecalciferol (VITAMIN D) 2000 UNITS CAPS, Take 2,000 Units by mouth daily., Disp: , Rfl:  .  dextroamphetamine (DEXEDRINE) 10 MG 24 hr capsule, Take 2-4 capsules (20-40 mg total) by mouth daily., Disp: 120 capsule, Rfl: 0 .  Fingolimod HCl (GILENYA) 0.5 MG CAPS, TAKE ONE CAPSULE (0.5 MG) BY MOUTH EACH DAY. MAY TAKE WITH OR WITHOUTFOOD. STORE AT ROOM  TEMPERATURE., Disp: 90 capsule, Rfl: 3 .  hydrochlorothiazide (HYDRODIURIL) 12.5 MG tablet, , Disp: , Rfl: 2 .  loratadine (CLARITIN) 10 MG tablet, Take 10 mg by mouth daily., Disp: , Rfl:  .  meloxicam (MOBIC) 7.5 MG tablet, Take 1 tablet (7.5 mg total) daily by mouth., Disp: 30 tablet, Rfl: 5 .  pantoprazole (PROTONIX) 20 MG tablet, , Disp: , Rfl: 1 .  triamcinolone cream (KENALOG) 0.1 %, Apply topically 3 (three) times daily., Disp: 45 g, Rfl: 5 .  albuterol (PROVENTIL) (2.5 MG/3ML) 0.083% nebulizer solution, , Disp: , Rfl: 3 .  losartan-hydrochlorothiazide (HYZAAR) 50-12.5 MG per tablet, Take by mouth., Disp: , Rfl:  No current facility-administered medications for this visit.   Facility-Administered Medications Ordered in Other Visits:  .  gadopentetate dimeglumine (MAGNEVIST) injection 20 mL, 20 mL, Intravenous, Once PRN, Peri Kreft, Pearletha Furl, MD  PAST MEDICAL HISTORY: Past Medical History:  Diagnosis Date  . Diabetes mellitus without complication (HCC)   . Eczema   . Hypertension   . Multiple sclerosis (HCC)   . Vision abnormalities     PAST SURGICAL HISTORY: Past Surgical History:  Procedure Laterality Date  . ABDOMINAL HYSTERECTOMY    . CARPAL TUNNEL RELEASE    . CERVICAL SPINE SURGERY     Dr. Sharolyn Douglas  . LASER ABLATION    . TUBAL LIGATION      FAMILY HISTORY: Family History  Problem Relation Age of Onset  . Hypertension Mother   . Diabetes type II Mother   . COPD Father   . Multiple sclerosis Paternal Uncle   . Multiple sclerosis Paternal Grandfather     SOCIAL HISTORY:  Social History   Socioeconomic History  . Marital status: Married    Spouse name: Not on file  . Number of children: Not on file  . Years of education: Not on file  . Highest education level: Not on file  Occupational History  . Not on file  Social Needs  . Financial resource strain: Not on file  . Food insecurity:    Worry: Not on file    Inability: Not on file  . Transportation  needs:    Medical: Not on file    Non-medical: Not on file  Tobacco Use  . Smoking status: Never Smoker  . Smokeless tobacco: Never Used  Substance and Sexual Activity  . Alcohol use: Yes    Comment: occasionally  . Drug use: No  . Sexual activity: Yes    Birth control/protection: Surgical  Lifestyle  . Physical activity:    Days per week: Not on file    Minutes per session: Not on file  . Stress: Not on file  Relationships  . Social connections:    Talks on phone: Not on file    Gets together: Not on file    Attends religious service: Not on file    Active member of club or organization: Not on file    Attends meetings of clubs or organizations: Not on file    Relationship status: Not on file  . Intimate partner violence:    Fear of current or ex partner: Not on file    Emotionally abused: Not on file    Physically abused: Not on file    Forced sexual activity: Not on file  Other Topics Concern  . Not on file  Social History Narrative  . Not on file     PHYSICAL EXAM  Vitals:   05/30/17 1300  BP: 136/72  Pulse: 96  Resp: 18  Weight: 232 lb (105.2 kg)  Height: 5' 5.5" (1.664 m)    Body mass index is 38.02 kg/m.   General: The patient is well-developed and well-nourished and in no acute distress   Neurologic Exam  Mental status: The patient is alert and oriented x 3 at the time of the examination. The patient has apparent normal recent and remote memory, with an apparently normal attention span and concentration ability.   Speech is normal.  Cranial nerves: Extraocular movements are full.  Facial strength and sensation was normal.  Trapezius strength was normal.. No dysarthria is noted.   No obvious hearing deficits are noted.  Motor:  Muscle bulk is normal.   She has mildly increased muscle tone in the legs, right more than left.  Strength was 5/5.Marland Kitchen   Sensory: Sensory testing is intact to soft touch and vibration sensation in all 4  extremities.  Coordination: Cerebellar testing reveals good finger-nose-finger and heel-to-shin bilaterally.  Gait and station: Station is normal.   The gait is fairly normal but the tandem gait is wide.. Romberg is negative.   Reflexes: Deep tendon reflexes are symmetric and normal bilaterally.        DIAGNOSTIC DATA (LABS, IMAGING, TESTING) - I reviewed patient records, labs, notes, testing and imaging myself where available.     ASSESSMENT AND PLAN  Multiple sclerosis (HCC) - Plan: CBC with Differential/Platelet, Hepatic function panel, MR BRAIN W WO CONTRAST  Other fatigue  High risk medication use  Attention deficit disorder, unspecified hyperactivity presence  Gait disturbance  Dysesthesia   1.  Continue Gilenya as a disease modifying therapy for MS.  We will check some lab work today.  Additionally we need to check an MRI of the brain to make sure that she is not having any subclinical progression.  If present, consider a switch to a different disease might 2.  Dexedrine for attention deficit and fatigue..  3.  Try to remain active. Exercises tolerated.   We discussed weight loss. 4. rtc 5-6 months or sooner if new issus  Chauncy Mangiaracina A. Epimenio Foot, MD, PhD 05/30/2017, 1:33 PM Certified in Neurology, Clinical Neurophysiology, Sleep Medicine, Pain Medicine and Neuroimaging  [facility] [address], [address] [place], [place] [address] [phone] -

## 2017-05-31 ENCOUNTER — Telehealth: Payer: Self-pay | Admitting: *Deleted

## 2017-05-31 ENCOUNTER — Telehealth: Payer: Self-pay | Admitting: Neurology

## 2017-05-31 LAB — CBC WITH DIFFERENTIAL/PLATELET
Basophils Absolute: 0 10*3/uL (ref 0.0–0.2)
Basos: 1 %
EOS (ABSOLUTE): 0.1 10*3/uL (ref 0.0–0.4)
EOS: 2 %
HEMATOCRIT: 35.7 % (ref 34.0–46.6)
HEMOGLOBIN: 11.8 g/dL (ref 11.1–15.9)
IMMATURE GRANS (ABS): 0 10*3/uL (ref 0.0–0.1)
Immature Granulocytes: 0 %
LYMPHS ABS: 0.5 10*3/uL — AB (ref 0.7–3.1)
Lymphs: 12 %
MCH: 28.7 pg (ref 26.6–33.0)
MCHC: 33.1 g/dL (ref 31.5–35.7)
MCV: 87 fL (ref 79–97)
MONOCYTES: 10 %
Monocytes Absolute: 0.4 10*3/uL (ref 0.1–0.9)
Neutrophils Absolute: 3.4 10*3/uL (ref 1.4–7.0)
Neutrophils: 75 %
Platelets: 357 10*3/uL (ref 150–379)
RBC: 4.11 x10E6/uL (ref 3.77–5.28)
RDW: 14.6 % (ref 12.3–15.4)
WBC: 4.4 10*3/uL (ref 3.4–10.8)

## 2017-05-31 LAB — HEPATIC FUNCTION PANEL
ALBUMIN: 4.3 g/dL (ref 3.5–5.5)
ALK PHOS: 103 IU/L (ref 39–117)
ALT: 22 IU/L (ref 0–32)
AST: 14 IU/L (ref 0–40)
BILIRUBIN TOTAL: 0.3 mg/dL (ref 0.0–1.2)
BILIRUBIN, DIRECT: 0.11 mg/dL (ref 0.00–0.40)
Total Protein: 6.6 g/dL (ref 6.0–8.5)

## 2017-05-31 NOTE — Telephone Encounter (Signed)
MR Brain w/wo contrast Dr. Gershon Mussel Fed Auth: NPR Ref # [id number]. Patient is scheduled for 06/07/17 for the [facility] mobile unit.

## 2017-05-31 NOTE — Telephone Encounter (Signed)
-----   Message from Asa Lente, MD sent at 05/31/2017  9:30 AM EDT ----- Please let the patient know that the lab work is fine.  Low lymphocytes ok with gilenya

## 2017-05-31 NOTE — Telephone Encounter (Signed)
Spoke with Lylee and advised lab work done in our office is fine.  She verbalized understanding of same/fim

## 2017-06-01 NOTE — Telephone Encounter (Signed)
Fax received from [facility] FEP 9phone# [phone], fax# [phone]). Gilenya pA approved for dates 04/30/17 thru 05/30/18. No PA#/fim

## 2017-06-07 ENCOUNTER — Ambulatory Visit: Payer: Federal, State, Local not specified - PPO

## 2017-06-07 ENCOUNTER — Encounter: Payer: Self-pay | Admitting: Neurology

## 2017-06-07 DIAGNOSIS — G35 Multiple sclerosis: Secondary | ICD-10-CM | POA: Diagnosis not present

## 2017-06-07 MED ORDER — GADOPENTETATE DIMEGLUMINE 469.01 MG/ML IV SOLN
20.0000 mL | Freq: Once | INTRAVENOUS | Status: AC | PRN
  Administered 2017-06-07: 20 mL via INTRAVENOUS

## 2017-06-09 ENCOUNTER — Telehealth: Payer: Self-pay | Admitting: *Deleted

## 2017-06-09 NOTE — Telephone Encounter (Signed)
Spoke to patient she is aware of MRI results ?

## 2017-06-09 NOTE — Telephone Encounter (Signed)
-----   Message from Asa Lente, MD sent at 06/09/2017  1:08 PM EDT ----- Please let her know that the MRI of the brain did not show any new lesions.

## 2017-07-03 ENCOUNTER — Telehealth: Payer: Self-pay | Admitting: Neurology

## 2017-07-03 MED ORDER — DEXTROAMPHETAMINE SULFATE ER 10 MG PO CP24: 20.0000 mg | ORAL_CAPSULE | Freq: Every day | ORAL | 0 refills | Status: DC

## 2017-07-03 NOTE — Telephone Encounter (Signed)
Pt called stating [facility] has informed her that insurance will need a PA for Dexedrine

## 2017-07-03 NOTE — Addendum Note (Signed)
Addended by: Candis Schatz I on: 07/03/2017 09:03 AM   Modules accepted: Orders

## 2017-07-03 NOTE — Telephone Encounter (Signed)
PA for Dexedrine 10mg  ER capsules, 4 capsules per day completed via phone with [employer], phone# [phone]. Member ID: [id number].  PA approved thru 07/03/18.  Approval info faxed to pt's pharmacy  ([facility])/fim

## 2017-07-03 NOTE — Telephone Encounter (Signed)
Patient requesting refill of dextroamphetamine (DEXEDRINE) 10 MG 24 hr capsule sent to [facility] on [address] in [place]. She said medication was called there last month. Patient is out of medication.

## 2017-07-05 NOTE — Telephone Encounter (Signed)
Fax received from [facility], phone# [phone].  Dexedrine approved for dates 06/03/17 thru 07/03/18.  No PA#/fim

## 2017-07-21 DIAGNOSIS — M659 Synovitis and tenosynovitis, unspecified: Secondary | ICD-10-CM | POA: Diagnosis not present

## 2017-07-31 ENCOUNTER — Telehealth: Payer: Self-pay | Admitting: Neurology

## 2017-07-31 MED ORDER — DEXTROAMPHETAMINE SULFATE ER 10 MG PO CP24: 20.0000 mg | ORAL_CAPSULE | Freq: Every day | ORAL | 0 refills | Status: DC

## 2017-07-31 NOTE — Telephone Encounter (Signed)
Pt requesting refills for dextroamphetamine (DEXEDRINE) 10 MG 24 hr capsule sent to [facility]

## 2017-08-22 DIAGNOSIS — I10 Essential (primary) hypertension: Secondary | ICD-10-CM | POA: Diagnosis not present

## 2017-08-22 DIAGNOSIS — E1165 Type 2 diabetes mellitus with hyperglycemia: Secondary | ICD-10-CM | POA: Diagnosis not present

## 2017-08-22 DIAGNOSIS — E1169 Type 2 diabetes mellitus with other specified complication: Secondary | ICD-10-CM | POA: Diagnosis not present

## 2017-08-22 DIAGNOSIS — E1159 Type 2 diabetes mellitus with other circulatory complications: Secondary | ICD-10-CM | POA: Diagnosis not present

## 2017-08-22 DIAGNOSIS — Z1231 Encounter for screening mammogram for malignant neoplasm of breast: Secondary | ICD-10-CM | POA: Diagnosis not present

## 2017-08-28 ENCOUNTER — Telehealth: Payer: Self-pay | Admitting: Neurology

## 2017-08-28 ENCOUNTER — Encounter: Payer: Self-pay | Admitting: *Deleted

## 2017-08-28 NOTE — Telephone Encounter (Signed)
Pt request a note stating that she is a pt of Dr. Bonnita Hollow and has MS. Pt would like to pick the note up when available

## 2017-08-28 NOTE — Telephone Encounter (Signed)
Spoke with Cathy [name].  Letter printed and awaiting [facility] sig/fim

## 2017-08-28 NOTE — Telephone Encounter (Signed)
Letter up front [facility]/fim 

## 2017-08-30 ENCOUNTER — Other Ambulatory Visit: Payer: Self-pay | Admitting: Neurology

## 2017-08-30 MED ORDER — DEXTROAMPHETAMINE SULFATE ER 10 MG PO CP24: 20.0000 mg | ORAL_CAPSULE | Freq: Every day | ORAL | 0 refills | Status: DC

## 2017-08-30 NOTE — Telephone Encounter (Signed)
Pt requesting refills for dextroamphetamine (DEXEDRINE) 10 MG 24 hr capsule sent to [facility] on [address]

## 2017-09-27 ENCOUNTER — Telehealth: Payer: Self-pay | Admitting: Neurology

## 2017-09-27 MED ORDER — DEXTROAMPHETAMINE SULFATE ER 10 MG PO CP24: 20.0000 mg | ORAL_CAPSULE | Freq: Every day | ORAL | 0 refills | Status: DC

## 2017-09-27 NOTE — Telephone Encounter (Signed)
Pt requesting a refill for dextroamphetamine (DEXEDRINE) 10 MG 24 hr capsule sent to [facility]

## 2017-09-28 DIAGNOSIS — N760 Acute vaginitis: Secondary | ICD-10-CM | POA: Diagnosis not present

## 2017-09-28 DIAGNOSIS — Z1151 Encounter for screening for human papillomavirus (HPV): Secondary | ICD-10-CM | POA: Diagnosis not present

## 2017-09-28 DIAGNOSIS — Z01419 Encounter for gynecological examination (general) (routine) without abnormal findings: Secondary | ICD-10-CM | POA: Diagnosis not present

## 2017-09-28 DIAGNOSIS — E1165 Type 2 diabetes mellitus with hyperglycemia: Secondary | ICD-10-CM | POA: Diagnosis not present

## 2017-09-28 DIAGNOSIS — Z1231 Encounter for screening mammogram for malignant neoplasm of breast: Secondary | ICD-10-CM | POA: Diagnosis not present

## 2017-09-28 DIAGNOSIS — Z23 Encounter for immunization: Secondary | ICD-10-CM | POA: Diagnosis not present

## 2017-09-28 DIAGNOSIS — Z124 Encounter for screening for malignant neoplasm of cervix: Secondary | ICD-10-CM | POA: Diagnosis not present

## 2017-09-28 NOTE — Telephone Encounter (Signed)
Pt called stating she only has 2 tablets left, stating she will run out before 9/8. Requesting fill date be changed on rx please call to advise

## 2017-09-28 NOTE — Telephone Encounter (Signed)
Spoke with Cathy [name]. August Dexedrine rx. was dated to be filled 8/9, Sept. rx. dated correctly for 9/8. She verbalized understanding of same,is very pleasant and reasonable.  Sts. she believes the pharmacy is shorting her pills. This is not the first time she has been out of med early, but sts. is not taking more than the rx'd dose. I have explained that since this is a controlled substance, [facility] is not able to fill it early, and have encouraged her to file a complaint with her pharmacy if she believes they are given her the incorrect number of tablets/fim

## 2017-10-30 ENCOUNTER — Other Ambulatory Visit: Payer: Self-pay | Admitting: Neurology

## 2017-10-30 MED ORDER — DEXTROAMPHETAMINE SULFATE ER 10 MG PO CP24: 20.0000 mg | ORAL_CAPSULE | Freq: Every day | ORAL | 0 refills | Status: DC

## 2017-10-30 NOTE — Addendum Note (Signed)
Addended by: Candis Schatz I on: 10/30/2017 09:21 AM   Modules accepted: Orders

## 2017-10-30 NOTE — Telephone Encounter (Signed)
Patient requesting refill of dextroamphetamine (DEXEDRINE) 10 MG 24 hr capsule sent to [facility] on [address] in [place]. She is requesting to get a day earlier since 31 days in this month.

## 2017-11-23 DIAGNOSIS — E1165 Type 2 diabetes mellitus with hyperglycemia: Secondary | ICD-10-CM | POA: Diagnosis not present

## 2017-11-23 DIAGNOSIS — E1169 Type 2 diabetes mellitus with other specified complication: Secondary | ICD-10-CM | POA: Diagnosis not present

## 2017-11-23 DIAGNOSIS — I10 Essential (primary) hypertension: Secondary | ICD-10-CM | POA: Diagnosis not present

## 2017-11-23 DIAGNOSIS — E1159 Type 2 diabetes mellitus with other circulatory complications: Secondary | ICD-10-CM | POA: Diagnosis not present

## 2017-11-29 ENCOUNTER — Other Ambulatory Visit: Payer: Self-pay | Admitting: Neurology

## 2017-11-29 MED ORDER — DEXTROAMPHETAMINE SULFATE ER 10 MG PO CP24: 20.0000 mg | ORAL_CAPSULE | Freq: Every day | ORAL | 0 refills | Status: DC

## 2017-11-29 NOTE — Telephone Encounter (Signed)
Pt requesting a refill for dextroamphetamine (DEXEDRINE) 10 MG 24 hr capsule sent to [facility]  °

## 2017-12-01 ENCOUNTER — Other Ambulatory Visit: Payer: Self-pay | Admitting: Neurology

## 2017-12-01 MED ORDER — DEXTROAMPHETAMINE SULFATE ER 10 MG PO CP24: 20.0000 mg | ORAL_CAPSULE | Freq: Every day | ORAL | 0 refills | Status: DC

## 2017-12-05 ENCOUNTER — Ambulatory Visit: Payer: Federal, State, Local not specified - PPO | Admitting: Neurology

## 2017-12-20 ENCOUNTER — Ambulatory Visit: Payer: Federal, State, Local not specified - PPO | Admitting: Neurology

## 2017-12-20 ENCOUNTER — Encounter: Payer: Self-pay | Admitting: Neurology

## 2017-12-20 VITALS — BP 138/84 | HR 60 | Ht 65.5 in | Wt 222.5 lb

## 2017-12-20 DIAGNOSIS — G35 Multiple sclerosis: Secondary | ICD-10-CM

## 2017-12-20 DIAGNOSIS — R5383 Other fatigue: Secondary | ICD-10-CM

## 2017-12-20 DIAGNOSIS — R269 Unspecified abnormalities of gait and mobility: Secondary | ICD-10-CM

## 2017-12-20 DIAGNOSIS — Z79899 Other long term (current) drug therapy: Secondary | ICD-10-CM

## 2017-12-20 DIAGNOSIS — H469 Unspecified optic neuritis: Secondary | ICD-10-CM

## 2017-12-20 DIAGNOSIS — F988 Other specified behavioral and emotional disorders with onset usually occurring in childhood and adolescence: Secondary | ICD-10-CM

## 2017-12-20 MED ORDER — DEXTROAMPHETAMINE SULFATE ER 10 MG PO CP24: 20.0000 mg | ORAL_CAPSULE | Freq: Every day | ORAL | 0 refills | Status: DC

## 2017-12-20 NOTE — Progress Notes (Signed)
[facility]  PATIENT: Cathy [name] DOB: 02-23-1965  REFERRING DOCTOR OR PCP:  Synetta Fail  SOURCE: patient and medical records available  _________________________________   HISTORICAL  CHIEF COMPLAINT:  Chief Complaint  Patient presents with  . Follow-up    RM 13, alone. Last seen 05/30/17. No falls in last year.   . Multiple Sclerosis    Doing well on Gilenya. No SE.   Marland Kitchen Eye Problem    Vision has worsened. Denies having sx that she had when she had optic neuritis. Needs to go to eye doctor for check up. Has not been in awhile.     HISTORY OF PRESENT ILLNESS:  Cathy [name] is a 52 y.o. woman with MS noting more gait issues.  Update 12/20/2017: She is on Gilenya as her DMT and tolerates it well.   No definite exacerbations,.   However, she notes that the right eye vision seems worse.   She notes this especially in dim light.     She had optic neuritis in the past on the right and does note reduced contrast and color vision during the day which is chronic.    Her gait is doing well   She denies any falls.   Her right leg seems to limp more.   Gait is worse when tired or hot.   She has some RLS symptoms in the right leg and it seems to cramp up some.   Moving helps. She has some insomnia due to the leg discomfort.   Gabapentin had a hangover in the past.   She had not tried baclofen or Ropinirole in the past.   Bladder is doing well.  She notes fatigue and some reduced focus/attnetion.     Dexedrine helps her fatigue and attnetion.  However, she still finds learning to be difficult.    Mood is doing well.     Update 05/30/2017: She feels that she has been fairly stable.  She notes no exacerbations or other new MS symptoms.  She is on Gilenya and she tolerates it well.  Her gait is baseline with mildly reduced balance.  She has occasional stumbling but no falls.  She does not note any major problems with weakness or numbness.  She has some urinary urgency and  frequency and rare incontinence.  She notes occasioanl constipation and has also had runny stools with 2 episodes of fecal incontinence.   Vision is fine.  Continues to report some fatigue that is both physical and cognitive.  She does better with Dexedrine and she tolerates it well.  She also has had some difficulty with focus and attention and that is also improved by the Dexedrine.   She denies any problems with mood.   She has some insomnia but notes that if she drinks a beer at bedtime she falls asleep better.       She notes sharp pains in her temple lasting 1 second at times.    Sometimes she notes pain in her back when she takes a deep breath.  Update 11/29/2016:   She feels her MS is stable.   No new numbness, weakness or gait issues.   Balance is still a little off but no falls.    Occasionally, one of her knees will give out but she does not fall.    Bladder is doing well with mild frequency.   Vision is doing ok.     She feels her fatigue is less of a problem on dextroamphetamine.  It also helps her focus and attention.    She denies depression.    She has insomnia (sleep maintenance > sleep onset).    She takes melatonin.  She sometimes feels that she wakes up but can't move.  She does not have much daytime sleepiness.     She occasionally gets sharp pain in her temple lasting seconds (not sure if left or right)..  It occurs less than once a week.   From 01/06/2016:    MS:   She has been on Gilenya since 2013 and is tolerating it well.  She denies any new numbness or weakness in the right arm or either leg.   MRI brain 01/22/2015 showed no acute findings.      Gait/strength/sensation:   Her right leg is dragging a more and it is more noticeable.   She stumbles and has had a few falls.  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    She gets a [employer] sensation in both legs.  Bladder:  She is doing well with minimal frequency.   She has nocturia once a night  Vision:   She feels this is stable.  However,  she had right optic neuritis in the past and still notes right color desaturation and slightly reduced visual acuity.   No eye pain or diplopia.     Mood/cognition:    She denies any depression or anxiety. She notes cognitive issues, especially attention deficit, mild problems with verbal fluency and processing speed. She notes that cognitive problems have improved on the Dexedrine.has   Fatigue/sleep: She notes fatigue,  worse as the day goes on and with heat. She has a lot of benefit with Dexedrine 20 mg twice a day.   Sometimes, however, she gets much more tired in the evenings. She falls aslee well at night now but had insomnia last year.   Shoulder:    Her shoulder is much better since the shoulder injection at the last visit.    MS History:   She was diagnosed with MS after presenting with optic neuritis x 2 in 2009.   She had an MRI and LP and was diagnosed with MS.     She started on Copaxone and switched to Gilenya in 2013.    She denies any further exacerbation since the optic neuritis but was told she had MS plaques on her spine when she presented last year with a cervical radiculopathy requiring surgery.    I have seen her the past year at [facility].   Her last MRI's were Jan 21, 2014.  MRI images show foci in the cerebellum and the hemispheres consistent with MS. There were no acute findings. She has NIDDM, HTN and high cholesterol.    REVIEW OF SYSTEMS: Constitutional: No fevers, chills, sweats, or change in appetite.   Has fatigue Eyes: H/o right ON with residual color desaturation Ear, nose and throat: No hearing loss, ear pain, nasal congestion, sore throat Cardiovascular: No chest pain, palpitations Respiratory: No shortness of breath at rest or with exertion.   No wheezes GastrointestinaI: No nausea, vomiting, diarrhea, abdominal pain, fecal incontinence Genitourinary: No dysuria,  urinary retention or frequency.  No nocturia. Musculoskeletal: No neck pain, back pain,. Notes left shoulder pain. Integumentary: No rash, pruritus, skin lesions Neurological: as above Psychiatric: No depression at this time.  No anxiety Endocrine: has NIDDM.  No palpitations, diaphoresis, change in appetite, change in weigh  or increased thirst Hematologic/Lymphatic: No anemia, purpura, petechiae. Allergic/Immunologic: No itchy/runny eyes, nasal congestion, recent allergic reactions, rashes  ALLERGIES: Allergies  Allergen Reactions  . Aspirin     Heartburn  . Ibuprofen     heatburn    HOME MEDICATIONS:  Current Outpatient Medications:  .  albuterol (PROVENTIL) (2.5 MG/3ML) 0.083% nebulizer solution, , Disp: , Rfl: 3 .  atorvastatin (LIPITOR) 80 MG tablet, , Disp: , Rfl: 11 .  Cholecalciferol (VITAMIN D) 2000 UNITS CAPS, Take 2,000 Units by mouth daily., Disp: , Rfl:  .  dextroamphetamine (DEXEDRINE) 10 MG 24 hr capsule, Take 2-4 capsules (20-40 mg total) by mouth daily., Disp: 120 capsule, Rfl: 0 .  Empagliflozin (JARDIANCE PO), Take by mouth., Disp: , Rfl:  .  Fingolimod HCl (GILENYA) 0.5 MG CAPS, TAKE ONE CAPSULE (0.5 MG) BY MOUTH EACH DAY. MAY TAKE WITH OR WITHOUTFOOD. STORE AT ROOM TEMPERATURE., Disp: 90 capsule, Rfl: 3 .  hydrochlorothiazide (HYDRODIURIL) 12.5 MG tablet, , Disp: , Rfl: 2 .  loratadine (CLARITIN) 10 MG tablet, Take 10 mg by mouth daily., Disp: , Rfl:  .  losartan-hydrochlorothiazide (HYZAAR) 50-12.5 MG per tablet, Take by mouth., Disp: , Rfl:  .  meloxicam (MOBIC) 7.5 MG tablet, Take 1 tablet (7.5 mg total) daily by mouth., Disp: 30 tablet, Rfl: 5 .  pantoprazole (PROTONIX) 20 MG tablet, , Disp: , Rfl: 1 .  triamcinolone cream (KENALOG) 0.1 %, Apply topically 3 (three) times daily., Disp: 45 g, Rfl: 5 No current facility-administered medications for this visit.   Facility-Administered Medications Ordered in Other Visits:  .  gadopentetate dimeglumine  (MAGNEVIST) injection 20 mL, 20 mL, Intravenous, Once PRN, Jahmeek Shirk, Pearletha Furl, MD  PAST MEDICAL HISTORY: Past Medical History:  Diagnosis Date  . Diabetes mellitus without complication (HCC)   . Eczema   . Hypertension   . Multiple sclerosis (HCC)   . Vision abnormalities     PAST SURGICAL HISTORY: Past Surgical History:  Procedure Laterality Date  . ABDOMINAL HYSTERECTOMY    . CARPAL TUNNEL RELEASE    . CERVICAL SPINE SURGERY     Dr. Sharolyn Douglas  . LASER ABLATION    . TUBAL LIGATION      FAMILY HISTORY: Family History  Problem Relation Age of Onset  . Hypertension Mother   . Diabetes type II Mother   . COPD Father   . Multiple sclerosis Paternal Uncle   . Multiple sclerosis Paternal Grandfather     SOCIAL HISTORY:  Social History   Socioeconomic History  . Marital status: Married    Spouse name: Anothony  . Number of children: 3  . Years of education: 12+  . Highest education level: Not on file  Occupational History  . Not on file  Social Needs  . Financial resource strain: Not on file  . Food insecurity:    Worry: Not on file    Inability: Not on file  . Transportation needs:    Medical: Not on file    Non-medical: Not on file  Tobacco Use  . Smoking status: Never Smoker  . Smokeless tobacco: Never Used  Substance and Sexual Activity  . Alcohol use: Yes    Comment: occasionally  . Drug use: No  . Sexual activity: Yes    Birth control/protection: Surgical  Lifestyle  . Physical activity:    Days per week: Not on file    Minutes per session: Not on file  . Stress: Not on file  Relationships  . Social connections:  Talks on phone: Not on file    Gets together: Not on file    Attends religious service: Not on file    Active member of club or organization: Not on file    Attends meetings of clubs or organizations: Not on file    Relationship status: Not on file  . Intimate partner violence:    Fear of current or ex partner: Not on file     Emotionally abused: Not on file    Physically abused: Not on file    Forced sexual activity: Not on file  Other Topics Concern  . Not on file  Social History Narrative   Right handed    Caffeine WUJ:WJXB sometimes.   Lives with husband,  Ethelene Browns     PHYSICAL EXAM  Vitals:   12/20/17 1541  BP: 138/84  Pulse: 60  Weight: 222 lb 8 oz (100.9 kg)  Height: 5' 5.5" (1.664 m)    Body mass index is 36.46 kg/m.   General: The patient is well-developed and well-nourished and in no acute distress   Neurologic Exam  Mental status: The patient is alert and oriented x 3 at the time of the examination. The patient has apparent normal recent and remote memory, with an apparently normal attention span and concentration ability.   Speech is normal.  Cranial nerves: Extraocular movements are full.  Facial strength and sensation was normal.  Trapezius strength is normal.     No obvious hearing deficits are noted.  Motor:  Muscle bulk is normal.   There is mildly increased muscle tone in the right leg.  Strength was 5/5.  Sensory: Sensory testing is intact to soft touch and vibration sensation in all 4 extremities.  Coordination: Cerebellar testing reveals good finger-nose-finger and reduced right heel to shin.  Gait and station: Station is normal.   The gait is near normal but tandem gait is wide. . Romberg is negative.   Reflexes: Deep tendon reflexes are symmetric and normal bilaterally.        DIAGNOSTIC DATA (LABS, IMAGING, TESTING) - I reviewed patient records, labs, notes, testing and imaging myself where available.     ASSESSMENT AND PLAN  Multiple sclerosis (HCC) - Plan: CBC with Differential/Platelet, Hepatic function panel  High risk medication use - Plan: CBC with Differential/Platelet, Hepatic function panel  Optic neuritis, right  Other fatigue  Attention deficit disorder, unspecified hyperactivity presence  Gait disturbance   1.  She will continue Gilenya.   Check lab work today.   2.  Renew Dexedrine for attention deficit and fatigue..     3.  Try to remain active. Exercises tolerated.   We discussed weight loss. 4. rtc 5-6 months or sooner if new issus  Raelea Gosse A. Epimenio Foot, MD, PhD 12/20/2017, 5:02 PM Certified in Neurology, Clinical Neurophysiology, Sleep Medicine, Pain Medicine and Neuroimaging  [facility] [address] [place], [place] [address] [phone] -

## 2017-12-21 LAB — CBC WITH DIFFERENTIAL/PLATELET
BASOS ABS: 0 10*3/uL (ref 0.0–0.2)
Basos: 1 %
EOS (ABSOLUTE): 0.1 10*3/uL (ref 0.0–0.4)
Eos: 2 %
Hematocrit: 38.5 % (ref 34.0–46.6)
Hemoglobin: 12.9 g/dL (ref 11.1–15.9)
Immature Grans (Abs): 0 10*3/uL (ref 0.0–0.1)
Immature Granulocytes: 0 %
LYMPHS ABS: 0.7 10*3/uL (ref 0.7–3.1)
Lymphs: 14 %
MCH: 28.4 pg (ref 26.6–33.0)
MCHC: 33.5 g/dL (ref 31.5–35.7)
MCV: 85 fL (ref 79–97)
MONOS ABS: 0.6 10*3/uL (ref 0.1–0.9)
Monocytes: 12 %
NEUTROS ABS: 3.2 10*3/uL (ref 1.4–7.0)
Neutrophils: 71 %
Platelets: 383 10*3/uL (ref 150–450)
RBC: 4.54 x10E6/uL (ref 3.77–5.28)
RDW: 14 % (ref 12.3–15.4)
WBC: 4.5 10*3/uL (ref 3.4–10.8)

## 2017-12-21 LAB — HEPATIC FUNCTION PANEL
ALBUMIN: 4.5 g/dL (ref 3.5–5.5)
ALT: 17 IU/L (ref 0–32)
AST: 11 IU/L (ref 0–40)
Alkaline Phosphatase: 126 IU/L — ABNORMAL HIGH (ref 39–117)
Bilirubin Total: 0.4 mg/dL (ref 0.0–1.2)
Bilirubin, Direct: 0.11 mg/dL (ref 0.00–0.40)
TOTAL PROTEIN: 6.5 g/dL (ref 6.0–8.5)

## 2017-12-25 ENCOUNTER — Telehealth: Payer: Self-pay | Admitting: *Deleted

## 2017-12-25 NOTE — Telephone Encounter (Signed)
Called and spoke with pt. Advised lab work is fine per Dr. Sater. She verbalized understanding. 

## 2017-12-25 NOTE — Telephone Encounter (Signed)
-----   Message from Richard A Sater, MD sent at 12/24/2017  4:19 PM EST ----- Please let the patient know that the lab work is fine.  

## 2018-01-30 ENCOUNTER — Other Ambulatory Visit: Payer: Self-pay | Admitting: Neurology

## 2018-01-30 MED ORDER — DEXTROAMPHETAMINE SULFATE ER 10 MG PO CP24: 20.0000 mg | ORAL_CAPSULE | Freq: Every day | ORAL | 0 refills | Status: DC

## 2018-01-30 NOTE — Addendum Note (Signed)
Addended by: Hillis Range on: 01/30/2018 03:18 PM   Modules accepted: Orders

## 2018-01-30 NOTE — Telephone Encounter (Signed)
Checked drug registry. She last refilled 12/31/2017 #120. Last seen 12/20/17 and next f/u 06/20/2018. Not receiving from other Md's. Routed to Dr. Epimenio Foot to escribe.

## 2018-01-30 NOTE — Telephone Encounter (Signed)
Pt is calling to refill her dextroamphetamine (DEXEDRINE) 10 MG 24 hr capsule pt states she needs the extended release called in at the [facility] on [address]

## 2018-02-07 ENCOUNTER — Other Ambulatory Visit: Payer: Self-pay | Admitting: *Deleted

## 2018-02-07 MED ORDER — FINGOLIMOD HCL 0.5 MG PO CAPS: ORAL_CAPSULE | ORAL | 3 refills | Status: DC

## 2018-02-18 DIAGNOSIS — M25561 Pain in right knee: Secondary | ICD-10-CM | POA: Diagnosis not present

## 2018-02-18 DIAGNOSIS — S8001XA Contusion of right knee, initial encounter: Secondary | ICD-10-CM | POA: Diagnosis not present

## 2018-02-18 DIAGNOSIS — M79671 Pain in right foot: Secondary | ICD-10-CM | POA: Diagnosis not present

## 2018-02-18 DIAGNOSIS — S93601A Unspecified sprain of right foot, initial encounter: Secondary | ICD-10-CM | POA: Diagnosis not present

## 2018-02-18 DIAGNOSIS — S8991XA Unspecified injury of right lower leg, initial encounter: Secondary | ICD-10-CM | POA: Diagnosis not present

## 2018-02-18 DIAGNOSIS — S99921A Unspecified injury of right foot, initial encounter: Secondary | ICD-10-CM | POA: Diagnosis not present

## 2018-02-28 ENCOUNTER — Other Ambulatory Visit: Payer: Self-pay | Admitting: Neurology

## 2018-02-28 MED ORDER — DEXTROAMPHETAMINE SULFATE ER 10 MG PO CP24: 20.0000 mg | ORAL_CAPSULE | Freq: Every day | ORAL | 0 refills | Status: DC

## 2018-02-28 NOTE — Telephone Encounter (Signed)
Patient calling to request refill of dextroamphetamine (DEXEDRINE) 10 MG 24 hr capsule to be sent to [facility] on [address].

## 2018-02-28 NOTE — Addendum Note (Signed)
Addended by: Hillis Range on: 02/28/2018 12:56 PM   Modules accepted: Orders

## 2018-03-28 ENCOUNTER — Other Ambulatory Visit: Payer: Self-pay | Admitting: Neurology

## 2018-03-28 DIAGNOSIS — R52 Pain, unspecified: Secondary | ICD-10-CM | POA: Diagnosis not present

## 2018-03-28 DIAGNOSIS — B9789 Other viral agents as the cause of diseases classified elsewhere: Secondary | ICD-10-CM | POA: Diagnosis not present

## 2018-03-28 DIAGNOSIS — J069 Acute upper respiratory infection, unspecified: Secondary | ICD-10-CM | POA: Diagnosis not present

## 2018-03-28 MED ORDER — DEXTROAMPHETAMINE SULFATE ER 10 MG PO CP24: 20.0000 mg | ORAL_CAPSULE | Freq: Every day | ORAL | 0 refills | Status: DC

## 2018-03-28 NOTE — Telephone Encounter (Signed)
Patient is calling in requesting a refill on Dextroamphetamine Sulfate to be sent to the    [facility] #[id number] [facility], [place] - [address] AT [facility] [address] [phone] (Phone) [phone] (Fax)

## 2018-04-09 ENCOUNTER — Telehealth: Payer: Self-pay | Admitting: Neurology

## 2018-04-09 MED ORDER — METHOCARBAMOL 500 MG PO TABS: 500.0000 mg | ORAL_TABLET | Freq: Three times a day (TID) | ORAL | 0 refills | Status: DC

## 2018-04-09 NOTE — Telephone Encounter (Signed)
I called pt. Relayed Dr. Bonnita Hollow recommendation. She is agreeable to plan. She would like rx to go to: [facility] #[id number] - [place], [place] - [address] AT [facility] [address]

## 2018-04-09 NOTE — Telephone Encounter (Signed)
She is on meloxicam --- we can add methocarbamol 500 mg po tid (muscle relaxant)

## 2018-04-09 NOTE — Telephone Encounter (Signed)
Pt is asking for a call to discuss a constant back pain she has been experiencing.

## 2018-04-09 NOTE — Telephone Encounter (Signed)
Called pt back. She has been having back pain for the past few days. Pain located in the middle back on the right side. Has tried tylenol but ineffective. She fell about two weeks ago. She saw PCP/urgent after this and they gave her a cream for her knee pain. She tried cream on her back but ineffective. I advised Dr Epimenio Foot out of office today. Instructed her to f/u with PCP about back pain. I will send message to Dr. Epimenio Foot to see what he would recommend. Asked her to call if PCP prescribes anything. She was requesting pain medication.

## 2018-04-09 NOTE — Addendum Note (Signed)
Addended by: Hillis Range on: 04/09/2018 03:37 PM   Modules accepted: Orders

## 2018-04-30 ENCOUNTER — Other Ambulatory Visit: Payer: Self-pay | Admitting: Neurology

## 2018-04-30 MED ORDER — DEXTROAMPHETAMINE SULFATE ER 10 MG PO CP24: 20.0000 mg | ORAL_CAPSULE | Freq: Every day | ORAL | 0 refills | Status: DC

## 2018-04-30 NOTE — Telephone Encounter (Signed)
Pt called in for a refill of dextroamphetamine (DEXEDRINE) 10 MG 24 hr capsule sent to [facility] #[id number] - [place], [place] - [address] AT [facility] [address]

## 2018-05-02 ENCOUNTER — Telehealth: Payer: Self-pay | Admitting: Neurology

## 2018-05-02 NOTE — Telephone Encounter (Signed)
Pt is asking that the pre authorization for her Fingolimod HCl (GILENYA) 0.5 MG CAPS be done.  Pt states her current will run out on 05-25-2018

## 2018-05-03 NOTE — Telephone Encounter (Signed)
I called FEP at [phone] and spoke with Tammy. Pt ID: [id number]. She will fax PA form to complete for Gilenya to fax: [phone]. Should receive fax in the next couple hours per Tammy to complete. Can take up to 72 hr for determination once faxed back in

## 2018-05-03 NOTE — Telephone Encounter (Signed)
Faxed completed PA form back at [phone]. Advised this was a proactive request since current PA on file will expire on 05/30/18. Pt has been on Gilenya since 2013. Tried/failed: Copaxone. Waiting on determination.

## 2018-05-07 NOTE — Telephone Encounter (Signed)
Received fax notification from [facility] FEP that PA approved 04/03/18-05/03/19

## 2018-05-31 ENCOUNTER — Other Ambulatory Visit: Payer: Self-pay | Admitting: Neurology

## 2018-05-31 MED ORDER — DEXTROAMPHETAMINE SULFATE ER 10 MG PO CP24: 20.0000 mg | ORAL_CAPSULE | Freq: Every day | ORAL | 0 refills | Status: DC

## 2018-05-31 NOTE — Telephone Encounter (Signed)
Pt is needing a refill on her dextroamphetamine (DEXEDRINE) 10 MG 24 hr capsule and sent to [facility] on [address]

## 2018-06-19 ENCOUNTER — Telehealth: Payer: Self-pay | Admitting: *Deleted

## 2018-06-19 NOTE — Telephone Encounter (Signed)
Called pt. Updated medication list, pharmacy, allergies for VV tomorrow. I re-sent email for VV at [email or web address]. Confirmed she received email. Explained process for [email or web address].

## 2018-06-20 ENCOUNTER — Ambulatory Visit (INDEPENDENT_AMBULATORY_CARE_PROVIDER_SITE_OTHER): Payer: Federal, State, Local not specified - PPO | Admitting: Neurology

## 2018-06-20 ENCOUNTER — Other Ambulatory Visit: Payer: Self-pay

## 2018-06-20 ENCOUNTER — Encounter: Payer: Self-pay | Admitting: Neurology

## 2018-06-20 DIAGNOSIS — R5383 Other fatigue: Secondary | ICD-10-CM

## 2018-06-20 DIAGNOSIS — G35 Multiple sclerosis: Secondary | ICD-10-CM | POA: Diagnosis not present

## 2018-06-20 DIAGNOSIS — R208 Other disturbances of skin sensation: Secondary | ICD-10-CM

## 2018-06-20 DIAGNOSIS — Z79899 Other long term (current) drug therapy: Secondary | ICD-10-CM

## 2018-06-20 DIAGNOSIS — R269 Unspecified abnormalities of gait and mobility: Secondary | ICD-10-CM | POA: Diagnosis not present

## 2018-06-20 DIAGNOSIS — F988 Other specified behavioral and emotional disorders with onset usually occurring in childhood and adolescence: Secondary | ICD-10-CM

## 2018-06-20 DIAGNOSIS — M25561 Pain in right knee: Secondary | ICD-10-CM

## 2018-06-20 MED ORDER — ETODOLAC 400 MG PO TABS: 400.0000 mg | ORAL_TABLET | Freq: Two times a day (BID) | ORAL | 5 refills | Status: DC

## 2018-06-20 MED ORDER — DEXTROAMPHETAMINE SULFATE ER 10 MG PO CP24: 20.0000 mg | ORAL_CAPSULE | Freq: Every day | ORAL | 0 refills | Status: DC

## 2018-06-20 MED ORDER — TRIAMCINOLONE ACETONIDE 0.1 % EX CREA: TOPICAL_CREAM | Freq: Three times a day (TID) | CUTANEOUS | 1 refills | Status: DC

## 2018-06-20 NOTE — Progress Notes (Signed)
[facility]  PATIENT: Cathy [name] DOB: 1965/07/27  REFERRING DOCTOR OR PCP:  Synetta Fail  SOURCE: patient and medical records available  _________________________________   HISTORICAL  CHIEF COMPLAINT:  Chief Complaint  Patient presents with  . Multiple Sclerosis    On Gilenya    HISTORY OF PRESENT ILLNESS:  Cathy [name] is a 53 y.o. woman with MS noting more gait issues.  Update 06/20/2018: Virtual Visit via Video Note I connected with Kandas Crysler on 06/20/18 at 11:30 AM EDT by a video enabled telemedicine application and verified that I am speaking with the correct person.  I discussed the limitations of evaluation and management by telemedicine and the availability of in person appointments. The patient expressed understanding and agreed to proceed.  History of Present Illness: She feels her MS is stable.  She is on Gilenya and she tolerates it well.  We did have a discussion about MS and Gilenya going the COVID-19 pandemic.  The MS itself is probably just a slight risk.  However, Gilenya has been associated with some infections and it is possible that it can lead to a higher risk of COVID-19.  She is socially distancing.  She denies any new MS symptoms or exacerbations.  She continues to have mild issues with her gait with some reduced balance.  Sometimes the right leg gives out.  She notes mildly reduced vision OD since her optic neuritis.  It is stable.   Bladder function is good.     Fatigue is better on Dexedrine 40 mg qAM.  She has some insomnia.  Melatonin 5 mg only helps some.  She denies any difficulties with mood.  She is still having pain in the right knee since she fell early March 2020.   She was on meloxicam but it had not helped much.   She could not tolerate meloxicam.  Pain is worse after she walks more and when she gets out of bed.   Her knee was swollen at first.   She denies weakness or numbness.        Observations/Objective:   She is a well-developed well-nourished woman in no acute distress.  The head is normocephalic and atraumatic.  Sclera are anicteric.  Visible skin appears normal.  The neck has a good range of motion.  Pharynx and tongue have normal appearance.  She is alert and fully oriented with fluent speech and good attention, knowledge and memory.  Extraocular muscles are intact.  Facial strength is normal.  Palatal elevation and tongue protrusion are midline.  She appears to have normal strength in the arms.  Rapid alternating movements and finger-nose-finger are performed well.  Assessment and Plan: Multiple sclerosis (HCC) - Plan: MR BRAIN W WO CONTRAST, MR CERVICAL SPINE W WO CONTRAST  Gait disturbance  Attention deficit disorder, unspecified hyperactivity presence  Other fatigue  High risk medication use  Right knee pain, unspecified chronicity - Plan: Ambulatory referral to Orthopedic Surgery  Dysesthesia   1.   Continue Gilenya.  We will check an MRI of the brain and cervical spine to determine if there is any subclinical progression.  If present, consider a switch to a different disease modifying therapy. 2.   She has right knee pain that has persisted that occurred after fall in March.  Due to continued symptoms I will refer her to orthopedics.  Etodolac for pain 3.   Renew Dexedrine for MS-related fatigue 4.   Return in 6 months or sooner if there are  new or worsening neurologic symptoms.  Follow Up Instructions: I discussed the assessment and treatment plan with the patient. The patient was provided an opportunity to ask questions and all were answered. The patient agreed with the plan and demonstrated an understanding of the instructions.    The patient was advised to call back or seek an in-person evaluation if the symptoms worsen or if the condition fails to improve as anticipated.  I provided 25 minutes of non-face-to-face time during this encounter.   _______________________________ From previous visits Update 12/20/2017: She is on Gilenya as her DMT and tolerates it well.   No definite exacerbations,.   However, she notes that the right eye vision seems worse.   She notes this especially in dim light.     She had optic neuritis in the past on the right and does note reduced contrast and color vision during the day which is chronic.    Her gait is doing well   She denies any falls.   Her right leg seems to limp more.   Gait is worse when tired or hot.   She has some RLS symptoms in the right leg and it seems to cramp up some.   Moving helps. She has some insomnia due to the leg discomfort.   Gabapentin had a hangover in the past.   She had not tried baclofen or Ropinirole in the past.   Bladder is doing well.  She notes fatigue and some reduced focus/attnetion.     Dexedrine helps her fatigue and attnetion.  However, she still finds learning to be difficult.    Mood is doing well.     Update 05/30/2017: She feels that she has been fairly stable.  She notes no exacerbations or other new MS symptoms.  She is on Gilenya and she tolerates it well.  Her gait is baseline with mildly reduced balance.  She has occasional stumbling but no falls.  She does not note any major problems with weakness or numbness.  She has some urinary urgency and frequency and rare incontinence.  She notes occasioanl constipation and has also had runny stools with 2 episodes of fecal incontinence.   Vision is fine.  Continues to report some fatigue that is both physical and cognitive.  She does better with Dexedrine and she tolerates it well.  She also has had some difficulty with focus and attention and that is also improved by the Dexedrine.   She denies any problems with mood.   She has some insomnia but notes that if she drinks a beer at bedtime she falls asleep better.       She notes sharp pains in her temple lasting 1 second at times.    Sometimes she notes pain in her back  when she takes a deep breath.  Update 11/29/2016:   She feels her MS is stable.   No new numbness, weakness or gait issues.   Balance is still a little off but no falls.    Occasionally, one of her knees will give out but she does not fall.    Bladder is doing well with mild frequency.   Vision is doing ok.     She feels her fatigue is less of a problem on dextroamphetamine.    It also helps her focus and attention.    She denies depression.    She has insomnia (sleep maintenance > sleep onset).    She takes melatonin.  She sometimes feels that she wakes  up but can't move.  She does not have much daytime sleepiness.     She occasionally gets sharp pain in her temple lasting seconds (not sure if left or right)..  It occurs less than once a week.   From 01/06/2016:    MS:   She has been on Gilenya since 2013 and is tolerating it well.  She denies any new numbness or weakness in the right arm or either leg.   MRI brain 01/22/2015 showed no acute findings.      Gait/strength/sensation:   Her right leg is dragging a more and it is more noticeable.   She stumbles and has had a few falls.  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    She gets a [employer] sensation in both legs.  Bladder:  She is doing well with minimal frequency.  She has nocturia once a night  Vision:   She feels this is stable.  However,  she had right optic neuritis in the past and still notes right color desaturation and slightly reduced visual acuity.   No eye pain or diplopia.     Mood/cognition:    She denies any depression or anxiety. She notes cognitive issues, especially attention deficit, mild problems with verbal fluency and processing speed. She notes that cognitive problems have improved on the Dexedrine.has   Fatigue/sleep: She notes fatigue,  worse as the day goes on and with heat. She has a lot of benefit with Dexedrine 20 mg twice a day.   Sometimes, however, she  gets much more tired in the evenings. She falls aslee well at night now but had insomnia last year.   Shoulder:    Her shoulder is much better since the shoulder injection at the last visit.    MS History:   She was diagnosed with MS after presenting with optic neuritis x 2 in 2009.   She had an MRI and LP and was diagnosed with MS.     She started on Copaxone and switched to Gilenya in 2013.    She denies any further exacerbation since the optic neuritis but was told she had MS plaques on her spine when she presented last year with a cervical radiculopathy requiring surgery.    I have seen her the past year at [facility].   Her last MRI's were Jan 21, 2014.  MRI images show foci in the cerebellum and the hemispheres consistent with MS. There were no acute findings. She has NIDDM, HTN and high cholesterol.    REVIEW OF SYSTEMS: Constitutional: No fevers, chills, sweats, or change in appetite.   Has fatigue Eyes: H/o right ON with residual color desaturation Ear, nose and throat: No hearing loss, ear pain, nasal congestion, sore throat Cardiovascular: No chest pain, palpitations Respiratory: No shortness of breath at rest or with exertion.   No wheezes GastrointestinaI: No nausea, vomiting, diarrhea, abdominal pain, fecal incontinence Genitourinary: No dysuria, urinary retention or frequency.  No nocturia. Musculoskeletal: No neck pain, back pain,. Notes left shoulder pain. Integumentary: No rash, pruritus, skin lesions Neurological: as above Psychiatric: No depression at this time.  No anxiety Endocrine: has NIDDM.  No palpitations, diaphoresis, change in appetite, change in weigh or increased thirst Hematologic/Lymphatic: No anemia, purpura, petechiae. Allergic/Immunologic: No itchy/runny eyes, nasal congestion, recent allergic reactions, rashes  ALLERGIES: Allergies  Allergen Reactions  . Aspirin     Heartburn  . Ibuprofen  heatburn    HOME MEDICATIONS:   Current Outpatient Medications:  .  atorvastatin (LIPITOR) 80 MG tablet, Take 80 mg by mouth daily at 6 PM. , Disp: , Rfl: 11 .  Cholecalciferol (VITAMIN D) 2000 UNITS CAPS, Take 2,000 Units by mouth daily., Disp: , Rfl:  .  dextroamphetamine (DEXEDRINE) 10 MG 24 hr capsule, Take 2-4 capsules (20-40 mg total) by mouth daily., Disp: 120 capsule, Rfl: 0 .  etodolac (LODINE) 400 MG tablet, Take 1 tablet (400 mg total) by mouth 2 (two) times daily., Disp: 60 tablet, Rfl: 5 .  Fingolimod HCl (GILENYA) 0.5 MG CAPS, TAKE ONE CAPSULE (0.5 MG) BY MOUTH EACH DAY. MAY TAKE WITH OR WITHOUTFOOD. STORE AT ROOM TEMPERATURE., Disp: 90 capsule, Rfl: 3 .  loratadine (CLARITIN) 10 MG tablet, Take 10 mg by mouth daily., Disp: , Rfl:  .  losartan-hydrochlorothiazide (HYZAAR) 50-12.5 MG per tablet, Take 1 tablet by mouth daily. , Disp: , Rfl:  .  Omega 3-5-6-7-9 Fatty Acids (COMPLETE OMEGA PO), Take by mouth., Disp: , Rfl:  .  SitaGLIPtin-MetFORMIN HCl (JANUMET XR) [phone] MG TB24, Take by mouth., Disp: , Rfl:  .  triamcinolone cream (KENALOG) 0.1 %, Apply topically 3 (three) times daily., Disp: 45 g, Rfl: 1 No current facility-administered medications for this visit.   Facility-Administered Medications Ordered in Other Visits:  .  gadopentetate dimeglumine (MAGNEVIST) injection 20 mL, 20 mL, Intravenous, Once PRN, , Pearletha Furlichard A, MD  PAST MEDICAL HISTORY: Past Medical History:  Diagnosis Date  . Diabetes mellitus without complication (HCC)   . Eczema   . Hypertension   . Multiple sclerosis (HCC)   . Vision abnormalities     PAST SURGICAL HISTORY: Past Surgical History:  Procedure Laterality Date  . ABDOMINAL HYSTERECTOMY    . CARPAL TUNNEL RELEASE    . CERVICAL SPINE SURGERY     Dr. Sharolyn DouglasMax Cohen  . LASER ABLATION    . TUBAL LIGATION      FAMILY HISTORY: Family History  Problem Relation Age of Onset  . Hypertension Mother   . Diabetes type II Mother   . COPD Father   . Multiple sclerosis  Paternal Uncle   . Multiple sclerosis Paternal Grandfather     SOCIAL HISTORY:  Social History   Socioeconomic History  . Marital status: Married    Spouse name: Anothony  . Number of children: 3  . Years of education: 12+  . Highest education level: Not on file  Occupational History  . Not on file  Social Needs  . Financial resource strain: Not on file  . Food insecurity:    Worry: Not on file    Inability: Not on file  . Transportation needs:    Medical: Not on file    Non-medical: Not on file  Tobacco Use  . Smoking status: Never Smoker  . Smokeless tobacco: Never Used  Substance and Sexual Activity  . Alcohol use: Yes    Comment: occasionally  . Drug use: No  . Sexual activity: Yes    Birth control/protection: Surgical  Lifestyle  . Physical activity:    Days per week: Not on file    Minutes per session: Not on file  . Stress: Not on file  Relationships  . Social connections:    Talks on phone: Not on file    Gets together: Not on file    Attends religious service: Not on file    Active member of club or organization: Not on file  Attends meetings of clubs or organizations: Not on file    Relationship status: Not on file  . Intimate partner violence:    Fear of current or ex partner: Not on file    Emotionally abused: Not on file    Physically abused: Not on file    Forced sexual activity: Not on file  Other Topics Concern  . Not on file  Social History Narrative   Right handed    Caffeine HQP:RFFM sometimes.   Lives with husband,  Ethelene Browns     PHYSICAL EXAM  There were no vitals filed for this visit.  There is no height or weight on file to calculate BMI.   General: The patient is well-developed and well-nourished and in no acute distress   Neurologic Exam  Mental status: The patient is alert and oriented x 3 at the time of the examination. The patient has apparent normal recent and remote memory, with an apparently normal attention span  and concentration ability.   Speech is normal.  Cranial nerves: Extraocular movements are full.  Facial strength and sensation was normal.  Trapezius strength is normal.     No obvious hearing deficits are noted.  Motor:  Muscle bulk is normal.   There is mildly increased muscle tone in the right leg.  Strength was 5/5.  Sensory: Sensory testing is intact to soft touch and vibration sensation in all 4 extremities.  Coordination: Cerebellar testing reveals good finger-nose-finger and reduced right heel to shin.  Gait and station: Station is normal.   The gait is near normal but tandem gait is wide. . Romberg is negative.   Reflexes: Deep tendon reflexes are symmetric and normal bilaterally.           A. Epimenio Foot, MD, PhD 06/20/2018, 5:19 PM Certified in Neurology, Clinical Neurophysiology, Sleep Medicine, Pain Medicine and Neuroimaging  [facility] [address] [place], [place] [address] [phone] -

## 2018-06-26 ENCOUNTER — Telehealth: Payer: Self-pay | Admitting: Neurology

## 2018-06-26 NOTE — Telephone Encounter (Signed)
no to the covid-19 quetsions MR Brain w/wo contrast & MR Cervical spine w/wo contrast Dr. Gershon Mussel Fed Auth: NPR Ref # [id number]. Patient is scheduled at [facility] for 06/27/18.

## 2018-06-27 ENCOUNTER — Ambulatory Visit: Payer: Federal, State, Local not specified - PPO

## 2018-06-27 ENCOUNTER — Encounter: Payer: Self-pay | Admitting: Neurology

## 2018-06-27 ENCOUNTER — Other Ambulatory Visit: Payer: Self-pay

## 2018-06-27 ENCOUNTER — Telehealth: Payer: Self-pay | Admitting: *Deleted

## 2018-06-27 DIAGNOSIS — G35 Multiple sclerosis: Secondary | ICD-10-CM

## 2018-06-27 MED ORDER — GADOBENATE DIMEGLUMINE 529 MG/ML IV SOLN
20.0000 mL | Freq: Once | INTRAVENOUS | Status: AC | PRN
  Administered 2018-06-27: 20 mL via INTRAVENOUS

## 2018-06-27 NOTE — Telephone Encounter (Signed)
Called pt. Relayed results per Dr. Epimenio Foot note. She verbalized understanding.

## 2018-06-27 NOTE — Telephone Encounter (Signed)
-----   Message from Asa Lente, MD sent at 06/27/2018  2:28 PM EDT ----- Please let the patient know that the MRIs are unchanged .   No new MS lesions.   She has some cervical degenerative changes but they are the same as previous MRI 2 years ago.  No nerve root compression

## 2018-07-02 ENCOUNTER — Other Ambulatory Visit: Payer: Self-pay | Admitting: Neurology

## 2018-07-02 MED ORDER — DEXTROAMPHETAMINE SULFATE ER 10 MG PO CP24: 20.0000 mg | ORAL_CAPSULE | Freq: Every day | ORAL | 0 refills | Status: DC

## 2018-07-02 NOTE — Telephone Encounter (Signed)
Pt has called for a refill on her dextroamphetamine (DEXEDRINE) 10 MG 24 hr capsule [facility] #[id number] - 

## 2018-07-02 NOTE — Telephone Encounter (Signed)
Checked drug registry. She last refilled rx 05/31/18 #120. Last seen 06/20/18. Has no f/u scheduled yet. Dr. Felecia Shelling wanted her back in 6 months. I called pt and scheduled one for 12/24/18 at 8:30am with Dr. Felecia Shelling.

## 2018-07-02 NOTE — Addendum Note (Signed)
Addended by: Hope Pigeon on: 07/02/2018 09:59 AM   Modules accepted: Orders

## 2018-07-04 ENCOUNTER — Telehealth: Payer: Self-pay | Admitting: Neurology

## 2018-07-04 NOTE — Telephone Encounter (Signed)
Called pt. Advised PA approved. She verbalized understanding and appreciation.

## 2018-07-04 NOTE — Telephone Encounter (Signed)
Called [facility] at [phone] and spoke with Lela. Provided clinical info for PA. PA approved 06/04/18-07/04/19. We will receive fax about approval.

## 2018-07-04 NOTE — Telephone Encounter (Signed)
Pt called stating that her pharmacy [facility] [address] in [place] informed her that she is needing a PA for her dextroamphetamine (DEXEDRINE) 10 MG 24 hr capsule  Please advise.

## 2018-07-09 DIAGNOSIS — M1711 Unilateral primary osteoarthritis, right knee: Secondary | ICD-10-CM | POA: Diagnosis not present

## 2018-07-09 DIAGNOSIS — M67432 Ganglion, left wrist: Secondary | ICD-10-CM | POA: Diagnosis not present

## 2018-07-15 DIAGNOSIS — M1711 Unilateral primary osteoarthritis, right knee: Secondary | ICD-10-CM | POA: Insufficient documentation

## 2018-08-01 ENCOUNTER — Other Ambulatory Visit: Payer: Self-pay | Admitting: Neurology

## 2018-08-01 MED ORDER — DEXTROAMPHETAMINE SULFATE ER 10 MG PO CP24: 20.0000 mg | ORAL_CAPSULE | Freq: Every day | ORAL | 0 refills | Status: DC

## 2018-08-01 NOTE — Telephone Encounter (Signed)
Pt is needing a refill on her dextroamphetamine (DEXEDRINE) 10 MG 24 hr capsule sent to [facility] on [address]

## 2018-08-23 ENCOUNTER — Telehealth: Payer: Self-pay | Admitting: Neurology

## 2018-08-23 DIAGNOSIS — G35 Multiple sclerosis: Secondary | ICD-10-CM

## 2018-08-23 DIAGNOSIS — R269 Unspecified abnormalities of gait and mobility: Secondary | ICD-10-CM

## 2018-08-23 DIAGNOSIS — R5383 Other fatigue: Secondary | ICD-10-CM

## 2018-08-23 NOTE — Telephone Encounter (Signed)
Pt called in and stated she is ready to start her therapy

## 2018-08-23 NOTE — Telephone Encounter (Signed)
Pt has called to inform she would like to use [facility] [phone]

## 2018-08-23 NOTE — Telephone Encounter (Signed)
Called pt to get a little more information from her. Dr. Felecia Shelling offered speech therapy and physical therapy but she declined when he offered. She is ready to start this now. Wants to go somewhere is [place], [place]. She is going to contact her insurance to see who is in network in that area and call back to let me know.

## 2018-08-23 NOTE — Addendum Note (Signed)
Addended by: Hope Pigeon on: 08/23/2018 03:20 PM   Modules accepted: Orders

## 2018-08-23 NOTE — Telephone Encounter (Signed)
Placed PT/ST referrals as requested, ok per Dr. Felecia Shelling.

## 2018-08-28 NOTE — Telephone Encounter (Signed)
Pt has been scheduled at [facility] for 08/31/2018 @ 2pm

## 2018-08-30 DIAGNOSIS — M6281 Muscle weakness (generalized): Secondary | ICD-10-CM | POA: Diagnosis not present

## 2018-08-30 DIAGNOSIS — R262 Difficulty in walking, not elsewhere classified: Secondary | ICD-10-CM | POA: Diagnosis not present

## 2018-08-30 DIAGNOSIS — R2681 Unsteadiness on feet: Secondary | ICD-10-CM | POA: Diagnosis not present

## 2018-08-30 DIAGNOSIS — R2689 Other abnormalities of gait and mobility: Secondary | ICD-10-CM | POA: Diagnosis not present

## 2018-08-31 ENCOUNTER — Telehealth: Payer: Self-pay | Admitting: Neurology

## 2018-08-31 MED ORDER — DEXTROAMPHETAMINE SULFATE ER 10 MG PO CP24: 20.0000 mg | ORAL_CAPSULE | Freq: Every day | ORAL | 0 refills | Status: DC

## 2018-08-31 NOTE — Telephone Encounter (Signed)
The prescription for Dexedrine will be called in.

## 2018-08-31 NOTE — Telephone Encounter (Signed)
Pt has called for a refill on her dextroamphetamine (DEXEDRINE) 10 MG 24 hr capsule  [facility] [phone] - Pt was told the office is not open on Fridays, she states she will run out before Monday.  Pt was told the request would be made to the on call Dr

## 2018-08-31 NOTE — Addendum Note (Signed)
Addended by: Kathrynn Ducking on: 08/31/2018 09:10 AM   Modules accepted: Orders

## 2018-09-03 DIAGNOSIS — M6281 Muscle weakness (generalized): Secondary | ICD-10-CM | POA: Diagnosis not present

## 2018-09-03 DIAGNOSIS — R2681 Unsteadiness on feet: Secondary | ICD-10-CM | POA: Diagnosis not present

## 2018-09-03 DIAGNOSIS — R2689 Other abnormalities of gait and mobility: Secondary | ICD-10-CM | POA: Diagnosis not present

## 2018-09-03 DIAGNOSIS — R262 Difficulty in walking, not elsewhere classified: Secondary | ICD-10-CM | POA: Diagnosis not present

## 2018-09-06 DIAGNOSIS — R2681 Unsteadiness on feet: Secondary | ICD-10-CM | POA: Diagnosis not present

## 2018-09-06 DIAGNOSIS — M6281 Muscle weakness (generalized): Secondary | ICD-10-CM | POA: Diagnosis not present

## 2018-09-06 DIAGNOSIS — R2689 Other abnormalities of gait and mobility: Secondary | ICD-10-CM | POA: Diagnosis not present

## 2018-09-06 DIAGNOSIS — R262 Difficulty in walking, not elsewhere classified: Secondary | ICD-10-CM | POA: Diagnosis not present

## 2018-09-10 ENCOUNTER — Telehealth: Payer: Self-pay | Admitting: Neurology

## 2018-09-10 NOTE — Telephone Encounter (Signed)
I called pt back. Within the last week, numbness started. Intermittent throughout the day. Denies missing doses of Gilenya. Denies signs of infection, no fever. Last seen 06/20/18 and next f/u 12/24/18.   06/27/18 MRI's showed: "Please let the patient know that the MRIs are unchanged .   No new MS lesions.   She has some cervical degenerative changes but they are the same as previous MRI 2 years ago.  No nerve root compression"  Spoke with Dr. Felecia Shelling who would like to bring her in sooner for f/u. Scheduled f/u for 09/12/18 at 11:30am with Dr. Felecia Shelling. Asked she check in by 11am, wear mask, one visitor allowed.

## 2018-09-10 NOTE — Telephone Encounter (Signed)
Pt called in and stated she is feeling numbness in both her arms starting at her elbow and down to her hand

## 2018-09-11 DIAGNOSIS — R2689 Other abnormalities of gait and mobility: Secondary | ICD-10-CM | POA: Diagnosis not present

## 2018-09-11 DIAGNOSIS — R262 Difficulty in walking, not elsewhere classified: Secondary | ICD-10-CM | POA: Diagnosis not present

## 2018-09-11 DIAGNOSIS — R2681 Unsteadiness on feet: Secondary | ICD-10-CM | POA: Diagnosis not present

## 2018-09-11 DIAGNOSIS — M6281 Muscle weakness (generalized): Secondary | ICD-10-CM | POA: Diagnosis not present

## 2018-09-12 ENCOUNTER — Ambulatory Visit: Payer: Federal, State, Local not specified - PPO | Admitting: Neurology

## 2018-09-12 ENCOUNTER — Encounter: Payer: Self-pay | Admitting: Neurology

## 2018-09-12 ENCOUNTER — Other Ambulatory Visit: Payer: Self-pay

## 2018-09-12 VITALS — BP 151/89 | HR 100 | Temp 98.4°F | Ht 65.5 in | Wt 227.0 lb

## 2018-09-12 DIAGNOSIS — R269 Unspecified abnormalities of gait and mobility: Secondary | ICD-10-CM

## 2018-09-12 DIAGNOSIS — G35 Multiple sclerosis: Secondary | ICD-10-CM | POA: Diagnosis not present

## 2018-09-12 DIAGNOSIS — Z79899 Other long term (current) drug therapy: Secondary | ICD-10-CM

## 2018-09-12 DIAGNOSIS — F988 Other specified behavioral and emotional disorders with onset usually occurring in childhood and adolescence: Secondary | ICD-10-CM

## 2018-09-12 DIAGNOSIS — R208 Other disturbances of skin sensation: Secondary | ICD-10-CM | POA: Diagnosis not present

## 2018-09-12 MED ORDER — IMIPRAMINE HCL 25 MG PO TABS: 25.0000 mg | ORAL_TABLET | Freq: Every day | ORAL | 5 refills | Status: DC

## 2018-09-12 NOTE — Progress Notes (Signed)
[facility]  PATIENT: Cathy [name] DOB: 09/13/1965  REFERRING DOCTOR OR PCP:  Synetta Failaniel Jobe  SOURCE: patient and medical records available  _________________________________   HISTORICAL  CHIEF COMPLAINT:  Chief Complaint  Patient presents with  . Follow-up    RM 13, alone. Last seen 06/20/18. Here to be evaluated for daily intermittent numbness from elbows down to hands. Denies signs/sx of infection or missing any doses of Gilenya.     HISTORY OF PRESENT ILLNESS:  Cathy [name] is a 53 y.o. woman with MS noting more gait issues.  Update 09/12/18: She is on Gilenya and tolerates it well.    She has no definite exacerbation but has noted more tingling in her hands, uncomfortable but not painful at times.  Quality is pins/nedles like it fell asleep.   Symptoms are mostly in the forearm and dorsum of the wrist.    The episodes last just a couple minutes but they occur multiple times a day.   She does not note numbness.    She notes no change in coordination or strength.   She has older mild left arm and right leg weakness.    She notes no change in her bladder.  Vision is slightly worse but no major or asymmetric change.   She has not seen ophthalmology recently.  .  She notes fatigue, helped by dextroamphetamine.   She has insomnia due to difficulty falling asleep and sleep maintenance issues.   Mood is fine.   She has had some decreased focus/atention, helped by Dexedrine.   She needs to write things down some.     MRI cervical spine 06/27/18 showed "There are several T2 hyperintense foci within the spinal cord.  These are located posteriorly adjacent to C2-C3, laterally to the left adjacent to C2-C3, laterally to the right adjacent to C6 and posterolaterally to the right adjacent to C7.  The foci were present on the 01/29/2016 ".  [facility]  She has C4-C6 fusion.  MRI Brain 06/27/18:   "There are T2/flair hyperintense foci in the  hemispheres, left thalamus, left cerebellum and spinal cord in a pattern and configuration consistent with chronic demyelinating plaque associated with multiple sclerosis.  None of the foci appears to be acute.  Compared to the MRI dated 06/07/2017, there is no interim change."  Update 06/20/2018 (Virtual): She feels her MS is stable.  She is on Gilenya and she tolerates it well.  We did have a discussion about MS and Gilenya going the COVID-19 pandemic.  The MS itself is probably just a slight risk.  However, Gilenya has been associated with some infections and it is possible that it can lead to a higher risk of COVID-19.  She is socially distancing.  She denies any new MS symptoms or exacerbations.  She continues to have mild issues with her gait with some reduced balance.  Sometimes the right leg gives out.  She notes mildly reduced vision OD since her optic neuritis.  It is stable.   Bladder function is good.     Fatigue is better on Dexedrine 40 mg qAM.  She has some insomnia.  Melatonin 5 mg only helps some.  She denies any difficulties with mood.  She is still having pain in the right knee since she fell early March 2020.   She was on meloxicam but it had not helped much.   She could not tolerate meloxicam.  Pain is worse after she walks  more and when she gets out of bed.   Her knee was swollen at first.   She denies weakness or numbness.       1.   Continue Gilenya.  We will check an MRI of the brain and cervical spine to determine if there is any subclinical progression.  If present, consider a switch to a different disease modifying therapy. 2.   She has right knee pain that has persisted that occurred after fall in March.  Due to continued symptoms I will refer her to orthopedics.  Etodolac for pain 3.   Renew Dexedrine for MS-related fatigue 4.   Return in 6 months or sooner if there are new or worsening neurologic symptoms.   Update 12/20/2017: She is on Gilenya as her DMT and tolerates  it well.   No definite exacerbations,.   However, she notes that the right eye vision seems worse.   She notes this especially in dim light.     She had optic neuritis in the past on the right and does note reduced contrast and color vision during the day which is chronic.    Her gait is doing well   She denies any falls.   Her right leg seems to limp more.   Gait is worse when tired or hot.   She has some RLS symptoms in the right leg and it seems to cramp up some.   Moving helps. She has some insomnia due to the leg discomfort.   Gabapentin had a hangover in the past.   She had not tried baclofen or Ropinirole in the past.   Bladder is doing well.  She notes fatigue and some reduced focus/attnetion.     Dexedrine helps her fatigue and attnetion.  However, she still finds learning to be difficult.    Mood is doing well.     Update 05/30/2017: She feels that she has been fairly stable.  She notes no exacerbations or other new MS symptoms.  She is on Gilenya and she tolerates it well.  Her gait is baseline with mildly reduced balance.  She has occasional stumbling but no falls.  She does not note any major problems with weakness or numbness.  She has some urinary urgency and frequency and rare incontinence.  She notes occasioanl constipation and has also had runny stools with 2 episodes of fecal incontinence.   Vision is fine.  Continues to report some fatigue that is both physical and cognitive.  She does better with Dexedrine and she tolerates it well.  She also has had some difficulty with focus and attention and that is also improved by the Dexedrine.   She denies any problems with mood.   She has some insomnia but notes that if she drinks a beer at bedtime she falls asleep better.       She notes sharp pains in her temple lasting 1 second at times.    Sometimes she notes pain in her back when she takes a deep breath.  Update 11/29/2016:   She feels her MS is stable.   No new numbness, weakness or gait  issues.   Balance is still a little off but no falls.    Occasionally, one of her knees will give out but she does not fall.    Bladder is doing well with mild frequency.   Vision is doing ok.     She feels her fatigue is less of a problem on dextroamphetamine.    It  also helps her focus and attention.    She denies depression.    She has insomnia (sleep maintenance > sleep onset).    She takes melatonin.  She sometimes feels that she wakes up but can't move.  She does not have much daytime sleepiness.     She occasionally gets sharp pain in her temple lasting seconds (not sure if left or right)..  It occurs less than once a week.   From 01/06/2016:    MS:   She has been on Gilenya since 2013 and is tolerating it well.  She denies any new numbness or weakness in the right arm or either leg.   MRI brain 01/22/2015 showed no acute findings.      Gait/strength/sensation:   Her right leg is dragging a more and it is more noticeable.   She stumbles and has had a few falls.  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    She gets a [employer] sensation in both legs.  Bladder:  She is doing well with minimal frequency.  She has nocturia once a night  Vision:   She feels this is stable.  However,  she had right optic neuritis in the past and still notes right color desaturation and slightly reduced visual acuity.   No eye pain or diplopia.     Mood/cognition:    She denies any depression or anxiety. She notes cognitive issues, especially attention deficit, mild problems with verbal fluency and processing speed. She notes that cognitive problems have improved on the Dexedrine.has   Fatigue/sleep: She notes fatigue,  worse as the day goes on and with heat. She has a lot of benefit with Dexedrine 20 mg twice a day.   Sometimes, however, she gets much more tired in the evenings. She falls aslee well at night now but had insomnia last year.   Shoulder:     Her shoulder is much better since the shoulder injection at the last visit.    MS History:   She was diagnosed with MS after presenting with optic neuritis x 2 in 2009.   She had an MRI and LP and was diagnosed with MS.     She started on Copaxone and switched to Gilenya in 2013.    She denies any further exacerbation since the optic neuritis but was told she had MS plaques on her spine when she presented last year with a cervical radiculopathy requiring surgery.    [facility]   Her last MRI's were Jan 21, 2014.  MRI images show foci in the cerebellum and the hemispheres consistent with MS. There were no acute findings. She has NIDDM, HTN and high cholesterol.    REVIEW OF SYSTEMS: Constitutional: No fevers, chills, sweats, or change in appetite.   Has fatigue Eyes: H/o right ON with residual color desaturation Ear, nose and throat: No hearing loss, ear pain, nasal congestion, sore throat Cardiovascular: No chest pain, palpitations Respiratory: No shortness of breath at rest or with exertion.   No wheezes GastrointestinaI: No nausea, vomiting, diarrhea, abdominal pain, fecal incontinence Genitourinary: No dysuria, urinary retention or frequency.  No nocturia. Musculoskeletal: No neck pain, back pain,. Notes left shoulder pain. Integumentary: No rash, pruritus, skin lesions Neurological: as above Psychiatric: No depression at this time.  No anxiety Endocrine: has NIDDM.  No palpitations, diaphoresis, change in appetite, change in weigh or increased  thirst Hematologic/Lymphatic: No anemia, purpura, petechiae. Allergic/Immunologic: No itchy/runny eyes, nasal congestion, recent allergic reactions, rashes  ALLERGIES: Allergies  Allergen Reactions  . Aspirin     Heartburn  . Ibuprofen     heatburn    HOME MEDICATIONS:  Current Outpatient Medications:  .  atorvastatin (LIPITOR) 80 MG tablet, Take 80 mg by mouth daily at 6 PM. , Disp: , Rfl:  11 .  Cholecalciferol (VITAMIN D) 2000 UNITS CAPS, Take 2,000 Units by mouth daily., Disp: , Rfl:  .  dextroamphetamine (DEXEDRINE) 10 MG 24 hr capsule, Take 2-4 capsules (20-40 mg total) by mouth daily., Disp: 120 capsule, Rfl: 0 .  Fingolimod HCl (GILENYA) 0.5 MG CAPS, TAKE ONE CAPSULE (0.5 MG) BY MOUTH EACH DAY. MAY TAKE WITH OR WITHOUTFOOD. STORE AT ROOM TEMPERATURE., Disp: 90 capsule, Rfl: 3 .  loratadine (CLARITIN) 10 MG tablet, Take 10 mg by mouth daily., Disp: , Rfl:  .  losartan-hydrochlorothiazide (HYZAAR) 50-12.5 MG per tablet, Take 1 tablet by mouth daily. , Disp: , Rfl:  .  SitaGLIPtin-MetFORMIN HCl (JANUMET XR) [phone] MG TB24, Take by mouth., Disp: , Rfl:  .  triamcinolone cream (KENALOG) 0.1 %, Apply topically 3 (three) times daily., Disp: 45 g, Rfl: 1 No current facility-administered medications for this visit.   Facility-Administered Medications Ordered in Other Visits:  .  gadopentetate dimeglumine (MAGNEVIST) injection 20 mL, 20 mL, Intravenous, Once PRN, Ronit Marczak, Nanine Means, MD  PAST MEDICAL HISTORY: Past Medical History:  Diagnosis Date  . Diabetes mellitus without complication ([place])   . Eczema   . Hypertension   . Multiple sclerosis ([place])   . Vision abnormalities     PAST SURGICAL HISTORY: Past Surgical History:  Procedure Laterality Date  . ABDOMINAL HYSTERECTOMY    . CARPAL TUNNEL RELEASE    . CERVICAL SPINE SURGERY     Dr. Rennis Harding  . LASER ABLATION    . TUBAL LIGATION      FAMILY HISTORY: Family History  Problem Relation Age of Onset  . Hypertension Mother   . Diabetes type II Mother   . COPD Father   . Multiple sclerosis Paternal Uncle   . Multiple sclerosis Paternal Grandfather     SOCIAL HISTORY:  Social History   Socioeconomic History  . Marital status: Married    Spouse name: Anothony  . Number of children: 3  . Years of education: 12+  . Highest education level: Not on file  Occupational History  . Not on file  Social Needs  .  Financial resource strain: Not on file  . Food insecurity    Worry: Not on file    Inability: Not on file  . Transportation needs    Medical: Not on file    Non-medical: Not on file  Tobacco Use  . Smoking status: Never Smoker  . Smokeless tobacco: Never Used  Substance and Sexual Activity  . Alcohol use: Yes    Comment: occasionally  . Drug use: No  . Sexual activity: Yes    Birth control/protection: Surgical  Lifestyle  . Physical activity    Days per week: Not on file    Minutes per session: Not on file  . Stress: Not on file  Relationships  . Social Herbalist on phone: Not on file    Gets together: Not on file    Attends religious service: Not on file    Active member of club or organization: Not on file    Attends meetings  of clubs or organizations: Not on file    Relationship status: Not on file  . Intimate partner violence    Fear of current or ex partner: Not on file    Emotionally abused: Not on file    Physically abused: Not on file    Forced sexual activity: Not on file  Other Topics Concern  . Not on file  Social History Narrative   Right handed    Caffeine MCN:OBSJ sometimes.   Lives with husband,  Ethelene Browns     PHYSICAL EXAM  Vitals:   09/12/18 1126  BP: (!) 151/89  Pulse: 100  Temp: 98.4 F (36.9 C)  Weight: 227 lb (103 kg)  Height: 5' 5.5" (1.664 m)    Body mass index is 37.2 kg/m.   General: The patient is well-developed and well-nourished and in no acute distress   Neurologic Exam  Mental status: The patient is alert and oriented x 3 at the time of the examination. The patient has apparent normal recent and remote memory, with an apparently normal attention span and concentration ability.   Speech is normal.  Cranial nerves: Extraocular movements are full.  Facial strength and sensation was normal.  Trapezius strength is normal.     No obvious hearing deficits are noted.  Motor:  Muscle bulk is normal.   Muscle tone is  mildly increased in the right leg.  Strength is 5/5. Sensory: Sensory testing is intact to soft touch and vibration sensation in all 4 extremities.  Coordination: Cerebellar testing reveals good finger-nose-finger and reduced right heel to shin.  Gait and station: Station is normal.   The gait is fairly normal but the tandem gait is wide.  Romberg is negative.  Reflexes: Deep tendon reflexes are symmetric and normal bilaterally.       1. Multiple sclerosis (HCC)   2. High risk medication use   3. Gait disturbance   4. Dysesthesia   5. Attention deficit disorder, unspecified hyperactivity presence     1.   Continue Gilenya for MS.  We will check blood work today.  MRIs did not show any new lesions. 2.   Continue Dexedrine for fatigue.  This may also help weight. 3.   Imipramine for dysesthesias.   4.   Stay active and exercise as tolerated. 5.   Return in 6 months or sooner if there are new or worsening neurologic symptoms.   Desirea Mizrahi A. Epimenio Foot, MD, PhD 09/12/2018, 11:37 AM Certified in Neurology, Clinical Neurophysiology, Sleep Medicine, Pain Medicine and Neuroimaging  [facility] [address], [address] [place], [place] [address] [phone] -

## 2018-09-13 ENCOUNTER — Telehealth: Payer: Self-pay | Admitting: *Deleted

## 2018-09-13 LAB — CBC WITH DIFFERENTIAL/PLATELET
Basophils Absolute: 0 10*3/uL (ref 0.0–0.2)
Basos: 1 %
EOS (ABSOLUTE): 0 10*3/uL (ref 0.0–0.4)
Eos: 1 %
Hematocrit: 35.5 % (ref 34.0–46.6)
Hemoglobin: 12.2 g/dL (ref 11.1–15.9)
Immature Grans (Abs): 0 10*3/uL (ref 0.0–0.1)
Immature Granulocytes: 0 %
Lymphocytes Absolute: 0.6 10*3/uL — ABNORMAL LOW (ref 0.7–3.1)
Lymphs: 12 %
MCH: 29.5 pg (ref 26.6–33.0)
MCHC: 34.4 g/dL (ref 31.5–35.7)
MCV: 86 fL (ref 79–97)
Monocytes Absolute: 0.6 10*3/uL (ref 0.1–0.9)
Monocytes: 11 %
Neutrophils Absolute: 3.9 10*3/uL (ref 1.4–7.0)
Neutrophils: 75 %
Platelets: 331 10*3/uL (ref 150–450)
RBC: 4.14 x10E6/uL (ref 3.77–5.28)
RDW: 12.9 % (ref 11.7–15.4)
WBC: 5.2 10*3/uL (ref 3.4–10.8)

## 2018-09-13 LAB — COMPREHENSIVE METABOLIC PANEL
ALT: 21 IU/L (ref 0–32)
AST: 15 IU/L (ref 0–40)
Albumin/Globulin Ratio: 2.3 — ABNORMAL HIGH (ref 1.2–2.2)
Albumin: 4.5 g/dL (ref 3.8–4.9)
Alkaline Phosphatase: 134 IU/L — ABNORMAL HIGH (ref 39–117)
BUN/Creatinine Ratio: 13 (ref 9–23)
BUN: 10 mg/dL (ref 6–24)
Bilirubin Total: 0.4 mg/dL (ref 0.0–1.2)
CO2: 24 mmol/L (ref 20–29)
Calcium: 10.3 mg/dL — ABNORMAL HIGH (ref 8.7–10.2)
Chloride: 100 mmol/L (ref 96–106)
Creatinine, Ser: 0.76 mg/dL (ref 0.57–1.00)
GFR calc Af Amer: 104 mL/min/{1.73_m2} (ref >59)
GFR calc non Af Amer: 90 mL/min/{1.73_m2} (ref >59)
Globulin, Total: 2 g/dL (ref 1.5–4.5)
Glucose: 149 mg/dL — ABNORMAL HIGH (ref 65–99)
Potassium: 4.4 mmol/L (ref 3.5–5.2)
Sodium: 143 mmol/L (ref 134–144)
Total Protein: 6.5 g/dL (ref 6.0–8.5)

## 2018-09-13 NOTE — Telephone Encounter (Signed)
Called, LVM for pt about results per Dr. Sater note. Gave [facility] phone number if she has further questions.  

## 2018-09-13 NOTE — Telephone Encounter (Signed)
-----   Message from Britt Bottom, MD sent at 09/13/2018 11:40 AM EDT ----- Please let the patient know that the lab work is fine. Low lymphocytes are expected with Gilenya

## 2018-09-28 ENCOUNTER — Other Ambulatory Visit: Payer: Self-pay | Admitting: Neurology

## 2018-09-28 NOTE — Telephone Encounter (Signed)
Pt is calling for a refill on her  dextroamphetamine (DEXEDRINE) 10 MG 24 hr capsule [facility] [phone] - Pt aware office will reopen on Tues of next week

## 2018-10-02 ENCOUNTER — Other Ambulatory Visit: Payer: Self-pay | Admitting: Neurology

## 2018-10-02 ENCOUNTER — Other Ambulatory Visit: Payer: Self-pay | Admitting: *Deleted

## 2018-10-02 MED ORDER — DEXTROAMPHETAMINE SULFATE ER 10 MG PO CP24: 20.0000 mg | ORAL_CAPSULE | Freq: Every day | ORAL | 0 refills | Status: DC

## 2018-10-02 NOTE — Addendum Note (Signed)
Addended by: Hope Pigeon on: 10/02/2018 06:59 AM   Modules accepted: Orders

## 2018-10-29 ENCOUNTER — Telehealth: Payer: Self-pay | Admitting: Neurology

## 2018-10-29 NOTE — Telephone Encounter (Addendum)
Gave completed/signed form back to Hilda Blades in medical records to process for pt.

## 2018-10-29 NOTE — Telephone Encounter (Signed)
Pt has called for RN Terrence Dupont to inquire about the reasonable accomodations paperwork she brought to the office for her job

## 2018-10-30 ENCOUNTER — Telehealth: Payer: Self-pay | Admitting: *Deleted

## 2018-10-30 NOTE — Telephone Encounter (Signed)
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

## 2018-11-01 ENCOUNTER — Other Ambulatory Visit: Payer: Self-pay | Admitting: Neurology

## 2018-11-01 MED ORDER — DEXTROAMPHETAMINE SULFATE ER 10 MG PO CP24: 20.0000 mg | ORAL_CAPSULE | Freq: Every day | ORAL | 0 refills | Status: DC

## 2018-11-01 NOTE — Telephone Encounter (Signed)
Pt is requesting a refill of dextroamphetamine (DEXEDRINE) 10 MG 24 hr capsule , to be sent to [id number] - [place], [place] - [address]

## 2018-11-01 NOTE — Telephone Encounter (Signed)
Reviewed pt chart. Patient last seen 09/12/2018. Next f/u 03/14/19. Checked drug registry and she last refilled rx 10/03/18 #[id number].

## 2018-12-03 ENCOUNTER — Other Ambulatory Visit: Payer: Self-pay | Admitting: Neurology

## 2018-12-03 MED ORDER — DEXTROAMPHETAMINE SULFATE ER 10 MG PO CP24: 20.0000 mg | ORAL_CAPSULE | Freq: Every day | ORAL | 0 refills | Status: DC

## 2018-12-03 NOTE — Telephone Encounter (Signed)
Pt is requesting a refill of dextroamphetamine (DEXEDRINE) 10 MG 24 hr capsule , to be sent to [place], [place]

## 2018-12-06 DIAGNOSIS — M67432 Ganglion, left wrist: Secondary | ICD-10-CM | POA: Diagnosis not present

## 2018-12-11 ENCOUNTER — Telehealth: Payer: Self-pay | Admitting: Nurse Practitioner

## 2018-12-11 NOTE — Telephone Encounter (Signed)

## 2018-12-12 ENCOUNTER — Encounter: Payer: Self-pay | Admitting: Nurse Practitioner

## 2018-12-12 ENCOUNTER — Other Ambulatory Visit: Payer: Self-pay

## 2018-12-12 ENCOUNTER — Ambulatory Visit: Payer: Federal, State, Local not specified - PPO | Admitting: Nurse Practitioner

## 2018-12-12 VITALS — BP 138/82 | HR 106 | Temp 97.7°F | Ht 65.5 in | Wt 223.6 lb

## 2018-12-12 DIAGNOSIS — E1142 Type 2 diabetes mellitus with diabetic polyneuropathy: Secondary | ICD-10-CM

## 2018-12-12 DIAGNOSIS — Z23 Encounter for immunization: Secondary | ICD-10-CM

## 2018-12-12 DIAGNOSIS — M778 Other enthesopathies, not elsewhere classified: Secondary | ICD-10-CM

## 2018-12-12 MED ORDER — JANUMET XR 100-1000 MG PO TB24: 1.0000 | ORAL_TABLET | Freq: Every day | ORAL | 0 refills | Status: DC

## 2018-12-12 NOTE — Progress Notes (Signed)
Subjective:  Patient ID: Cathy [name], female    DOB: 01/09/1966  Age: 53 y.o. MRN: [id number]  CC: Establish Care (new pt/left arm painful--pt fall about 2 mo ago---no otc--med refills?)   Arm Pain  The incident occurred more than 1 week ago. The incident occurred at home. The injury mechanism was a fall and twisted. The pain is present in the left forearm. The quality of the pain is described as aching. Radiates to: wrist. The pain has been intermittent since the incident. Pertinent negatives include no muscle weakness, numbness or tingling. The symptoms are aggravated by movement, palpation and lifting. She has tried rest for the symptoms. The treatment provided mild relief.  she works from home on desktop.  DM:  She needs janumet refilled. [facility] hgbA1c of 7.6, normal renal function. Does not check glucose at home.  Reviewed past Medical, Social and Family history today.  Outpatient Medications Prior to Visit  Medication Sig Dispense Refill   atorvastatin (LIPITOR) 80 MG tablet Take 80 mg by mouth daily at 6 PM.   11   Cholecalciferol (VITAMIN D) 2000 UNITS CAPS Take 2,000 Units by mouth daily.     dextroamphetamine (DEXEDRINE) 10 MG 24 hr capsule Take 2-4 capsules (20-40 mg total) by mouth daily. 120 capsule 0   diclofenac Sodium (VOLTAREN) 1 % GEL Apply topically.     Fingolimod HCl (GILENYA) 0.5 MG CAPS TAKE ONE CAPSULE (0.5 MG) BY MOUTH EACH DAY. MAY TAKE WITH OR WITHOUTFOOD. STORE AT ROOM TEMPERATURE. 90 capsule 3   loratadine (CLARITIN) 10 MG tablet Take 10 mg by mouth daily.     losartan-hydrochlorothiazide (HYZAAR) 50-12.5 MG per tablet Take 1 tablet by mouth daily.      triamcinolone cream (KENALOG) 0.1 % Apply topically 3 (three) times daily. 45 g 1   SitaGLIPtin-MetFORMIN HCl (JANUMET XR) [phone] MG TB24 Take by mouth.     imipramine (TOFRANIL) 25 MG tablet Take 1 tablet (25 mg total) by mouth at bedtime. (Patient  not taking: Reported on 12/12/2018) 30 tablet 5   Facility-Administered Medications Prior to Visit  Medication Dose Route Frequency Provider Last Rate Last Dose   gadopentetate dimeglumine (MAGNEVIST) injection 20 mL  20 mL Intravenous Once PRN Sater, Pearletha Furlichard A, MD        ROS See HPI  Objective:  BP 138/82    Pulse (!) 106    Temp 97.7 F (36.5 C) (Tympanic)    Ht 5' 5.5" (1.664 m)    Wt 223 lb 9.6 oz (101.4 kg)    SpO2 97%    BMI 36.64 kg/m   BP Readings from Last 3 Encounters:  12/12/18 138/82  09/12/18 (!) 151/89  12/20/17 138/84    Wt Readings from Last 3 Encounters:  12/12/18 223 lb 9.6 oz (101.4 kg)  09/12/18 227 lb (103 kg)  12/20/17 222 lb 8 oz (100.9 kg)    Physical Exam Vitals signs reviewed.  Constitutional:      Appearance: She is obese.  Cardiovascular:     Rate and Rhythm: Normal rate and regular rhythm.     Pulses: Normal pulses.     Heart sounds: Normal heart sounds.  Pulmonary:     Effort: Pulmonary effort is normal.     Breath sounds: Normal breath sounds.  Musculoskeletal:        General: Tenderness present. No swelling or deformity.     Right elbow: Normal.    Left elbow: Tenderness found.  Lateral epicondyle tenderness noted. No radial head, no medial epicondyle and no olecranon process tenderness noted.     Left wrist: She exhibits tenderness and swelling. She exhibits normal range of motion, no bony tenderness and no effusion.     Right forearm: Normal.     Left forearm: Normal.     Left hand: Normal.     Right lower leg: No edema.     Left lower leg: No edema.     Comments: Tenderness with supination  Skin:    General: Skin is warm and dry.     Findings: No erythema or rash.  Neurological:     Mental Status: She is alert and oriented to person, place, and time.    Lab Results  Component Value Date   WBC 5.2 09/12/2018   HGB 12.2 09/12/2018   HCT 35.5 09/12/2018   PLT 331 09/12/2018   GLUCOSE 149 (H) 09/12/2018   CHOL 171  08/27/2009   TRIG 68.0 08/27/2009   HDL 47.70 08/27/2009   LDLCALC 110 (H) 08/27/2009   ALT 21 09/12/2018   AST 15 09/12/2018   NA 143 09/12/2018   K 4.4 09/12/2018   CL 100 09/12/2018   CREATININE 0.76 09/12/2018   BUN 10 09/12/2018   CO2 24 09/12/2018   TSH 1.27 08/27/2009    Mr Brain W Wo Contrast  Result Date: 01/31/2016  [facility] [address] [place], [place] [address] [phone] NEUROIMAGING REPORT STUDY DATE: 01/29/2016 PATIENT NAME: Cathy [name] DOB: 09-24-1965 MRN: [id number] EXAM: MRI Brain with and without contrast ORDERING CLINICIAN: Richard A. Sater, MD. PhD CLINICAL HISTORY: 53 year old woman with multiple sclerosis and gait disturbance COMPARISON FILMS: 01/21/2015 TECHNIQUE:MRI of the brain with and without contrast was obtained utilizing 5 mm axial slices with T1, T2, T2 flair, SWI and diffusion weighted views.  T1 sagittal, T2 coronal and postcontrast views in the axial and coronal plane were obtained. CONTRAST: 20 ml Multihance IMAGING SITE: [facility], [place]. FINDINGS: On sagittal images, the spinal cord is imaged caudally to C3 and is normal in caliber.  There is FLAIR hyperintensity posteriorly within the spinal cord adjacent to C2-C3. This is better evaluated on the dedicated cervical spine MRI also performed today. There is metal artifact consistent with ACDF at C3-C4.  The contents of the posterior fossa are of normal size and position.   The pituitary gland and optic chiasm appear normal.    Brain volume appears normal.   The ventricles are normal in size and without distortion.  There are no abnormal extra-axial collections of fluid.  There are two T2/FLAIR hyperintense foci within the left cerebellar hemisphere, one adjacent to the fourth ventricle next to the middle cerebellar peduncle and the other more laterally in the hemisphere.  The brainstem appears normal.   The deep gray matter appears normal.  In the  hemispheres, there are multiple T2/FLAIR hyperintense foci in the periventricular, deep and juxtacortical white matter. One focus is in the left internal capsule.  Many of the periventricular foci are radially oriented to the ventricles. None of the foci appears to be acute. Some foci are hypointense on T1-weighted images.  Diffusion weighted images are normal.  Susceptibility weighted images are normal.   The orbits appear normal.   The VIIth/VIIIth nerve complex appears normal.  The mastoid air cells appear normal.  The paranasal sinuses appear normal.  Flow voids are identified within the major intracerebral arteries.   After the infusion  of contrast material, a normal enhancement pattern is noted. Compared to the MRI of the brain dated 01/21/2015, there is no interval change.    This MRI of the brain with and without contrast shows the following: 1.   There are multiple T2/FLAIR hyperintense foci in the water, cerebellum and in the periventricular, juxtacortical and deep white matter of both hemispheres in a pattern and configuration consistent with chronic demyelinating plaque associated with multiple sclerosis. None of the foci appear to be acute.  When compared to the MRI of the brain dated 01/21/2015, there is no interval change. 2.    There is a normal enhancement pattern and there are no acute findings. INTERPRETING PHYSICIAN: Richard A. Epimenio Foot, MD, PhD Certified in  Neuroimaging by American Society of Neuroimaging   Mr Cervical Spine W Wo Contrast  Result Date: 01/31/2016  [facility] [address] [place], [place] [address] [phone] NEUROIMAGING REPORT STUDY DATE: 01/29/2016 PATIENT NAME: Cathy [name] DOB: [birth year] MRN: [id number] EXAM: MRI of the cervical spine with and without contrast ORDERING CLINICIAN: Asa Lente M.D. PhD CLINICAL HISTORY: 53 year old woman with multiple sclerosis and gait disturbance COMPARISON FILMS: None available TECHNIQUE: MRI of the  cervical spine was obtained utilizing 3 mm sagittal slices from the posterior fossa down to the T3-4 level with T1, T2 and inversion recovery views. In addition 4 mm axial slices from C2-3 down to T1-2 level were included with T2 and gradient echo views. After the administration of contrast material, additional T1-weighted images were performed. CONTRAST: 20 mL MultiHance IMAGING SITE: [facility], [address] [place], [place], [place] FINDINGS: :  On sagittal images, the spine is imaged from above the cervicomedullary junction to T2.   The spinal cord is of normal caliber.   There are T2 hyperintense foci within the spinal cord posteriorly adjacent to C2-C3, to the left adjacent to C2-C3, laterally to the right adjacent to C6 and posterolaterally to the right adjacent to C7. There has been prior ACDF at C3-C6.  The vertebral bodies are normally aligned.    The discs and interspaces were further evaluated on axial views from C2 to T2 as follows: C2 - C3:  The disc and interspace appear normal. C3 - C4:  This level has been fused.   There is mild uncovertebral spurring causing mild left foraminal narrowing. There is no nerve root compression. C4 - C5:  This level has been fused. C5 - C6:  This level has been fused.   Moderately severe right uncovertebral spurring causing moderately severe right foraminal narrowing. There is possible right C6 nerve root compression. C6 - C7:  There is mild disc protrusion mild uncovertebral spurring. The neural foramina are mildly narrowed but there is no nerve root compression.   There is mild spinal stenosis. C7 - T1:  The disc and interspace appear normal. After the administration of contrast material, a normal enhancement pattern was observed.    This MRI of the cervical spine with and without contrast shows the following: 1.    There are several foci within the spinal cord consistent with chronic demyelinating plaque associated with multiple sclerosis. These are located  posteriorly adjacent to C2-C3, to the left adjacent to C2-C3, to the right adjacent to C6 and to the right adjacent to C7. None of these appear to be acute. They do not enhance. 2.   ACDF at C3-C6. 3.   At C5-C6 there is moderately severe right uncovertebral spurring causing moderately severe right foraminal  narrowing also right C6 nerve root compression. 4.   At C6-C7, there is disc protrusion and uncovertebral spurring causing mild foraminal narrowing and mild spinal stenosis but no nerve root compression. INTERPRETING PHYSICIAN: Richard A. Epimenio Foot, MD, PhD Certified in  Neuroimaging by American Society of Neuroimaging    Assessment & Plan:   Jonice was seen today for establish care.  Diagnoses and all orders for this visit:  Type 2 diabetes mellitus with diabetic polyneuropathy, without long-term current use of insulin (HCC) -     SitaGLIPtin-MetFORMIN HCl (JANUMET XR) [phone] MG TB24; Take 1 tablet by mouth daily after breakfast.  Tendonitis of elbow, left  Need for influenza vaccination -     Flu Vaccine QUAD High Dose(Fluad)   I have changed Cathy [name]'s SitaGLIPtin-MetFORMIN HCl to Janumet XR. I am also having her maintain her Vitamin D, loratadine, atorvastatin, losartan-hydrochlorothiazide, Fingolimod HCl, triamcinolone cream, imipramine, dextroamphetamine, and diclofenac Sodium.  Meds ordered this encounter  Medications   SitaGLIPtin-MetFORMIN HCl (JANUMET XR) [phone] MG TB24    Sig: Take 1 tablet by mouth daily after breakfast.    Dispense:  90 tablet    Refill:  0    Order Specific Question:   Supervising Provider    Answer:   Dianne Dun [[id number]]    Problem List Items Addressed This Visit      Endocrine   Type 2 diabetes mellitus with diabetic polyneuropathy, without long-term current use of insulin (HCC) - Primary   Relevant Medications   SitaGLIPtin-MetFORMIN HCl (JANUMET XR) [phone] MG TB24    Other Visit Diagnoses    Tendonitis of elbow, left        Need for influenza vaccination       Relevant Orders   Flu Vaccine QUAD High Dose(Fluad) (Completed)       Follow-up: Return in about 2 months (around 02/11/2019) for DM and HTN, hyperlipidemia (fasting, F2F, ).  Alysia Penna, NP

## 2018-12-12 NOTE — Patient Instructions (Signed)
Elbow tendonitis: use diclofenac gel BID Apply cold compress 2-3x/day, 69mins at a time. Use elbow brace daily. F/up with Dr. Len Childs if no improvement in 2-3weeks  Elbow and Forearm Exercises Ask your health care provider which exercises are safe for you. Do exercises exactly as told by your health care provider and adjust them as directed. It is normal to feel mild stretching, pulling, tightness, or discomfort as you do these exercises. Stop right away if you feel sudden pain or your pain gets worse. Do not begin these exercises until told by your health care provider. Range-of-motion exercises These exercises warm up your muscles and joints and improve the movement and flexibility of your injured elbow and forearm. The exercises also help to relieve pain, numbness, and tingling. These exercises are done using the muscles in your injured elbow and forearm (active). Elbow flexion, active 1. Hold your left / right arm at your side, and bend your elbow (flexion) as far as you can using only your arm muscles. 2. Hold this position for __________ seconds. 3. Slowly return to the starting position. Repeat __________ times. Complete this exercise __________ times a day. Elbow extension, active 1. Hold your left / right arm at your side, and straighten your elbow (extension) as much as you can using only your arm muscles. 2. Hold this position for __________ seconds. 3. Slowly return to the starting position. Repeat __________ times. Complete this exercise __________ times a day. Active forearm rotation, supination This is an exercise in which you turn (rotate) your forearm palm up (supination). 1. Stand or sit with your elbows at your sides. 2. Bend your left / right elbow to a 90-degree angle (right angle). 3. Rotate your palm up until you feel a gentle stretch on the inside of your forearm. 4. Hold this position for __________ seconds. 5. Slowly return to the starting position. Repeat  __________ times. Complete this exercise __________ times a day. Active forearm rotation, pronation This is an exercise in which you turn (rotate) your forearm palm down (pronation). 1. Stand or sit with your elbows at your sides. 2. Bend your left / right elbow to a 90-degree angle (right angle). 3. Rotate your palm down until you feel a gentle stretch on the top of your forearm. 4. Hold this position for __________ seconds. 5. Slowly return to the starting position. Repeat __________ times. Complete this exercise __________ times a day. Stretching exercises These exercises warm up your muscles and joints and improve the movement and flexibility of your injured elbow and forearm. These exercises also help to relieve pain, numbness, and tingling. These exercises are done using your healthy elbow and forearm to help stretch the muscles in your injured elbow and forearm (active-assisted). Elbow flexion, active-assisted  1. Hold your left / right arm at your side, and bend your elbow (flexion) as much as you can using your left / right arm muscles. 2. Use your other hand to bend your left / right elbow farther. To do this, gently push up on your forearm until you feel a gentle stretch on the back of your elbow. 3. Hold this position for __________ seconds. 4. Slowly return to the starting position. Repeat __________ times. Complete this exercise __________ times a day. Elbow extension, active-assisted  1. Hold your left / right arm at your side, and straighten your elbow (extension) as much as you can using your left / right arm muscles. 2. Use your other hand to straighten the left / right elbow  farther. To do this, gently push down on your forearm until you feel a gentle stretch on the inside of your elbow. 3. Hold this position for __________ seconds. 4. Slowly return to the starting position. Repeat __________ times. Complete this exercise __________ times a day. Active-assisted forearm  rotation, supination This is an exercise in which you turn (rotate) your forearm palm up (supination). 1. Sit with your left / right elbow bent in a 90-degree angle (right angle) with your forearm resting on a table. 2. Keeping your upper body and shoulder still, rotate your forearm so your palm faces upward. 3. Use your other hand to help rotate your forearm further until you feel a gentle to moderate stretch. 4. Hold this position for __________ seconds. 5. Slowly release the stretch and return to the starting position. Repeat __________ times. Complete this exercise __________ times a day. Active-assisted forearm rotation, pronation This is an exercise in which you turn (rotate) your forearm palm down (pronation). 1. Sit with your left / right elbow bent in a 90-degree angle (right angle) with your forearm resting on a table. 2. Keeping your upper body and shoulder still, rotate your forearm so your palm faces the tabletop. 3. Use your other hand to help rotate your forearm further until you feel a gentle to moderate stretch. 4. Hold this position for __________ seconds. 5. Slowly release the stretch and return to the starting position. Repeat __________ times. Complete this exercise __________ times a day. Passive elbow flexion, supine 1. Lie on your back (supine position). 2. Extend your left / right arm up in the air, bracing it with your other hand. 3. Let your left / right hand slowly lower toward your shoulder (passive flexion), while your elbow stays pointed toward the ceiling. You should feel a gentle stretch along the back of your upper arm and elbow. 4. If instructed by your health care provider, you may increase the intensity of your stretch by adding a small wrist weight or hand weight. 5. Hold this position for __________ seconds. 6. Slowly return to the starting position. Repeat __________ times. Complete this exercise __________ times a day. Passive elbow extension, supine   1. Lie on your back (supine position). Make sure that you are in a comfortable position that lets you relax your arm muscles. 2. Place a folded towel under your left / right upper arm so your elbow and shoulder are at the same height. Straighten your left / right arm so your elbow does not rest on the bed or towel. 3. Let the weight of your hand stretch your elbow (passive extension). Keep your arm and chest muscles relaxed. You should feel a stretch on the inside of your elbow. 4. If told by your health care provider, you may increase the intensity of your stretch by adding a small wrist weight or hand weight. 5. Hold this position for __________ seconds. 6. Slowly release the stretch. Repeat __________ times. Complete this exercise __________ times a day. Strengthening exercises These exercises build strength and endurance in your elbow and forearm. Endurance is the ability to use your muscles for a long time, even after they get tired. Elbow flexion, isometric  1. Stand or sit up straight. 2. Bend your left / right elbow in a 90-degree angle (right angle), and keep your forearm at the height of your waist. Your thumb should be pointed toward the ceiling (neutral forearm). 3. Place your other hand on top of your left / right forearm. Gently  push down while you resist with your left / right arm (isometric flexion). Push as hard as you can with both arms without causing any pain or movement at your left / right elbow. 4. Hold this position for __________ seconds. 5. Slowly release the tension in both arms. Let your muscles relax completely before you repeat the exercise. Repeat __________ times. Complete this exercise __________ times a day. Elbow extension, isometric  1. Stand or sit up straight. 2. Place your left / right arm so your palm faces your abdomen and is at the height of your waist. 3. Place your other hand on the underside of your left / right forearm. Gently push up while you  resist with your left / right arm (isometric extension). Push as hard as you can with both arms without causing any pain or movement at your left / right elbow. 4. Hold this position for __________ seconds. 5. Slowly release the tension in both arms. Let your muscles relax completely before you repeat the exercise. Repeat __________ times. Complete this exercise __________ times a day. Elbow flexion with forearm palm up  1. Sit on a firm chair without armrests, or stand up. 2. Place your left / right arm at your side with your elbow straight and your palm facing forward. 3. Holding a __________weight or gripping a rubber exercise band or tubing, bend your elbow to bring your hand toward your shoulder (flexion). 4. Hold this position for __________ seconds. 5. Slowly return to the starting position. Repeat __________ times. Complete this exercise __________ times a day. Elbow extension, active  1. Sit on a firm chair without armrests, or stand up. 2. Hold a rubber exercise band or tubing in both hands. 3. Keeping your upper arms at your sides, bring both hands up to your left / right shoulder. Keep your left / right hand just below your other hand. 4. Straighten your left / right elbow (extension) while keeping your other arm still. 5. Hold this position for __________ seconds. 6. Control the resistance of the band or tubing as you return to the starting position. Repeat __________ times. Complete this exercise __________ times a day. Forearm rotation, supination  1. Sit with your left / right forearm supported on a table. Your elbow should be at waist height and bent at a 90-degree angle (right angle). 2. Gently grasp a lightweight hammer. 3. Rest your hand over the edge of the table with your palm facing down. 4. Without moving your left / right elbow, slowly rotate your forearm to turn your palm up toward the ceiling (supination). 5. Hold this position for __________ seconds. 6. Slowly  return to the starting position. Repeat __________ times. Complete this exercise __________ times a day. Forearm rotation, pronation  1. Sit with your left / right forearm supported on a table. Keep your elbow below shoulder height. 2. Gently grasp a lightweight hammer. 3. Rest your hand over the edge of the table with your palm facing up. 4. Without moving your left / right elbow, slowly rotate your forearm to turn your palm down toward the floor (pronation). 5. Hold this position for __________seconds. 6. Slowly return to the starting position. Repeat __________ times. Complete this exercise __________ times a day. This information is not intended to replace advice given to you by your health care provider. Make sure you discuss any questions you have with your health care provider. Document Released: 11/24/2004 Document Revised: 05/03/2018 Document Reviewed: 01/31/2018 Elsevier Patient Education  2020 [employer]  Inc.  

## 2018-12-15 ENCOUNTER — Encounter: Payer: Self-pay | Admitting: Nurse Practitioner

## 2018-12-24 ENCOUNTER — Ambulatory Visit: Payer: Self-pay | Admitting: Neurology

## 2018-12-31 ENCOUNTER — Telehealth: Payer: Self-pay | Admitting: Nurse Practitioner

## 2018-12-31 MED ORDER — LOSARTAN POTASSIUM-HCTZ 50-12.5 MG PO TABS: 1.0000 | ORAL_TABLET | Freq: Every day | ORAL | 1 refills | Status: DC

## 2018-12-31 NOTE — Telephone Encounter (Signed)
Pt request refill for losartan/HCTZ 50/12.5 mg tabs 1 tab daily. We never prescribe this rx before, please advise. Pt has an appt with you 02/11/2019.

## 2018-12-31 NOTE — Telephone Encounter (Signed)
Ok to send 90 with 1 refill

## 2019-01-02 ENCOUNTER — Other Ambulatory Visit: Payer: Self-pay | Admitting: Neurology

## 2019-01-02 MED ORDER — DEXTROAMPHETAMINE SULFATE ER 10 MG PO CP24: 20.0000 mg | ORAL_CAPSULE | Freq: Every day | ORAL | 0 refills | Status: DC

## 2019-01-02 NOTE — Telephone Encounter (Signed)
1) Medication(s) Requested (by name): dextroamphetamine (DEXEDRINE) 10 MG 24 hr capsule  2) Pharmacy of Choice: [facility] [place], [place] - [address] AT [address]

## 2019-01-29 ENCOUNTER — Other Ambulatory Visit: Payer: Self-pay | Admitting: Neurology

## 2019-01-29 MED ORDER — DEXTROAMPHETAMINE SULFATE ER 10 MG PO CP24: 20.0000 mg | ORAL_CAPSULE | Freq: Every day | ORAL | 0 refills | Status: DC

## 2019-01-29 NOTE — Telephone Encounter (Signed)
Pt is needing a refill on her dextroamphetamine (DEXEDRINE) 10 MG 24 hr capsule sent in to the [facility] in [place]

## 2019-02-04 DIAGNOSIS — Z20822 Contact with and (suspected) exposure to covid-19: Secondary | ICD-10-CM | POA: Diagnosis not present

## 2019-02-04 DIAGNOSIS — M67432 Ganglion, left wrist: Secondary | ICD-10-CM | POA: Diagnosis not present

## 2019-02-04 DIAGNOSIS — Z01812 Encounter for preprocedural laboratory examination: Secondary | ICD-10-CM | POA: Diagnosis not present

## 2019-02-08 DIAGNOSIS — R2232 Localized swelling, mass and lump, left upper limb: Secondary | ICD-10-CM | POA: Diagnosis not present

## 2019-02-08 DIAGNOSIS — X58XXXA Exposure to other specified factors, initial encounter: Secondary | ICD-10-CM | POA: Diagnosis not present

## 2019-02-08 DIAGNOSIS — M67432 Ganglion, left wrist: Secondary | ICD-10-CM | POA: Diagnosis not present

## 2019-02-08 DIAGNOSIS — S66313A Strain of extensor muscle, fascia and tendon of left middle finger at wrist and hand level, initial encounter: Secondary | ICD-10-CM | POA: Diagnosis not present

## 2019-02-08 DIAGNOSIS — D2112 Benign neoplasm of connective and other soft tissue of left upper limb, including shoulder: Secondary | ICD-10-CM | POA: Diagnosis not present

## 2019-02-11 ENCOUNTER — Other Ambulatory Visit: Payer: Self-pay

## 2019-02-11 ENCOUNTER — Encounter: Payer: Self-pay | Admitting: Nurse Practitioner

## 2019-02-11 ENCOUNTER — Ambulatory Visit (INDEPENDENT_AMBULATORY_CARE_PROVIDER_SITE_OTHER): Payer: Federal, State, Local not specified - PPO | Admitting: Nurse Practitioner

## 2019-02-11 VITALS — BP 130/82 | HR 97 | Temp 97.0°F | Ht 65.5 in | Wt 225.7 lb

## 2019-02-11 DIAGNOSIS — E559 Vitamin D deficiency, unspecified: Secondary | ICD-10-CM | POA: Diagnosis not present

## 2019-02-11 DIAGNOSIS — E782 Mixed hyperlipidemia: Secondary | ICD-10-CM | POA: Diagnosis not present

## 2019-02-11 DIAGNOSIS — E1142 Type 2 diabetes mellitus with diabetic polyneuropathy: Secondary | ICD-10-CM | POA: Diagnosis not present

## 2019-02-11 DIAGNOSIS — I10 Essential (primary) hypertension: Secondary | ICD-10-CM

## 2019-02-11 LAB — LIPID PANEL
Cholesterol: 145 mg/dL (ref 0–200)
HDL: 49.8 mg/dL (ref >39.00)
LDL Cholesterol: 73 mg/dL (ref 0–99)
NonHDL: 95.63
Total CHOL/HDL Ratio: 3
Triglycerides: 114 mg/dL (ref 0.0–149.0)
VLDL: 22.8 mg/dL (ref 0.0–40.0)

## 2019-02-11 LAB — BASIC METABOLIC PANEL
BUN: 10 mg/dL (ref 6–23)
CO2: 28 mEq/L (ref 19–32)
Calcium: 9.8 mg/dL (ref 8.4–10.5)
Chloride: 98 mEq/L (ref 96–112)
Creatinine, Ser: 0.73 mg/dL (ref 0.40–1.20)
GFR: 100.83 mL/min (ref >60.00)
Glucose, Bld: 258 mg/dL — ABNORMAL HIGH (ref 70–99)
Potassium: 3.8 mEq/L (ref 3.5–5.1)
Sodium: 136 mEq/L (ref 135–145)

## 2019-02-11 LAB — HEPATIC FUNCTION PANEL
ALT: 24 U/L (ref 0–35)
AST: 18 U/L (ref 0–37)
Albumin: 4.2 g/dL (ref 3.5–5.2)
Alkaline Phosphatase: 114 U/L (ref 39–117)
Bilirubin, Direct: 0.1 mg/dL (ref 0.0–0.3)
Total Bilirubin: 0.5 mg/dL (ref 0.2–1.2)
Total Protein: 6.9 g/dL (ref 6.0–8.3)

## 2019-02-11 LAB — HEMOGLOBIN A1C: Hgb A1c MFr Bld: 7.9 % — ABNORMAL HIGH (ref 4.6–6.5)

## 2019-02-11 LAB — TSH: TSH: 3.01 u[IU]/mL (ref 0.35–4.50)

## 2019-02-11 LAB — CK: Total CK: 52 U/L (ref 7–177)

## 2019-02-11 NOTE — Progress Notes (Signed)
Subjective:  Patient ID: Cathy [name], female    DOB: [birth year]  Age: 54 y.o. MRN: [id number]  CC: Follow-up (DM and HTN, hyperlipidemia (fasting, F2F, 21mins)refill for lipid/ )  HPI  DM, HTN, and hyperlipidemia: Elevated glucose: 150s-200s Non compliant with diet Compliant with medications. Needs referral to ophthalmology and urine microalbumin BP at goal with losartan/HCTZ LDL at goal with atorvastatin BP Readings from Last 3 Encounters:  02/11/19 130/82  12/12/18 138/82  09/12/18 (!) 151/89   MS: Managed by Dr. Felecia [name]. Denies any fall in last 6months. Reports worsening abnormal gait with decreased exercise in last 50months.  Reviewed past Medical, Social and Family history today.  Outpatient Medications Prior to Visit  Medication Sig Dispense Refill  . Cholecalciferol (VITAMIN D) 2000 UNITS CAPS Take 2,000 Units by mouth daily.    Marland Kitchen dextroamphetamine (DEXEDRINE) 10 MG 24 hr capsule Take 2-4 capsules (20-40 mg total) by mouth daily. 120 capsule 0  . diclofenac Sodium (VOLTAREN) 1 % GEL Apply topically.    . Fingolimod HCl (GILENYA) 0.5 MG CAPS TAKE ONE CAPSULE (0.5 MG) BY MOUTH EACH DAY. MAY TAKE WITH OR WITHOUTFOOD. STORE AT ROOM TEMPERATURE. 90 capsule 3  . imipramine (TOFRANIL) 25 MG tablet Take 1 tablet (25 mg total) by mouth at bedtime. 30 tablet 5  . loratadine (CLARITIN) 10 MG tablet Take 10 mg by mouth daily.    Marland Kitchen triamcinolone cream (KENALOG) 0.1 % Apply topically 3 (three) times daily. 45 g 1  . atorvastatin (LIPITOR) 80 MG tablet Take 80 mg by mouth daily at 6 PM.   11  . losartan-hydrochlorothiazide (HYZAAR) 50-12.5 MG tablet Take 1 tablet by mouth daily. 90 tablet 1  . SitaGLIPtin-MetFORMIN HCl (JANUMET XR) [phone] MG TB24 Take 1 tablet by mouth daily after breakfast. 90 tablet 0   Facility-Administered Medications Prior to Visit  Medication Dose Route Frequency Provider Last Rate Last Admin  . gadopentetate dimeglumine (MAGNEVIST) injection 20 mL  20 mL  Intravenous Once PRN Sater, Richard A, MD        ROS See HPI  Objective:  BP 130/82   Pulse 97   Temp (!) 97 F (36.1 C) (Tympanic)   Ht 5' 5.5" (1.664 m)   Wt 225 lb 11.2 oz (102.4 kg)   SpO2 98%   BMI 36.99 kg/m   BP Readings from Last 3 Encounters:  02/11/19 130/82  12/12/18 138/82  09/12/18 (!) 151/89    Wt Readings from Last 3 Encounters:  02/11/19 225 lb 11.2 oz (102.4 kg)  12/12/18 223 lb 9.6 oz (101.4 kg)  09/12/18 227 lb (103 kg)    Physical Exam Constitutional:      Appearance: She is obese.  Cardiovascular:     Rate and Rhythm: Normal rate and regular rhythm.     Pulses: Normal pulses.     Heart sounds: Normal heart sounds.  Pulmonary:     Effort: Pulmonary effort is normal.     Breath sounds: Normal breath sounds.  Abdominal:     General: Bowel sounds are normal.     Palpations: Abdomen is soft.  Musculoskeletal:     Cervical back: Normal range of motion and neck supple.     Right lower leg: No edema.     Left lower leg: No edema.  Lymphadenopathy:     Cervical: No cervical adenopathy.  Skin:    Findings: No rash.     Comments: Diabetic foot exam completed  Neurological:     Mental Status: She  is alert and oriented to person, place, and time.     Cranial Nerves: No dysarthria or facial asymmetry.     Motor: Atrophy present.     Coordination: Heel to Shin Test abnormal. Finger-Nose-Finger Test normal.     Gait: Gait abnormal and tandem walk abnormal.     Comments: Right hand muscle atrophy noted (Ms. Cathy [name] states it is stable).     Lab Results  Component Value Date   WBC 5.2 09/12/2018   HGB 12.2 09/12/2018   HCT 35.5 09/12/2018   PLT 331 09/12/2018   GLUCOSE 258 (H) 02/11/2019   CHOL 145 02/11/2019   TRIG 114.0 02/11/2019   HDL 49.80 02/11/2019   LDLCALC 73 02/11/2019   ALT 24 02/11/2019   AST 18 02/11/2019   NA 136 02/11/2019   K 3.8 02/11/2019   CL 98 02/11/2019   CREATININE 0.73 02/11/2019   BUN 10 02/11/2019   CO2 28  02/11/2019   TSH 3.01 02/11/2019   HGBA1C 7.9 (H) 02/11/2019   Assessment & Plan:  This visit occurred during the SARS-CoV-2 public health emergency.  Safety protocols were in place, including screening questions prior to the visit, additional usage of staff PPE, and extensive cleaning of exam room while observing appropriate contact time as indicated for disinfecting solutions.   Cathy [name] was seen today for follow-up.  Diagnoses and all orders for this visit:  Type 2 diabetes mellitus with diabetic polyneuropathy, without long-term current use of insulin (HCC) -     DM_1 Hemoglobin A1c -     Cancel: DM_2 Microalbumin / creatinine urine ratio -     HTN_1 BMP -     DM_eye -     Microalbumin / creatinine urine ratio -     SitaGLIPtin-MetFORMIN HCl (JANUMET XR) [phone] MG TB24; Take 1 tablet by mouth daily after breakfast.  Vitamin D deficiency -     Vitamin D 1,25 dihydroxy  Essential (primary) hypertension -     HTN_1 BMP -     TSH -     Microalbumin / creatinine urine ratio -     losartan-hydrochlorothiazide (HYZAAR) 50-12.5 MG tablet; Take 1 tablet by mouth daily.  Mixed hyperlipidemia -     Hepatic function panel -     HTN_4 Lipid panel -     CK (Creatine Kinase) -     atorvastatin (LIPITOR) 40 MG tablet; Take 1 tablet (40 mg total) by mouth daily at 6 PM.   I have changed Cathy [name]'s atorvastatin. I am also having her maintain her Vitamin D, loratadine, Fingolimod HCl, triamcinolone cream, imipramine, diclofenac Sodium, dextroamphetamine, Janumet XR, and losartan-hydrochlorothiazide.  Meds ordered this encounter  Medications  . SitaGLIPtin-MetFORMIN HCl (JANUMET XR) [phone] MG TB24    Sig: Take 1 tablet by mouth daily after breakfast.    Dispense:  90 tablet    Refill:  1    Order Specific Question:   Supervising Provider    Answer:   Cathy [name] [[id number]]  . losartan-hydrochlorothiazide (HYZAAR) 50-12.5 MG tablet    Sig: Take 1 tablet by mouth daily.     Dispense:  90 tablet    Refill:  1    Order Specific Question:   Supervising Provider    Answer:   Cathy [name] [[id number]]  . atorvastatin (LIPITOR) 40 MG tablet    Sig: Take 1 tablet (40 mg total) by mouth daily at 6 PM.    Dispense:  90 tablet    Refill:  1    Order Specific Question:   Supervising Provider    Answer:   Cathy [name] [[id number]]    Problem List Items Addressed This Visit      Cardiovascular and Mediastinum   Essential (primary) hypertension   Relevant Medications   losartan-hydrochlorothiazide (HYZAAR) 50-12.5 MG tablet   atorvastatin (LIPITOR) 40 MG tablet   Other Relevant Orders   HTN_1 BMP (Completed)   TSH (Completed)   Microalbumin / creatinine urine ratio     Endocrine   Type 2 diabetes mellitus with diabetic polyneuropathy, without long-term current use of insulin (HCC) - Primary   Relevant Medications   SitaGLIPtin-MetFORMIN HCl (JANUMET XR) [phone] MG TB24   losartan-hydrochlorothiazide (HYZAAR) 50-12.5 MG tablet   atorvastatin (LIPITOR) 40 MG tablet   Other Relevant Orders   DM_1 Hemoglobin A1c (Completed)   HTN_1 BMP (Completed)   DM_eye   Microalbumin / creatinine urine ratio     Other   HLD (hyperlipidemia)   Relevant Medications   losartan-hydrochlorothiazide (HYZAAR) 50-12.5 MG tablet   atorvastatin (LIPITOR) 40 MG tablet   Other Relevant Orders   Hepatic function panel (Completed)   HTN_4 Lipid panel (Completed)   CK (Creatine Kinase) (Completed)   Vitamin D deficiency   Relevant Orders   Vitamin D 1,25 dihydroxy (Completed)      Follow-up: Return in about 4 weeks (around 03/11/2019) for CPE (fasting, F2F).  Alysia Penna, NP

## 2019-02-11 NOTE — Patient Instructions (Addendum)
You will be contacted to schedule appt with ophthalmology.  Normal CK. Vitamin D, lipid panel, thyroid, liver and renal function. DM controlled with HgbA1c of 7.9 Continue current medications  Chair exercise at home will be helpful in maintain muscle strength.  DASH Eating Plan DASH stands for "Dietary Approaches to Stop Hypertension." The DASH eating plan is a healthy eating plan that has been shown to reduce high blood pressure (hypertension). It may also reduce your risk for type 2 diabetes, heart disease, and stroke. The DASH eating plan may also help with weight loss. What are tips for following this plan?  General guidelines  Avoid eating more than 2,300 mg (milligrams) of salt (sodium) a day. If you have hypertension, you may need to reduce your sodium intake to 1,500 mg a day.  Limit alcohol intake to no more than 1 drink a day for nonpregnant women and 2 drinks a day for men. One drink equals 12 oz of beer, 5 oz of wine, or 1 oz of hard liquor.  Work with your health care provider to maintain a healthy body weight or to lose weight. Ask what an ideal weight is for you.  Get at least 30 minutes of exercise that causes your heart to beat faster (aerobic exercise) most days of the week. Activities may include walking, swimming, or biking.  Work with your health care provider or diet and nutrition specialist (dietitian) to adjust your eating plan to your individual calorie needs. Reading food labels   Check food labels for the amount of sodium per serving. Choose foods with less than 5 percent of the Daily Value of sodium. Generally, foods with less than 300 mg of sodium per serving fit into this eating plan.  To find whole grains, look for the word "whole" as the first word in the ingredient list. Shopping  Buy products labeled as "low-sodium" or "no salt added."  Buy fresh foods. Avoid canned foods and premade or frozen meals. Cooking  Avoid adding salt when cooking. Use  salt-free seasonings or herbs instead of table salt or sea salt. Check with your health care provider or pharmacist before using salt substitutes.  Do not fry foods. Cook foods using healthy methods such as baking, boiling, grilling, and broiling instead.  Cook with heart-healthy oils, such as olive, canola, soybean, or sunflower oil. Meal planning  Eat a balanced diet that includes: ? 5 or more servings of fruits and vegetables each day. At each meal, try to fill half of your plate with fruits and vegetables. ? Up to 6-8 servings of whole grains each day. ? Less than 6 oz of lean meat, poultry, or fish each day. A 3-oz serving of meat is about the same size as a deck of cards. One egg equals 1 oz. ? 2 servings of low-fat dairy each day. ? A serving of nuts, seeds, or beans 5 times each week. ? Heart-healthy fats. Healthy fats called Omega-3 fatty acids are found in foods such as flaxseeds and coldwater fish, like sardines, salmon, and mackerel.  Limit how much you eat of the following: ? Canned or prepackaged foods. ? Food that is high in trans fat, such as fried foods. ? Food that is high in saturated fat, such as fatty meat. ? Sweets, desserts, sugary drinks, and other foods with added sugar. ? Full-fat dairy products.  Do not salt foods before eating.  Try to eat at least 2 vegetarian meals each week.  Eat more home-cooked food and less  restaurant, buffet, and fast food.  When eating at a restaurant, ask that your food be prepared with less salt or no salt, if possible. What foods are recommended? The items listed may not be a complete list. Talk with your dietitian about what dietary choices are best for you. Grains Whole-grain or whole-wheat bread. Whole-grain or whole-wheat pasta. Brown rice. [place]row. Bulgur. Whole-grain and low-sodium cereals. Pita bread. Low-fat, low-sodium crackers. Whole-wheat flour tortillas. Vegetables Fresh or frozen vegetables (raw, steamed,  roasted, or grilled). Low-sodium or reduced-sodium tomato and vegetable juice. Low-sodium or reduced-sodium tomato sauce and tomato paste. Low-sodium or reduced-sodium canned vegetables. Fruits All fresh, dried, or frozen fruit. Canned fruit in natural juice (without added sugar). Meat and other protein foods Skinless chicken or [place]. Ground chicken or [place]. Pork with fat trimmed off. Fish and seafood. Egg whites. Dried beans, peas, or lentils. Unsalted nuts, nut butters, and seeds. Unsalted canned beans. Lean cuts of beef with fat trimmed off. Low-sodium, lean deli meat. Dairy Low-fat (1%) or fat-free (skim) milk. Fat-free, low-fat, or reduced-fat cheeses. Nonfat, low-sodium ricotta or cottage cheese. Low-fat or nonfat yogurt. Low-fat, low-sodium cheese. Fats and oils Soft margarine without trans fats. Vegetable oil. Low-fat, reduced-fat, or light mayonnaise and salad dressings (reduced-sodium). Canola, safflower, olive, soybean, and sunflower oils. Avocado. Seasoning and other foods Herbs. Spices. Seasoning mixes without salt. Unsalted popcorn and pretzels. Fat-free sweets. What foods are not recommended? The items listed may not be a complete list. Talk with your dietitian about what dietary choices are best for you. Grains Baked goods made with fat, such as croissants, muffins, or some breads. Dry pasta or rice meal packs. Vegetables Creamed or fried vegetables. Vegetables in a cheese sauce. Regular canned vegetables (not low-sodium or reduced-sodium). Regular canned tomato sauce and paste (not low-sodium or reduced-sodium). Regular tomato and vegetable juice (not low-sodium or reduced-sodium). Angie Fava. Olives. Fruits Canned fruit in a light or heavy syrup. Fried fruit. Fruit in cream or butter sauce. Meat and other protein foods Fatty cuts of meat. Ribs. Fried meat. Berniece Salines. Sausage. Bologna and other processed lunch meats. Salami. Fatback. Hotdogs. Bratwurst. Salted nuts and seeds. Canned  beans with added salt. Canned or smoked fish. Whole eggs or egg yolks. Chicken or [place] with skin. Dairy Whole or 2% milk, cream, and half-and-half. Whole or full-fat cream cheese. Whole-fat or sweetened yogurt. Full-fat cheese. Nondairy creamers. Whipped toppings. Processed cheese and cheese spreads. Fats and oils Butter. Stick margarine. Lard. Shortening. Ghee. Bacon fat. Tropical oils, such as coconut, palm kernel, or palm oil. Seasoning and other foods Salted popcorn and pretzels. Onion salt, garlic salt, seasoned salt, table salt, and sea salt. Worcestershire sauce. Tartar sauce. Barbecue sauce. Teriyaki sauce. Soy sauce, including reduced-sodium. Steak sauce. Canned and packaged gravies. Fish sauce. Oyster sauce. Cocktail sauce. Horseradish that you find on the shelf. Ketchup. Mustard. Meat flavorings and tenderizers. Bouillon cubes. Hot sauce and Tabasco sauce. Premade or packaged marinades. Premade or packaged taco seasonings. Relishes. Regular salad dressings. Where to find more information:  National Heart, Lung, and [place]: https://wilson-eaton.com/  American Heart Association: www.heart.org Summary  The DASH eating plan is a healthy eating plan that has been shown to reduce high blood pressure (hypertension). It may also reduce your risk for type 2 diabetes, heart disease, and stroke.  With the DASH eating plan, you should limit salt (sodium) intake to 2,300 mg a day. If you have hypertension, you may need to reduce your sodium intake to 1,500 mg a day.  When on the DASH eating plan, aim to eat more fresh fruits and vegetables, whole grains, lean proteins, low-fat dairy, and heart-healthy fats.  Work with your health care provider or diet and nutrition specialist (dietitian) to adjust your eating plan to your individual calorie needs. This information is not intended to replace advice given to you by your health care provider. Make sure you discuss any questions you have with your  health care provider. Document Revised: 12/23/2016 Document Reviewed: 01/04/2016 Elsevier Patient Education  2020 [employer].

## 2019-02-14 ENCOUNTER — Encounter: Payer: Self-pay | Admitting: Nurse Practitioner

## 2019-02-14 LAB — VITAMIN D 1,25 DIHYDROXY
Vitamin D 1, 25 (OH)2 Total: 73 pg/mL — ABNORMAL HIGH (ref 18–72)
Vitamin D2 1, 25 (OH)2: <8 pg/mL
Vitamin D3 1, 25 (OH)2: 73 pg/mL

## 2019-02-14 MED ORDER — LOSARTAN POTASSIUM-HCTZ 50-12.5 MG PO TABS: 1.0000 | ORAL_TABLET | Freq: Every day | ORAL | 1 refills | Status: DC

## 2019-02-14 MED ORDER — ATORVASTATIN CALCIUM 40 MG PO TABS: 40.0000 mg | ORAL_TABLET | Freq: Every day | ORAL | 1 refills | Status: DC

## 2019-02-14 MED ORDER — JANUMET XR 100-1000 MG PO TB24: 1.0000 | ORAL_TABLET | Freq: Every day | ORAL | 1 refills | Status: DC

## 2019-02-14 NOTE — Assessment & Plan Note (Signed)
Controlled with hgbA1c of 7.9 Continue Janumet. Pending urine microalbumin LDL at goal with atorvastatin BP at goal with losartan/HCTZ Entered referral to ophthalmology Stable neuropathy (due to DM and MS) Advised about importance of diet compliance. F/up in 76months

## 2019-02-19 DIAGNOSIS — Z4789 Encounter for other orthopedic aftercare: Secondary | ICD-10-CM | POA: Diagnosis not present

## 2019-03-04 ENCOUNTER — Other Ambulatory Visit: Payer: Self-pay | Admitting: Neurology

## 2019-03-04 MED ORDER — DEXTROAMPHETAMINE SULFATE ER 10 MG PO CP24: 20.0000 mg | ORAL_CAPSULE | Freq: Every day | ORAL | 0 refills | Status: DC

## 2019-03-04 NOTE — Addendum Note (Signed)
Addended by: Hillis Range on: 03/04/2019 09:34 AM   Modules accepted: Orders

## 2019-03-04 NOTE — Telephone Encounter (Signed)
Pt has called for a refill on her dextroamphetamine (DEXEDRINE) 10 MG 24 hr capsule [facility] [phone] -

## 2019-03-06 ENCOUNTER — Other Ambulatory Visit: Payer: Self-pay | Admitting: *Deleted

## 2019-03-06 ENCOUNTER — Telehealth: Payer: Self-pay | Admitting: Neurology

## 2019-03-06 DIAGNOSIS — G35 Multiple sclerosis: Secondary | ICD-10-CM

## 2019-03-06 MED ORDER — GILENYA 0.5 MG PO CAPS: ORAL_CAPSULE | ORAL | 3 refills | Status: DC

## 2019-03-06 NOTE — Telephone Encounter (Signed)
I called pt back and advised we sent in refill this morning for Gilenya. She verbalized understanding. Nothing further needed.

## 2019-03-06 NOTE — Telephone Encounter (Signed)
Pt has called to inform that she was informed by her specialty pharmacy thru [facility] that Dr Epimenio Foot needs to approve/authorize her Fingolimod HCl (GILENYA) 0.5 MG CAPS.  Please call

## 2019-03-11 ENCOUNTER — Encounter: Payer: Federal, State, Local not specified - PPO | Admitting: Nurse Practitioner

## 2019-03-13 ENCOUNTER — Telehealth: Payer: Self-pay | Admitting: Neurology

## 2019-03-13 NOTE — Telephone Encounter (Signed)
I called patient and LVM regarding rescheduling 2/18 appointment due to inclement weather. Requested patient call back to reschedule. 

## 2019-03-14 ENCOUNTER — Ambulatory Visit: Payer: Federal, State, Local not specified - PPO | Admitting: Neurology

## 2019-03-18 ENCOUNTER — Telehealth: Payer: Self-pay | Admitting: Neurology

## 2019-03-18 DIAGNOSIS — Z0289 Encounter for other administrative examinations: Secondary | ICD-10-CM

## 2019-03-18 NOTE — Telephone Encounter (Signed)
I called pt. She has same insurance as last year. Advised I will try and attempt proactive PA. Some insurances allow this, some do not. I will call her back if anything needed. She verbalized understanding.

## 2019-03-18 NOTE — Telephone Encounter (Signed)
Pt called and LVM wanting to know if the RN can call in a pre auth for her Fingolimod HCl (GILENYA) 0.5 MG CAPS Pt states the pre auth she has now expires on 05/13/19 and she received a call informing her that she is needing a new on. Please advise.

## 2019-03-19 NOTE — Telephone Encounter (Signed)
Initiated PA on CMM. Key: [id number]. Waiting on clinical questions to become available to answer.

## 2019-03-20 ENCOUNTER — Telehealth: Payer: Self-pay | Admitting: *Deleted

## 2019-03-20 NOTE — Telephone Encounter (Signed)
Received FMLA forms. I called pt. She is still working full time from home. Some days fatigue is severe secondary to MS and she would like FMLA for these times. MD approved 3x/month, 1 day/episode. Gave completed/signed forms back to medical records to process for pt.  She reports she had L arm surgery d/t torn tendon on 02/08/19.

## 2019-03-21 NOTE — Telephone Encounter (Signed)
Per CMM, we have to call FEP to complete PA. Called and spoke with [place] with FEP at [phone]. Pt has been on Gilenya since 2013. Tried/failed: copaxone. Dx: G35, MS.  PA approved 02/19/2019-03/20/20. They will fax approval letter to [place] at [phone].

## 2019-03-26 ENCOUNTER — Encounter: Payer: Self-pay | Admitting: Neurology

## 2019-03-26 ENCOUNTER — Ambulatory Visit: Payer: Federal, State, Local not specified - PPO | Admitting: Neurology

## 2019-03-26 ENCOUNTER — Other Ambulatory Visit: Payer: Self-pay

## 2019-03-26 VITALS — BP 157/82 | HR 101 | Temp 97.0°F | Ht 65.5 in | Wt 225.5 lb

## 2019-03-26 DIAGNOSIS — F988 Other specified behavioral and emotional disorders with onset usually occurring in childhood and adolescence: Secondary | ICD-10-CM | POA: Diagnosis not present

## 2019-03-26 DIAGNOSIS — G35 Multiple sclerosis: Secondary | ICD-10-CM | POA: Diagnosis not present

## 2019-03-26 DIAGNOSIS — R208 Other disturbances of skin sensation: Secondary | ICD-10-CM | POA: Diagnosis not present

## 2019-03-26 DIAGNOSIS — Z79899 Other long term (current) drug therapy: Secondary | ICD-10-CM | POA: Diagnosis not present

## 2019-03-26 DIAGNOSIS — R5383 Other fatigue: Secondary | ICD-10-CM

## 2019-03-26 MED ORDER — DEXTROAMPHETAMINE SULFATE ER 10 MG PO CP24: 20.0000 mg | ORAL_CAPSULE | Freq: Every day | ORAL | 0 refills | Status: DC

## 2019-03-26 MED ORDER — GILENYA 0.5 MG PO CAPS: ORAL_CAPSULE | ORAL | 3 refills | Status: DC

## 2019-03-26 NOTE — Progress Notes (Signed)
[facility]  PATIENT: Cathy [name] DOB: September 10, 1965  REFERRING DOCTOR OR PCP:  Tiana Loft  SOURCE: patient and medical records available  _________________________________   HISTORICAL  CHIEF COMPLAINT:  Chief Complaint  Patient presents with  . Follow-up    RM 12, alone. Last seen 09/12/2018  . Multiple Sclerosis    On Gilenya. Takes dexedrine for fatigue.     HISTORY OF PRESENT ILLNESS:  Cathy [name] is a 54 y.o. woman with MS noting more gait issues.  Update 03/26/2019: She is generally doing well but notes her short term memory is doing worse.    She is on Gilenya and tolerates it well.   She has no exacerbations recently.  Her last MRIs were stable.  She is walking well.   She has no falls but sometimes the right lg seems slightly weak.    She no longer is having any dysesthesias in her hands.   Bladder functon is fine.    She notes some vision issues up close.       She has fatigue.    She feels better with dexedrine.    She has insomnia some nights.   She notes reduced STM and attnention/focus.  She is on dextroamphetamine 10 mg 4 times a day.      Update 09/12/18: She is on Gilenya and tolerates it well.    She has no definite exacerbation but has noted more tingling in her hands, uncomfortable but not painful at times.  Quality is pins/nedles like it fell asleep.   Symptoms are mostly in the forearm and dorsum of the wrist.    The episodes last just a couple minutes but they occur multiple times a day.   She does not note numbness.    She notes no change in coordination or strength.   She has older mild left arm and right leg weakness.    She notes no change in her bladder.  Vision is slightly worse but no major or asymmetric change.   She has not seen ophthalmology recently.  .  She notes fatigue, helped by dextroamphetamine.   She has insomnia due to difficulty falling asleep and sleep maintenance issues.   Mood is fine.   She has had some  decreased focus/atention, helped by Dexedrine.   She needs to write things down some.     MRI cervical spine 06/27/18 showed "There are several T2 hyperintense foci within the spinal cord.  These are located posteriorly adjacent to C2-C3, laterally to the left adjacent to C2-C3, laterally to the right adjacent to C6 and posterolaterally to the right adjacent to C7.  The foci were present on the 01/29/2016 ".  Also mild spinal stenosis at [facility] and C6C7.  She has C4-C6 fusion.  MRI Brain 06/27/18:   "There are T2/flair hyperintense foci in the hemispheres, left thalamus, left cerebellum and spinal cord in a pattern and configuration consistent with chronic demyelinating plaque associated with multiple sclerosis.  None of the foci appears to be acute.  Compared to the MRI dated 06/07/2017, there is no interim change."  Update 06/20/2018 (Virtual): She feels her MS is stable.  She is on Gilenya and she tolerates it well.  We did have a discussion about MS and Gilenya going the COVID-19 pandemic.  The MS itself is probably just a slight risk.  However, Gilenya has been associated with some infections and it is possible that it can lead to a higher risk of COVID-19.  She  is socially distancing.  She denies any new MS symptoms or exacerbations.  She continues to have mild issues with her gait with some reduced balance.  Sometimes the right leg gives out.  She notes mildly reduced vision OD since her optic neuritis.  It is stable.   Bladder function is good.     Fatigue is better on Dexedrine 40 mg qAM.  She has some insomnia.  Melatonin 5 mg only helps some.  She denies any difficulties with mood.  She is still having pain in the right knee since she fell early March 2020.   She was on meloxicam but it had not helped much.   She could not tolerate meloxicam.  Pain is worse after she walks more and when she gets out of bed.   Her knee was swollen at first.   She denies weakness or numbness.       Update  12/20/2017: She is on Gilenya as her DMT and tolerates it well.   No definite exacerbations,.   However, she notes that the right eye vision seems worse.   She notes this especially in dim light.     She had optic neuritis in the past on the right and does note reduced contrast and color vision during the day which is chronic.    Her gait is doing well   She denies any falls.   Her right leg seems to limp more.   Gait is worse when tired or hot.   She has some RLS symptoms in the right leg and it seems to cramp up some.   Moving helps. She has some insomnia due to the leg discomfort.   Gabapentin had a hangover in the past.   She had not tried baclofen or Ropinirole in the past.   Bladder is doing well.  She notes fatigue and some reduced focus/attnetion.     Dexedrine helps her fatigue and attnetion.  However, she still finds learning to be difficult.    Mood is doing well.     Update 05/30/2017: She feels that she has been fairly stable.  She notes no exacerbations or other new MS symptoms.  She is on Gilenya and she tolerates it well.  Her gait is baseline with mildly reduced balance.  She has occasional stumbling but no falls.  She does not note any major problems with weakness or numbness.  She has some urinary urgency and frequency and rare incontinence.  She notes occasioanl constipation and has also had runny stools with 2 episodes of fecal incontinence.   Vision is fine.  Continues to report some fatigue that is both physical and cognitive.  She does better with Dexedrine and she tolerates it well.  She also has had some difficulty with focus and attention and that is also improved by the Dexedrine.   She denies any problems with mood.   She has some insomnia but notes that if she drinks a beer at bedtime she falls asleep better.       She notes sharp pains in her temple lasting 1 second at times.    Sometimes she notes pain in her back when she takes a deep breath.  Update 11/29/2016:   She feels  her MS is stable.   No new numbness, weakness or gait issues.   Balance is still a little off but no falls.    Occasionally, one of her knees will give out but she does not fall.    Bladder  is doing well with mild frequency.   Vision is doing ok.     She feels her fatigue is less of a problem on dextroamphetamine.    It also helps her focus and attention.    She denies depression.    She has insomnia (sleep maintenance > sleep onset).    She takes melatonin.  She sometimes feels that she wakes up but can't move.  She does not have much daytime sleepiness.     She occasionally gets sharp pain in her temple lasting seconds (not sure if left or right)..  It occurs less than once a week.   From 01/06/2016:    MS:   She has been on Gilenya since 2013 and is tolerating it well.  She denies any new numbness or weakness in the right arm or either leg.   MRI brain 01/22/2015 showed no acute findings.      Gait/strength/sensation:   Her right leg is dragging a more and it is more noticeable.   She stumbles and has had a few falls.  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    She gets a [employer] sensation in both legs.  Bladder:  She is doing well with minimal frequency.  She has nocturia once a night  Vision:   She feels this is stable.  However,  she had right optic neuritis in the past and still notes right color desaturation and slightly reduced visual acuity.   No eye pain or diplopia.     Mood/cognition:    She denies any depression or anxiety. She notes cognitive issues, especially attention deficit, mild problems with verbal fluency and processing speed. She notes that cognitive problems have improved on the Dexedrine.has   Fatigue/sleep: She notes fatigue,  worse as the day goes on and with heat. She has a lot of benefit with Dexedrine 20 mg twice a day.   Sometimes, however, she gets much more tired in the evenings. She falls aslee well at  night now but had insomnia last year.   Shoulder:    Her shoulder is much better since the shoulder injection at the last visit.    MS History:   She was diagnosed with MS after presenting with optic neuritis x 2 in 2009.   She had an MRI and LP and was diagnosed with MS.     She started on Copaxone and switched to Gilenya in 2013.    She denies any further exacerbation since the optic neuritis but was told she had MS plaques on her spine when she presented last year with a cervical radiculopathy requiring surgery.    I have seen her the past year at [facility].   Her last MRI's were Jan 21, 2014.  MRI images show foci in the cerebellum and the hemispheres consistent with MS. There were no acute findings. She has NIDDM, HTN and high cholesterol.    REVIEW OF SYSTEMS: Constitutional: No fevers, chills, sweats, or change in appetite.   Has fatigue Eyes: H/o right ON with residual color desaturation Ear, nose and throat: No hearing loss, ear pain, nasal congestion, sore throat Cardiovascular: No chest pain, palpitations Respiratory: No shortness of breath at rest or with exertion.   No wheezes GastrointestinaI: No nausea, vomiting, diarrhea, abdominal pain, fecal incontinence Genitourinary: No dysuria, urinary retention or frequency.  No nocturia. Musculoskeletal: No neck pain, back pain,. Notes left shoulder pain. Integumentary: No  rash, pruritus, skin lesions Neurological: as above Psychiatric: No depression at this time.  No anxiety Endocrine: has NIDDM.  No palpitations, diaphoresis, change in appetite, change in weigh or increased thirst Hematologic/Lymphatic: No anemia, purpura, petechiae. Allergic/Immunologic: No itchy/runny eyes, nasal congestion, recent allergic reactions, rashes  ALLERGIES: Allergies  Allergen Reactions  . Aspirin     Heartburn  . Ibuprofen     heatburn    HOME MEDICATIONS:  Current Outpatient Medications:  .  atorvastatin (LIPITOR) 40 MG  tablet, Take 1 tablet (40 mg total) by mouth daily at 6 PM., Disp: 90 tablet, Rfl: 1 .  Cholecalciferol (VITAMIN D) 2000 UNITS CAPS, Take 2,000 Units by mouth daily., Disp: , Rfl:  .  dextroamphetamine (DEXEDRINE) 10 MG 24 hr capsule, Take 2-4 capsules (20-40 mg total) by mouth daily., Disp: 120 capsule, Rfl: 0 .  diclofenac Sodium (VOLTAREN) 1 % GEL, Apply topically., Disp: , Rfl:  .  Fingolimod HCl (GILENYA) 0.5 MG CAPS, TAKE ONE CAPSULE (0.5 MG) BY MOUTH EACH DAY. MAY TAKE WITH OR WITHOUTFOOD. STORE AT ROOM TEMPERATURE., Disp: 90 capsule, Rfl: 3 .  imipramine (TOFRANIL) 25 MG tablet, Take 1 tablet (25 mg total) by mouth at bedtime., Disp: 30 tablet, Rfl: 5 .  loratadine (CLARITIN) 10 MG tablet, Take 10 mg by mouth daily., Disp: , Rfl:  .  losartan-hydrochlorothiazide (HYZAAR) 50-12.5 MG tablet, Take 1 tablet by mouth daily., Disp: 90 tablet, Rfl: 1 .  SitaGLIPtin-MetFORMIN HCl (JANUMET XR) [phone] MG TB24, Take 1 tablet by mouth daily after breakfast., Disp: 90 tablet, Rfl: 1 .  triamcinolone cream (KENALOG) 0.1 %, Apply topically 3 (three) times daily., Disp: 45 g, Rfl: 1 No current facility-administered medications for this visit.  Facility-Administered Medications Ordered in Other Visits:  .  gadopentetate dimeglumine (MAGNEVIST) injection 20 mL, 20 mL, Intravenous, Once PRN, Endi Lagman, Pearletha Furl, MD  PAST MEDICAL HISTORY: Past Medical History:  Diagnosis Date  . Diabetes mellitus without complication (HCC)   . Eczema   . Hypertension   . Multiple sclerosis (HCC)   . Vision abnormalities     PAST SURGICAL HISTORY: Past Surgical History:  Procedure Laterality Date  . ABDOMINAL HYSTERECTOMY    . CARPAL TUNNEL RELEASE    . CERVICAL SPINE SURGERY     Dr. Sharolyn Douglas  . LASER ABLATION    . TUBAL LIGATION      FAMILY HISTORY: Family History  Problem Relation Age of Onset  . Hypertension Mother   . Diabetes type II Mother   . COPD Father   . Multiple sclerosis Paternal Uncle   .  Multiple sclerosis Paternal Grandfather     SOCIAL HISTORY:  Social History   Socioeconomic History  . Marital status: Married    Spouse name: Anothony  . Number of children: 3  . Years of education: 12+  . Highest education level: Not on file  Occupational History  . Not on file  Tobacco Use  . Smoking status: Never Smoker  . Smokeless tobacco: Never Used  Substance and Sexual Activity  . Alcohol use: Yes    Alcohol/week: 1.0 standard drinks    Types: 1 Glasses of wine per week    Comment: occasionally  . Drug use: No  . Sexual activity: Yes    Birth control/protection: Surgical  Other Topics Concern  . Not on file  Social History Narrative   Right handed    Caffeine ACZ:YSAY sometimes.   Lives with husband,  Ethelene Browns   Social Determinants of  Health   Financial Resource Strain:   . Difficulty of Paying Living Expenses: Not on file  Food Insecurity:   . Worried About Programme researcher, broadcasting/film/video in the Last Year: Not on file  . Ran Out of Food in the Last Year: Not on file  Transportation Needs:   . Lack of Transportation (Medical): Not on file  . Lack of Transportation (Non-Medical): Not on file  Physical Activity:   . Days of Exercise per Week: Not on file  . Minutes of Exercise per Session: Not on file  Stress:   . Feeling of Stress : Not on file  Social Connections:   . Frequency of Communication with Friends and Family: Not on file  . Frequency of Social Gatherings with Friends and Family: Not on file  . Attends Religious Services: Not on file  . Active Member of Clubs or Organizations: Not on file  . Attends Banker Meetings: Not on file  . Marital Status: Not on file  Intimate Partner Violence:   . Fear of Current or Ex-Partner: Not on file  . Emotionally Abused: Not on file  . Physically Abused: Not on file  . Sexually Abused: Not on file     PHYSICAL EXAM  Vitals:   03/26/19 1612  BP: (!) 157/82  Pulse: (!) 101  Temp: (!) 97 F (36.1  C)  Weight: 225 lb 8 oz (102.3 kg)  Height: 5' 5.5" (1.664 m)    Body mass index is 36.95 kg/m.   General: The patient is well-developed and well-nourished and in no acute distress   Neurologic Exam  Mental status: The patient is alert and oriented x 3 at the time of the examination. The patient has apparent normal recent and remote memory, with an apparently normal attention span and concentration ability.   Speech is normal.  Cranial nerves: Extraocular movements are full.  Facial strength and sensation was normal.  Trapezius strength is normal.     No obvious hearing deficits are noted.  Motor:  Muscle bulk is normal.   Muscle tone is mildly increased in the right leg.  Strength is 5/5. Sensory: Sensory testing is intact to soft touch and vibration sensation in all 4 extremities.  Coordination: Cerebellar testing reveals good finger-nose-finger and reduced right heel to shin.  Gait and station: Station is normal.   The gait is fairly normal but the tandem gait is wide.  Romberg is negative.  Reflexes: Deep tendon reflexes are symmetric and normal bilaterally.       1. Dysesthesia   2. Multiple sclerosis (HCC)   3. High risk medication use   4. Attention deficit disorder, unspecified hyperactivity presence   5. Other fatigue     1.   Continue Gilenya for MS.  We will check blood work today.   2.   Continue Dexedrine for fatigue and attention deficit. 3.  Stay active and exercise as tolerated. 4.   Return in 6 months or sooner if there are new or worsening neurologic symptoms.   Jaheem Hedgepath A. Epimenio Foot, MD, PhD 03/26/2019, 5:26 PM Certified in Neurology, Clinical Neurophysiology, Sleep Medicine, Pain Medicine and Neuroimaging  [facility] [address] [place], [place] [address] [phone] -

## 2019-03-27 ENCOUNTER — Telehealth: Payer: Self-pay | Admitting: *Deleted

## 2019-03-27 LAB — CBC WITH DIFFERENTIAL/PLATELET
Basophils Absolute: 0 10*3/uL (ref 0.0–0.2)
Basos: 1 %
EOS (ABSOLUTE): 0.1 10*3/uL (ref 0.0–0.4)
Eos: 2 %
Hematocrit: 37.2 % (ref 34.0–46.6)
Hemoglobin: 12.8 g/dL (ref 11.1–15.9)
Immature Grans (Abs): 0 10*3/uL (ref 0.0–0.1)
Immature Granulocytes: 0 %
Lymphocytes Absolute: 0.5 10*3/uL — ABNORMAL LOW (ref 0.7–3.1)
Lymphs: 9 %
MCH: 29.4 pg (ref 26.6–33.0)
MCHC: 34.4 g/dL (ref 31.5–35.7)
MCV: 85 fL (ref 79–97)
Monocytes Absolute: 0.5 10*3/uL (ref 0.1–0.9)
Monocytes: 9 %
Neutrophils Absolute: 4.4 10*3/uL (ref 1.4–7.0)
Neutrophils: 79 %
Platelets: 371 10*3/uL (ref 150–450)
RBC: 4.36 x10E6/uL (ref 3.77–5.28)
RDW: 13.2 % (ref 11.7–15.4)
WBC: 5.5 10*3/uL (ref 3.4–10.8)

## 2019-03-27 NOTE — Telephone Encounter (Signed)
Called and spoke with pt about lab results per Dr. Sater note. She verbalized understanding.  

## 2019-03-27 NOTE — Telephone Encounter (Signed)
-----   Message from Asa Lente, MD sent at 03/27/2019  8:41 AM EST ----- Please let the patient know that the lab work is fine.    Mildly low lymphocytes are expected with Gilenya

## 2019-04-03 ENCOUNTER — Other Ambulatory Visit: Payer: Self-pay

## 2019-04-05 ENCOUNTER — Other Ambulatory Visit (HOSPITAL_COMMUNITY)
Admission: RE | Admit: 2019-04-05 | Discharge: 2019-04-05 | Disposition: A | Discharge status: Home / Self Care | Payer: Federal, State, Local not specified - PPO | Source: Ambulatory Visit | Attending: Nurse Practitioner | Admitting: Nurse Practitioner

## 2019-04-05 ENCOUNTER — Other Ambulatory Visit: Payer: Self-pay

## 2019-04-05 ENCOUNTER — Encounter: Payer: Self-pay | Admitting: Nurse Practitioner

## 2019-04-05 ENCOUNTER — Ambulatory Visit (INDEPENDENT_AMBULATORY_CARE_PROVIDER_SITE_OTHER): Payer: Federal, State, Local not specified - PPO | Admitting: Nurse Practitioner

## 2019-04-05 VITALS — BP 118/80 | HR 107 | Temp 97.0°F | Ht 65.0 in | Wt 225.6 lb

## 2019-04-05 DIAGNOSIS — E1142 Type 2 diabetes mellitus with diabetic polyneuropathy: Secondary | ICD-10-CM | POA: Diagnosis not present

## 2019-04-05 DIAGNOSIS — I10 Essential (primary) hypertension: Secondary | ICD-10-CM

## 2019-04-05 DIAGNOSIS — Z0001 Encounter for general adult medical examination with abnormal findings: Secondary | ICD-10-CM | POA: Insufficient documentation

## 2019-04-05 DIAGNOSIS — Z8371 Family history of colonic polyps: Secondary | ICD-10-CM

## 2019-04-05 DIAGNOSIS — H25812 Combined forms of age-related cataract, left eye: Secondary | ICD-10-CM | POA: Diagnosis not present

## 2019-04-05 DIAGNOSIS — Z1231 Encounter for screening mammogram for malignant neoplasm of breast: Secondary | ICD-10-CM

## 2019-04-05 DIAGNOSIS — Z124 Encounter for screening for malignant neoplasm of cervix: Secondary | ICD-10-CM

## 2019-04-05 DIAGNOSIS — E119 Type 2 diabetes mellitus without complications: Secondary | ICD-10-CM | POA: Diagnosis not present

## 2019-04-05 DIAGNOSIS — Z1211 Encounter for screening for malignant neoplasm of colon: Secondary | ICD-10-CM | POA: Diagnosis not present

## 2019-04-05 DIAGNOSIS — Z83719 Family history of colon polyps, unspecified: Secondary | ICD-10-CM

## 2019-04-05 DIAGNOSIS — G35 Multiple sclerosis: Secondary | ICD-10-CM

## 2019-04-05 DIAGNOSIS — Z79899 Other long term (current) drug therapy: Secondary | ICD-10-CM | POA: Diagnosis not present

## 2019-04-05 DIAGNOSIS — E782 Mixed hyperlipidemia: Secondary | ICD-10-CM | POA: Diagnosis not present

## 2019-04-05 DIAGNOSIS — Z0000 Encounter for general adult medical examination without abnormal findings: Secondary | ICD-10-CM

## 2019-04-05 DIAGNOSIS — R269 Unspecified abnormalities of gait and mobility: Secondary | ICD-10-CM

## 2019-04-05 DIAGNOSIS — H04123 Dry eye syndrome of bilateral lacrimal glands: Secondary | ICD-10-CM | POA: Diagnosis not present

## 2019-04-05 DIAGNOSIS — H2511 Age-related nuclear cataract, right eye: Secondary | ICD-10-CM | POA: Diagnosis not present

## 2019-04-05 LAB — CBC WITH DIFFERENTIAL/PLATELET
Basophils Absolute: 0 10*3/uL (ref 0.0–0.1)
Basophils Relative: 0.7 % (ref 0.0–3.0)
Eosinophils Absolute: 0.1 10*3/uL (ref 0.0–0.7)
Eosinophils Relative: 2.4 % (ref 0.0–5.0)
HCT: 36.6 % (ref 36.0–46.0)
Hemoglobin: 12.5 g/dL (ref 12.0–15.0)
Lymphocytes Relative: 20.4 % (ref 12.0–46.0)
Lymphs Abs: 0.8 10*3/uL (ref 0.7–4.0)
MCHC: 34 g/dL (ref 30.0–36.0)
MCV: 87.1 fl (ref 78.0–100.0)
Monocytes Absolute: 0.4 10*3/uL (ref 0.1–1.0)
Monocytes Relative: 9.7 % (ref 3.0–12.0)
Neutro Abs: 2.6 10*3/uL (ref 1.4–7.7)
Neutrophils Relative %: 66.8 % (ref 43.0–77.0)
Platelets: 306 10*3/uL (ref 150.0–400.0)
RBC: 4.21 Mil/uL (ref 3.87–5.11)
RDW: 14.2 % (ref 11.5–15.5)
WBC: 3.9 10*3/uL — ABNORMAL LOW (ref 4.0–10.5)

## 2019-04-05 LAB — HEMOGLOBIN A1C: Hgb A1c MFr Bld: 9.4 % — ABNORMAL HIGH (ref 4.6–6.5)

## 2019-04-05 LAB — MICROALBUMIN / CREATININE URINE RATIO
Creatinine,U: 107.4 mg/dL
Microalb Creat Ratio: 1.1 mg/g (ref 0.0–30.0)
Microalb, Ur: 1.2 mg/dL (ref 0.0–1.9)

## 2019-04-05 LAB — HM DIABETES EYE EXAM

## 2019-04-05 MED ORDER — JANUMET XR 100-1000 MG PO TB24: 1.0000 | ORAL_TABLET | Freq: Every day | ORAL | 1 refills | Status: DC

## 2019-04-05 NOTE — Progress Notes (Signed)
Subjective:    Patient ID: Cathy [name], female    DOB: [birth year], 54 y.o.   MRN: [id number]  Patient presents today for complete physical and eval of DM  HPI  DM, HTN, and Hyperlipidemia: uncontrolled Reports home glucose 150s-200s Last hgbA1c at 7.9. She discontinued janumet. BP at goal with hyzaar  LDL at goal with atorvastatin Needs urine microalbumin collected.  Sexual History (orientation,birth control, marital status, STD): sexually active, s/p tubal ligation and hysterectomy, last PAP 2014(normal)  Depression/Suicide: Depression screen [facility] 2/9 04/05/2019 02/11/2019 12/12/2018  Decreased Interest 0 0 0  Down, Depressed, Hopeless 0 0 0  PHQ - 2 Score 0 0 0   Vision:appt scheduled today  Dental:up to date  Immunizations: (TDAP, Hep C screen, Pneumovax, Influenza, zoster)  Health Maintenance  Topic Date Due  . Eye exam for diabetics  Never done  . Pap Smear  08/03/2012  . Mammogram  12/01/2015  . Colon Cancer Screening  Never done  . Tetanus Vaccine  04/04/2020*  . HIV Screening  04/04/2020*  . Hemoglobin A1C  10/06/2019  . Complete foot exam   02/11/2020  . Flu Shot  Completed  . Pneumococcal vaccine  Completed  *Topic was postponed. The date shown is not the original due date.   Diet:regular.  Weight:  Wt Readings from Last 3 Encounters:  04/05/19 225 lb 9.6 oz (102.3 kg)  03/26/19 225 lb 8 oz (102.3 kg)  02/11/19 225 lb 11.2 oz (102.4 kg)   Exercise:none, has difficulty with unsteady gait due to MS and neuropathy. She is interested in PT for gait training and strengthening  Fall Risk: Fall Risk  04/05/2019 02/11/2019 12/12/2018  Falls in the past year? 0 1 1  Number falls in past yr: 0 0 0  Injury with Fall? 0 1 1  Comment - knee painful arm pain   Medications and allergies reviewed with patient and updated if appropriate.  Patient Active Problem List   Diagnosis Date Noted  . Primary osteoarthritis of right knee 07/15/2018  . Right knee  pain 06/20/2018  . Ganglion cyst of dorsum of left wrist 03/14/2017  . Leukopenia 12/29/2016  . Dysesthesia 01/06/2016  . Left shoulder pain 10/06/2015  . Gait disturbance 09/03/2015  . High risk medication use 05/21/2014  . Vitamin D deficiency 05/21/2014  . Other fatigue 05/21/2014  . Disturbed cognition 05/21/2014  . Attention deficit disorder 05/21/2014  . Cervical spinal stenosis 11/29/2013  . Insomnia 08/22/2013  . Cervical disc disease with myelopathy 08/02/2013  . Cervical nerve root disorder 07/29/2013  . Essential (primary) hypertension 05/27/2013  . HLD (hyperlipidemia) 05/01/2013  . Diabetes mellitus, type 2 (HCC) 05/01/2013  . Type 2 or unspecified type diabetes mellitus 05/01/2013  . DS (disseminated sclerosis) (HCC) 04/30/2013  . Lung nodule, solitary 04/30/2013  . CHEST PAIN UNSPECIFIED 10/06/2009  . ANEMIA-NOS 08/03/2009  . Multiple sclerosis (HCC) 08/03/2009  . Optic neuritis, right 08/03/2009  . ALLERGIC RHINITIS, SEASONAL 08/03/2009  . ASTHMA 08/03/2009  . Hay fever 08/03/2009  . History of disorder of central nervous system 08/03/2009  . Mild intermittent asthma without complication 08/03/2009    Current Outpatient Medications on File Prior to Visit  Medication Sig Dispense Refill  . atorvastatin (LIPITOR) 40 MG tablet Take 1 tablet (40 mg total) by mouth daily at 6 PM. 90 tablet 1  . Cholecalciferol (VITAMIN D) 2000 UNITS CAPS Take 2,000 Units by mouth daily.    Marland Kitchen dextroamphetamine (DEXEDRINE) 10 MG 24 hr capsule Take 2-4  capsules (20-40 mg total) by mouth daily. 120 capsule 0  . diclofenac Sodium (VOLTAREN) 1 % GEL Apply topically.    . Fingolimod HCl (GILENYA) 0.5 MG CAPS TAKE ONE CAPSULE (0.5 MG) BY MOUTH EACH DAY. MAY TAKE WITH OR WITHOUTFOOD. STORE AT ROOM TEMPERATURE. 90 capsule 3  . imipramine (TOFRANIL) 25 MG tablet Take 1 tablet (25 mg total) by mouth at bedtime. 30 tablet 5  . loratadine (CLARITIN) 10 MG tablet Take 10 mg by mouth daily.    Marland Kitchen  losartan-hydrochlorothiazide (HYZAAR) 50-12.5 MG tablet Take 1 tablet by mouth daily. 90 tablet 1  . triamcinolone cream (KENALOG) 0.1 % Apply topically 3 (three) times daily. 45 g 1   Current Facility-Administered Medications on File Prior to Visit  Medication Dose Route Frequency Provider Last Rate Last Admin  . gadopentetate dimeglumine (MAGNEVIST) injection 20 mL  20 mL Intravenous Once PRN Sater, Pearletha Furl, MD        Past Medical History:  Diagnosis Date  . Diabetes mellitus without complication (HCC)   . Eczema   . Hypertension   . Multiple sclerosis (HCC)   . Vision abnormalities     Past Surgical History:  Procedure Laterality Date  . ABDOMINAL HYSTERECTOMY    . CARPAL TUNNEL RELEASE    . CERVICAL SPINE SURGERY     Dr. Sharolyn Douglas  . LASER ABLATION    . TENDON REPAIR Left    wrist surgery  . TUBAL LIGATION      Social History   Socioeconomic History  . Marital status: Married    Spouse name: Anothony  . Number of children: 3  . Years of education: 12+  . Highest education level: Not on file  Occupational History  . Not on file  Tobacco Use  . Smoking status: Never Smoker  . Smokeless tobacco: Never Used  Substance and Sexual Activity  . Alcohol use: Yes    Alcohol/week: 1.0 standard drinks    Types: 1 Glasses of wine per week    Comment: occasionally  . Drug use: No  . Sexual activity: Yes    Birth control/protection: Surgical  Other Topics Concern  . Not on file  Social History Narrative   Right handed    Caffeine EZM:OQHU sometimes.   Lives with husband,  Ethelene Browns   Social Determinants of Health   Financial Resource Strain:   . Difficulty of Paying Living Expenses:   Food Insecurity:   . Worried About Programme researcher, broadcasting/film/video in the Last Year:   . Barista in the Last Year:   Transportation Needs:   . Freight forwarder (Medical):   Marland Kitchen Lack of Transportation (Non-Medical):   Physical Activity:   . Days of Exercise per Week:   . Minutes  of Exercise per Session:   Stress:   . Feeling of Stress :   Social Connections:   . Frequency of Communication with Friends and Family:   . Frequency of Social Gatherings with Friends and Family:   . Attends Religious Services:   . Active Member of Clubs or Organizations:   . Attends Banker Meetings:   Marland Kitchen Marital Status:     Family History  Problem Relation Age of Onset  . Hypertension Mother   . Diabetes type II Mother   . COPD Father   . Multiple sclerosis Paternal Uncle   . Multiple sclerosis Paternal Grandfather        Review of Systems  Constitutional: Negative for  fever, malaise/fatigue and weight loss.  HENT: Negative for congestion and sore throat.   Eyes:       Negative for visual changes  Respiratory: Negative for cough and shortness of breath.   Cardiovascular: Negative for chest pain, palpitations and leg swelling.  Gastrointestinal: Negative for blood in stool, constipation, diarrhea and heartburn.  Genitourinary: Negative for dysuria, frequency and urgency.  Musculoskeletal: Negative for falls, joint pain and myalgias.  Skin: Negative for rash.  Neurological: Negative for dizziness, sensory change and headaches.  Endo/Heme/Allergies: Does not bruise/bleed easily.  Psychiatric/Behavioral: Negative for depression, substance abuse and suicidal ideas. The patient is not nervous/anxious.     Objective:   Vitals:   04/05/19 1016  BP: 118/80  Pulse: (!) 107  Temp: (!) 97 F (36.1 C)  SpO2: 97%    Body mass index is 37.54 kg/m.   Physical Examination:  Physical Exam Vitals reviewed. Exam conducted with a chaperone present.  Constitutional:      Appearance: She is obese.  HENT:     Right Ear: Tympanic membrane, ear canal and external ear normal.     Left Ear: Tympanic membrane, ear canal and external ear normal.  Eyes:     Extraocular Movements: Extraocular movements intact.     Conjunctiva/sclera: Conjunctivae normal.   Cardiovascular:     Rate and Rhythm: Normal rate and regular rhythm.     Pulses: Normal pulses.     Heart sounds: Normal heart sounds.  Pulmonary:     Effort: Pulmonary effort is normal.     Breath sounds: Normal breath sounds.  Chest:     Breasts:        Right: Normal.        Left: Normal.  Abdominal:     General: Bowel sounds are normal. There is no distension.     Palpations: Abdomen is soft.     Tenderness: There is no abdominal tenderness.  Genitourinary:    Labia:        Right: No rash or tenderness.        Left: No rash or tenderness.      Urethra: Prolapse present. No urethral pain or urethral swelling.     Vagina: Vaginal discharge present. No erythema or tenderness.     Adnexa: Right adnexa normal and left adnexa normal.     Rectum: No external hemorrhoid.     Comments: Cervix absent Musculoskeletal:        General: Normal range of motion.     Cervical back: Normal range of motion and neck supple.     Right lower leg: No edema.     Left lower leg: No edema.  Lymphadenopathy:     Cervical: No cervical adenopathy.     Upper Body:     Right upper body: No supraclavicular, axillary or pectoral adenopathy.     Left upper body: No supraclavicular, axillary or pectoral adenopathy.     Lower Body: No right inguinal adenopathy. No left inguinal adenopathy.  Skin:    General: Skin is warm and dry.  Neurological:     Mental Status: She is alert and oriented to person, place, and time.  Psychiatric:        Mood and Affect: Mood normal.        Behavior: Behavior normal.        Thought Content: Thought content normal.     ASSESSMENT and PLAN: This visit occurred during the SARS-CoV-2 public health emergency.  Safety protocols were in place,  including screening questions prior to the visit, additional usage of staff PPE, and extensive cleaning of exam room while observing appropriate contact time as indicated for disinfecting solutions.   Cathy [name] was seen today for annual  exam.  Diagnoses and all orders for this visit:  Encounter for preventative adult health care exam with abnormal findings -     MM DIGITAL SCREENING BILATERAL; Future -     Cancel: Cytology - PAP( [place]) -     CBC with Differential/Platelet -     Cytology - PAP( [place])  Type 2 diabetes mellitus with diabetic polyneuropathy, without long-term current use of insulin (HCC) -     Hemoglobin A1c -     Microalbumin / creatinine urine ratio -     SitaGLIPtin-MetFORMIN HCl (JANUMET XR) [phone] MG TB24; Take 1 tablet by mouth daily after breakfast.  Essential (primary) hypertension  Mixed hyperlipidemia  Encounter for Papanicolaou smear for cervical cancer screening -     Cancel: Cytology - PAP( [place]) -     Cytology - PAP( [place])  Breast cancer screening by mammogram -     MM DIGITAL SCREENING BILATERAL; Future  Colon cancer screening -     Ambulatory referral to Gastroenterology  FH: colon polyps -     Ambulatory referral to Gastroenterology  Multiple sclerosis [facility]) -     Ambulatory referral to Physical Therapy  Gait disturbance -     Ambulatory referral to Physical Therapy   No problem-specific Assessment & Plan notes found for this encounter.     Problem List Items Addressed This Visit      Cardiovascular and Mediastinum   Essential (primary) hypertension     Endocrine   Diabetes mellitus, type 2 (HCC)   Relevant Medications   SitaGLIPtin-MetFORMIN HCl (JANUMET XR) [phone] MG TB24   Other Relevant Orders   Hemoglobin A1c (Completed)   Microalbumin / creatinine urine ratio (Completed)     Nervous and Auditory   Multiple sclerosis (HCC)   Relevant Orders   Ambulatory referral to Physical Therapy     Other   Gait disturbance   Relevant Orders   Ambulatory referral to Physical Therapy   HLD (hyperlipidemia)    Other Visit Diagnoses    Encounter for preventative adult health care exam with abnormal findings    -  Primary   Relevant  Orders   MM DIGITAL SCREENING BILATERAL   CBC with Differential/Platelet (Completed)   Cytology - PAP( [place])   Encounter for Papanicolaou smear for cervical cancer screening       Relevant Orders   Cytology - PAP( )   Breast cancer screening by mammogram       Relevant Orders   MM DIGITAL SCREENING BILATERAL   Colon cancer screening       Relevant Orders   Ambulatory referral to Gastroenterology   FH: colon polyps       Relevant Orders   Ambulatory referral to Gastroenterology       Follow up: Return in about 3 months (around 07/06/2019) for DM and HTN, hyperlipidemia (fasting).  Wilfred Lacy, NP

## 2019-04-05 NOTE — Patient Instructions (Addendum)
Normal urine microalbumin Worsening hgbA1c 7.9 to 9.4 Medication and diet compliance is important. Resume janumet. Stable cbc. F/up in 546month instead of 546month  Your last colonoscopy was 01/2013 with Dr. ToAlice ReichertHe recommended repeat in [id number] will be contacted to schedule appt with GI.  You will be contacted to schedule appt for mammogram and with outpatient PT.   Preventive Care 40-57 54ars Old, Female Preventive care refers to visits with your health care provider and lifestyle choices that can promote health and wellness. This includes:  A yearly physical exam. This may also be called an annual well check.  Regular dental visits and eye exams.  Immunizations.  Screening for certain conditions.  Healthy lifestyle choices, such as eating a healthy diet, getting regular exercise, not using drugs or products that contain nicotine and tobacco, and limiting alcohol use. What can I expect for my preventive care visit? Physical exam Your health care provider will check your:  Height and weight. This may be used to calculate body mass index (BMI), which tells if you are at a healthy weight.  Heart rate and blood pressure.  Skin for abnormal spots. Counseling Your health care provider may ask you questions about your:  Alcohol, tobacco, and drug use.  Emotional well-being.  Home and relationship well-being.  Sexual activity.  Eating habits.  Work and work envStatisticianMethod of birth control.  Menstrual cycle.  Pregnancy history. What immunizations do I need?  Influenza (flu) vaccine  This is recommended every year. Tetanus, diphtheria, and pertussis (Tdap) vaccine  You may need a Td booster every 10 years. Varicella (chickenpox) vaccine  You may need this if you have not been vaccinated. Zoster (shingles) vaccine  You may need this after age 21.38easles, mumps, and rubella (MMR) vaccine  You may need at least one dose of MMR if you were born in  1957 or later. You may also need a second dose. Pneumococcal conjugate (PCV13) vaccine  You may need this if you have certain conditions and were not previously vaccinated. Pneumococcal polysaccharide (PPSV23) vaccine  You may need one or two doses if you smoke cigarettes or if you have certain conditions. Meningococcal conjugate (MenACWY) vaccine  You may need this if you have certain conditions. Hepatitis A vaccine  You may need this if you have certain conditions or if you travel or work in places where you may be exposed to hepatitis A. Hepatitis B vaccine  You may need this if you have certain conditions or if you travel or work in places where you may be exposed to hepatitis B. Haemophilus influenzae type b (Hib) vaccine  You may need this if you have certain conditions. Human papillomavirus (HPV) vaccine  If recommended by your health care provider, you may need three doses over 6 months. You may receive vaccines as individual doses or as more than one vaccine together in one shot (combination vaccines). Talk with your health care provider about the risks and benefits of combination vaccines. What tests do I need? Blood tests  Lipid and cholesterol levels. These may be checked every 5 years, or more frequently if you are over 50 54ars old.  Hepatitis C test.  Hepatitis B test. Screening  Lung cancer screening. You may have this screening every year starting at age 26 54 you have a 30-pack-year history of smoking and currently smoke or have quit within the past 15 years.  Colorectal cancer screening. All adults should have this screening starting at age 54  and continuing until age 72. Your health care provider may recommend screening at age 49 if you are at increased risk. You will have tests every 1-10 years, depending on your results and the type of screening test.  Diabetes screening. This is done by checking your blood sugar (glucose) after you have not eaten for a  while (fasting). You may have this done every 1-3 years.  Mammogram. This may be done every 1-2 years. Talk with your health care provider about when you should start having regular mammograms. This may depend on whether you have a family history of breast cancer.  BRCA-related cancer screening. This may be done if you have a family history of breast, ovarian, tubal, or peritoneal cancers.  Pelvic exam and Pap test. This may be done every 3 years starting at age 54. Starting at age 24, this may be done every 5 years if you have a Pap test in combination with an HPV test. Other tests  Sexually transmitted disease (STD) testing.  Bone density scan. This is done to screen for osteoporosis. You may have this scan if you are at high risk for osteoporosis. Follow these instructions at home: Eating and drinking  Eat a diet that includes fresh fruits and vegetables, whole grains, lean protein, and low-fat dairy.  Take vitamin and mineral supplements as recommended by your health care provider.  Do not drink alcohol if: ? Your health care provider tells you not to drink. ? You are pregnant, may be pregnant, or are planning to become pregnant.  If you drink alcohol: ? Limit how much you have to 0-1 drink a day. ? Be aware of how much alcohol is in your drink. In the [place] one drink equals one 12 oz bottle of beer (355 mL), one 5 oz glass of wine (148 mL), or one 1 oz glass of hard liquor (44 mL). Lifestyle  Take daily care of your teeth and gums.  Stay active. Exercise for at least 30 minutes on 5 or more days each week.  Do not use any products that contain nicotine or tobacco, such as cigarettes, e-cigarettes, and chewing tobacco. If you need help quitting, ask your health care provider.  If you are sexually active, practice safe sex. Use a condom or other form of birth control (contraception) in order to prevent pregnancy and STIs (sexually transmitted infections).  If told by your  health care provider, take low-dose aspirin daily starting at age 67. What's next?  Visit your health care provider once a year for a well check visit.  Ask your health care provider how often you should have your eyes and teeth checked.  Stay up to date on all vaccines. This information is not intended to replace advice given to you by your health care provider. Make sure you discuss any questions you have with your health care provider. Document Revised: 09/21/2017 Document Reviewed: 09/21/2017 Elsevier Patient Education  2020 [employer].

## 2019-04-08 ENCOUNTER — Encounter: Payer: Self-pay | Admitting: Nurse Practitioner

## 2019-04-08 NOTE — Progress Notes (Signed)
Abstracted result and sent to scan  

## 2019-04-09 LAB — CYTOLOGY - PAP
Comment: NEGATIVE
Diagnosis: NEGATIVE
High risk HPV: NEGATIVE

## 2019-04-29 ENCOUNTER — Other Ambulatory Visit: Payer: Self-pay

## 2019-04-29 ENCOUNTER — Ambulatory Visit
Admission: RE | Admit: 2019-04-29 | Discharge: 2019-04-29 | Disposition: A | Discharge status: Home / Self Care | Payer: Federal, State, Local not specified - PPO | Source: Ambulatory Visit | Attending: Nurse Practitioner | Admitting: Nurse Practitioner

## 2019-04-29 DIAGNOSIS — Z1231 Encounter for screening mammogram for malignant neoplasm of breast: Secondary | ICD-10-CM

## 2019-04-29 DIAGNOSIS — Z0001 Encounter for general adult medical examination with abnormal findings: Secondary | ICD-10-CM

## 2019-05-01 ENCOUNTER — Other Ambulatory Visit: Payer: Self-pay | Admitting: Neurology

## 2019-05-01 ENCOUNTER — Ambulatory Visit: Payer: Federal, State, Local not specified - PPO | Admitting: Physical Therapy

## 2019-05-01 MED ORDER — DEXTROAMPHETAMINE SULFATE ER 10 MG PO CP24: 20.0000 mg | ORAL_CAPSULE | Freq: Every day | ORAL | 0 refills | Status: DC

## 2019-05-01 NOTE — Addendum Note (Signed)
Addended by: Arther Abbott on: 05/01/2019 12:03 PM   Modules accepted: Orders

## 2019-05-01 NOTE — Telephone Encounter (Signed)
Pt has called for a refill on her dextroamphetamine (DEXEDRINE) 10 MG 24 hr capsule [facility] [phone] -

## 2019-05-07 ENCOUNTER — Other Ambulatory Visit: Payer: Self-pay | Admitting: Neurology

## 2019-05-09 DIAGNOSIS — Z23 Encounter for immunization: Secondary | ICD-10-CM | POA: Diagnosis not present

## 2019-05-13 ENCOUNTER — Ambulatory Visit: Payer: Federal, State, Local not specified - PPO

## 2019-05-21 ENCOUNTER — Encounter: Payer: Federal, State, Local not specified - PPO | Admitting: Physical Therapy

## 2019-05-23 ENCOUNTER — Encounter: Payer: Federal, State, Local not specified - PPO | Admitting: Physical Therapy

## 2019-05-30 ENCOUNTER — Encounter: Payer: Federal, State, Local not specified - PPO | Admitting: Physical Therapy

## 2019-05-30 DIAGNOSIS — Z23 Encounter for immunization: Secondary | ICD-10-CM | POA: Diagnosis not present

## 2019-06-03 ENCOUNTER — Other Ambulatory Visit: Payer: Self-pay | Admitting: Neurology

## 2019-06-03 MED ORDER — DEXTROAMPHETAMINE SULFATE ER 10 MG PO CP24: 20.0000 mg | ORAL_CAPSULE | Freq: Every day | ORAL | 0 refills | Status: DC

## 2019-06-03 NOTE — Telephone Encounter (Signed)
Pt has called for a refill on her dextroamphetamine (DEXEDRINE) 10 MG 24 hr capsule [facility] #[id number] - 

## 2019-06-03 NOTE — Addendum Note (Signed)
Addended by: Arther Abbott on: 06/03/2019 08:49 AM   Modules accepted: Orders

## 2019-06-04 ENCOUNTER — Telehealth: Payer: Self-pay | Admitting: Neurology

## 2019-06-04 NOTE — Telephone Encounter (Signed)
Called patient back. She reports she is having intermittent numbness in both arms that started shortly after her 03/26/19 visit with Dr. Epimenio Foot. Usually occurs about 5 min and then resolves. Confirmed she is still taking Gilenya and denies missing any doses. Denies any signs/sx of infection. Advised I will discuss with MD and call her back with recommendation.  She received her 2nd Covid-19 vaccine: 05-30-19 Proofreader)

## 2019-06-04 NOTE — Telephone Encounter (Signed)
Spoke with Dr. Epimenio Foot. Since sx are intermittent, unlikely it is an exacerbation. If severe or worsening over time, he will order MRI cervical w/wo

## 2019-06-04 NOTE — Telephone Encounter (Signed)
Patient called to report shortly after last visit she has been experiencing numbness in both arms and is unsure what she should do.

## 2019-06-04 NOTE — Telephone Encounter (Signed)
Called pt. Relayed Dr. Bonnita Hollow recommendation. Pt verbalized understanding and is agreeable with this plan.

## 2019-06-13 ENCOUNTER — Encounter: Payer: Federal, State, Local not specified - PPO | Admitting: Physical Therapy

## 2019-06-14 ENCOUNTER — Other Ambulatory Visit: Payer: Self-pay | Admitting: Nurse Practitioner

## 2019-06-14 DIAGNOSIS — E782 Mixed hyperlipidemia: Secondary | ICD-10-CM

## 2019-06-21 DIAGNOSIS — H468 Other optic neuritis: Secondary | ICD-10-CM | POA: Diagnosis not present

## 2019-06-21 DIAGNOSIS — Z79899 Other long term (current) drug therapy: Secondary | ICD-10-CM | POA: Diagnosis not present

## 2019-06-21 DIAGNOSIS — G35 Multiple sclerosis: Secondary | ICD-10-CM | POA: Diagnosis not present

## 2019-06-21 DIAGNOSIS — H04123 Dry eye syndrome of bilateral lacrimal glands: Secondary | ICD-10-CM | POA: Diagnosis not present

## 2019-06-28 ENCOUNTER — Encounter: Payer: Self-pay | Admitting: Nurse Practitioner

## 2019-06-28 ENCOUNTER — Other Ambulatory Visit: Payer: Self-pay

## 2019-06-28 ENCOUNTER — Telehealth (INDEPENDENT_AMBULATORY_CARE_PROVIDER_SITE_OTHER): Payer: Federal, State, Local not specified - PPO | Admitting: Nurse Practitioner

## 2019-06-28 VITALS — Ht 65.0 in | Wt 223.0 lb

## 2019-06-28 DIAGNOSIS — M79671 Pain in right foot: Secondary | ICD-10-CM

## 2019-06-28 DIAGNOSIS — M25561 Pain in right knee: Secondary | ICD-10-CM

## 2019-06-28 DIAGNOSIS — G8929 Other chronic pain: Secondary | ICD-10-CM | POA: Diagnosis not present

## 2019-06-28 MED ORDER — PREDNISONE 20 MG PO TABS: 30.0000 mg | ORAL_TABLET | Freq: Every day | ORAL | 0 refills | Status: DC

## 2019-06-28 NOTE — Progress Notes (Signed)
Virtual Visit via Video Note  I connected with@ on 06/28/19 at  1:30 PM EDT by a video enabled telemedicine application and verified that I am speaking with the correct person using two identifiers.  Location: Patient:Home Provider: Office Participants: patient and provider  I discussed the limitations of evaluation and management by telemedicine and the availability of in person appointments. I also discussed with the patient that there may be a patient responsible charge related to this service. The patient expressed understanding and agreed to proceed.  XB:DZHGD foot and knee pain post fall 45yr ago //pt said it just seems to get worse//pt complains of pain and swelling//link sent to email-mychart wasn't working for her  History of Present Illness: Postprandial glucose today: 164 She was evaluated by [facility] provided after fall 01/2018: normal knee and foot x-ray. Reports intermittent Right knee swelling and persistent right foot swelling. Some improvement with pennsaid gel. Unable to tolerate oral NSAIDs. Denies any recent injury.   Observations/Objective: Physical Exam  Constitutional: She is oriented to person, place, and time. No distress.  Pulmonary/Chest: Effort normal.  Musculoskeletal:        General: Edema present. No deformity.     Right knee: Swelling present. No erythema. Normal range of motion.     Left knee: Normal.     Right lower leg: Normal.     Left lower leg: Normal.     Right ankle: Swelling present. No tenderness.     Left ankle: Normal.     Right foot: Normal range of motion. Swelling present.     Left foot: Normal.  Neurological: She is alert and oriented to person, place, and time.  Skin: No rash noted. No erythema.  Psychiatric: She has a normal mood and affect. Her behavior is normal.   Assessment and Plan: Cathy [name] was seen today for pain.  Diagnoses and all orders for this visit:  Chronic pain of right knee -     AMB referral to orthopedics -      predniSONE (DELTASONE) 20 MG tablet; Take 1.5 tablets (30 mg total) by mouth daily with breakfast.  Chronic pain in right foot -     AMB referral to orthopedics -     predniSONE (DELTASONE) 20 MG tablet; Take 1.5 tablets (30 mg total) by mouth daily with breakfast.   Follow Up Instructions: You will be contacted to schedule appt with ortho Call office and schedule DM f/up appt (f2F)   I discussed the assessment and treatment plan with the patient. The patient was provided an opportunity to ask questions and all were answered. The patient agreed with the plan and demonstrated an understanding of the instructions.   The patient was advised to call back or seek an in-person evaluation if the symptoms worsen or if the condition fails to improve as anticipated.   Cathy Penna, NP

## 2019-07-02 ENCOUNTER — Telehealth: Payer: Self-pay | Admitting: Neurology

## 2019-07-02 ENCOUNTER — Other Ambulatory Visit: Payer: Self-pay | Admitting: *Deleted

## 2019-07-02 MED ORDER — DEXTROAMPHETAMINE SULFATE ER 10 MG PO CP24: 20.0000 mg | ORAL_CAPSULE | Freq: Every day | ORAL | 0 refills | Status: DC

## 2019-07-02 NOTE — Telephone Encounter (Signed)
Sent request for dexedrine refill to [facility], CD,MD to e-scribe today for pt.

## 2019-07-02 NOTE — Telephone Encounter (Signed)
Called pt to let her know [facility] will send in refill. Advised Dr. Epimenio Foot back tomorrow and can sign handicap placard form then. She would like it mailed once ready. I verified address on file. Nothing further needed.

## 2019-07-02 NOTE — Telephone Encounter (Signed)
Pt has called for a refill on her dextroamphetamine (DEXEDRINE) 10 MG 24 hr capsule to [facility] #[id number] - Pt would also like a call from RN to discuss how to go about renewing her handicap placcard

## 2019-07-03 NOTE — Telephone Encounter (Signed)
Placed completed/signed form in the mail for pt.

## 2019-07-08 ENCOUNTER — Other Ambulatory Visit: Payer: Self-pay

## 2019-07-09 ENCOUNTER — Ambulatory Visit: Payer: Federal, State, Local not specified - PPO | Admitting: Nurse Practitioner

## 2019-07-15 ENCOUNTER — Other Ambulatory Visit: Payer: Federal, State, Local not specified - PPO

## 2019-07-15 ENCOUNTER — Other Ambulatory Visit: Payer: Self-pay

## 2019-07-15 ENCOUNTER — Encounter: Payer: Self-pay | Admitting: Nurse Practitioner

## 2019-07-15 ENCOUNTER — Ambulatory Visit: Payer: Federal, State, Local not specified - PPO | Admitting: Nurse Practitioner

## 2019-07-15 VITALS — BP 128/80 | HR 106 | Temp 96.6°F | Ht 65.0 in | Wt 222.6 lb

## 2019-07-15 DIAGNOSIS — E1165 Type 2 diabetes mellitus with hyperglycemia: Secondary | ICD-10-CM | POA: Diagnosis not present

## 2019-07-15 DIAGNOSIS — I10 Essential (primary) hypertension: Secondary | ICD-10-CM | POA: Diagnosis not present

## 2019-07-15 DIAGNOSIS — Z1159 Encounter for screening for other viral diseases: Secondary | ICD-10-CM | POA: Diagnosis not present

## 2019-07-15 DIAGNOSIS — R7309 Other abnormal glucose: Secondary | ICD-10-CM | POA: Diagnosis not present

## 2019-07-15 DIAGNOSIS — E1142 Type 2 diabetes mellitus with diabetic polyneuropathy: Secondary | ICD-10-CM | POA: Diagnosis not present

## 2019-07-15 LAB — BASIC METABOLIC PANEL
BUN: 13 mg/dL (ref 6–23)
CO2: 25 mEq/L (ref 19–32)
Calcium: 9.9 mg/dL (ref 8.4–10.5)
Chloride: 96 mEq/L (ref 96–112)
Creatinine, Ser: 0.72 mg/dL (ref 0.40–1.20)
GFR: 102.29 mL/min (ref >60.00)
Glucose, Bld: 361 mg/dL — ABNORMAL HIGH (ref 70–99)
Potassium: 4 mEq/L (ref 3.5–5.1)
Sodium: 132 mEq/L — ABNORMAL LOW (ref 135–145)

## 2019-07-15 NOTE — Patient Instructions (Addendum)
Worsening HgbA1c: 9.4 to 10.4 Start ozempic injection. New rx sent. Monitor glucose daily in AM and record. F/up in 60month (video appt)  Schedule appt for colonoscopy  You will be contacted to schedule appt with nutritionist.  Need to check glucose daily in AM (before breakfast).

## 2019-07-15 NOTE — Progress Notes (Signed)
Subjective:  Patient ID: Cathy [name], female    DOB: 1965-03-24  Age: 54 y.o. MRN: [id number]  CC: Follow-up (3 month-DM, HTN, hyperlipidemia-pt had a sip of coffee no sugar//blood sugars been in he 200's//BP-hasn't been checking//no colonoscopy)  HPI DM: uncontrolled Fasting glucose this AM:234. Does not check glucose daily Admits to noncompliance with diet. Current use of janumet BP and LDL at goal. No adverse side effects with current medications  Reviewed past Medical, Social and Family history today.  Outpatient Medications Prior to Visit  Medication Sig Dispense Refill  . atorvastatin (LIPITOR) 40 MG tablet TAKE 1 TABLET(40 MG) BY MOUTH DAILY AT 6 PM 90 tablet 3  . Cholecalciferol (VITAMIN D) 2000 UNITS CAPS Take 2,000 Units by mouth daily.    Marland Kitchen dextroamphetamine (DEXEDRINE) 10 MG 24 hr capsule Take 2-4 capsules (20-40 mg total) by mouth daily. 120 capsule 0  . diclofenac Sodium (VOLTAREN) 1 % GEL Apply topically.    . Fingolimod HCl (GILENYA) 0.5 MG CAPS TAKE ONE CAPSULE (0.5 MG) BY MOUTH EACH DAY. MAY TAKE WITH OR WITHOUTFOOD. STORE AT ROOM TEMPERATURE. 90 capsule 3  . imipramine (TOFRANIL) 25 MG tablet TAKE 1 TABLET(25 MG) BY MOUTH AT BEDTIME 30 tablet 5  . loratadine (CLARITIN) 10 MG tablet Take 10 mg by mouth daily.    Marland Kitchen triamcinolone cream (KENALOG) 0.1 % Apply topically 3 (three) times daily. 45 g 1  . losartan-hydrochlorothiazide (HYZAAR) 50-12.5 MG tablet Take 1 tablet by mouth daily. 90 tablet 1  . SitaGLIPtin-MetFORMIN HCl (JANUMET XR) [phone] MG TB24 Take 1 tablet by mouth daily after breakfast. 90 tablet 1  . predniSONE (DELTASONE) 20 MG tablet Take 1.5 tablets (30 mg total) by mouth daily with breakfast. (Patient not taking: Reported on 07/15/2019) 3 tablet 0   Facility-Administered Medications Prior to Visit  Medication Dose Route Frequency Provider Last Rate Last Admin  . gadopentetate dimeglumine (MAGNEVIST) injection 20 mL  20 mL Intravenous  Once PRN Sater, Richard A, MD        ROS See HPI  Objective:  BP 128/80   Pulse (!) 106   Temp (!) 96.6 F (35.9 C) (Tympanic)   Ht 5\' 5"  (1.651 m)   Wt 222 lb 9.6 oz (101 kg)   SpO2 95%   BMI 37.04 kg/m   BP Readings from Last 3 Encounters:  07/15/19 128/80  04/05/19 118/80  03/26/19 (!) 157/82    Wt Readings from Last 3 Encounters:  07/15/19 222 lb 9.6 oz (101 kg)  06/28/19 223 lb (101.2 kg)  04/05/19 225 lb 9.6 oz (102.3 kg)    Physical Exam Vitals reviewed.  Constitutional:      Appearance: She is obese.  Cardiovascular:     Rate and Rhythm: Normal rate and regular rhythm.     Pulses: Normal pulses.     Heart sounds: Normal heart sounds.  Pulmonary:     Effort: Pulmonary effort is normal.     Breath sounds: Normal breath sounds.  Musculoskeletal:     Right lower leg: No edema.     Left lower leg: No edema.  Neurological:     Mental Status: She is oriented to person, place, and time.     Lab Results  Component Value Date   WBC 3.9 (L) 04/05/2019   HGB 12.5 04/05/2019   HCT 36.6 04/05/2019   PLT 306.0 04/05/2019   GLUCOSE 361 (H) 07/15/2019   CHOL 145 02/11/2019   TRIG 114.0 02/11/2019   HDL 49.80  02/11/2019   LDLCALC 73 02/11/2019   ALT 24 02/11/2019   AST 18 02/11/2019   NA 132 (L) 07/15/2019   K 4.0 07/15/2019   CL 96 07/15/2019   CREATININE 0.72 07/15/2019   BUN 13 07/15/2019   CO2 25 07/15/2019   TSH 3.01 02/11/2019   HGBA1C 10.4 (H) 07/15/2019   MICROALBUR 1.2 04/05/2019   Assessment & Plan:  This visit occurred during the SARS-CoV-2 public health emergency.  Safety protocols were in place, including screening questions prior to the visit, additional usage of staff PPE, and extensive cleaning of exam room while observing appropriate contact time as indicated for disinfecting solutions.   Kodi was seen today for follow-up.  Diagnoses and all orders for this visit:  Essential (primary) hypertension -     Basic metabolic panel -      losartan-hydrochlorothiazide (HYZAAR) 50-12.5 MG tablet; Take 1 tablet by mouth daily.  Type 2 diabetes mellitus with hyperglycemia, without long-term current use of insulin (HCC) -     Hemoglobin A1c -     Basic metabolic panel -     Referral to Nutrition and Diabetes Services -     Semaglutide, 1 MG/DOSE, (OZEMPIC, 1 MG/DOSE,) 2 MG/1.5ML SOPN; Inject 0.75 mLs (1 mg total) into the skin once a week.  Encounter for hepatitis C screening test for low risk patient -     Hepatitis C Antibody  Type 2 diabetes mellitus with diabetic polyneuropathy, without long-term current use of insulin (HCC) -     SitaGLIPtin-MetFORMIN HCl (JANUMET XR) [phone] MG TB24; Take 1 tablet by mouth daily after breakfast.   I have discontinued Addelynn A. Buntyn's predniSONE. I am also having her start on Ozempic (1 MG/DOSE). Additionally, I am having her maintain her Vitamin D, loratadine, triamcinolone cream, diclofenac Sodium, Gilenya, imipramine, atorvastatin, dextroamphetamine, losartan-hydrochlorothiazide, and Janumet XR.  Meds ordered this encounter  Medications  . Semaglutide, 1 MG/DOSE, (OZEMPIC, 1 MG/DOSE,) 2 MG/1.5ML SOPN    Sig: Inject 0.75 mLs (1 mg total) into the skin once a week.    Dispense:  2 pen    Refill:  2    Order Specific Question:   Supervising Provider    Answer:   Ronnald Nian [id number]  . losartan-hydrochlorothiazide (HYZAAR) 50-12.5 MG tablet    Sig: Take 1 tablet by mouth daily.    Dispense:  90 tablet    Refill:  3    Order Specific Question:   Supervising Provider    Answer:   Ronnald Nian [[id number]]  . SitaGLIPtin-MetFORMIN HCl (JANUMET XR) [phone] MG TB24    Sig: Take 1 tablet by mouth daily after breakfast.    Dispense:  90 tablet    Refill:  1    Order Specific Question:   Supervising Provider    Answer:   Ronnald Nian [[id number]]    Problem List Items Addressed This Visit      Cardiovascular and Mediastinum   Essential (primary) hypertension -  Primary   Relevant Medications   losartan-hydrochlorothiazide (HYZAAR) 50-12.5 MG tablet   Other Relevant Orders   Basic metabolic panel (Completed)     Endocrine   DM (diabetes mellitus) ([place])   Relevant Medications   Semaglutide, 1 MG/DOSE, (OZEMPIC, 1 MG/DOSE,) 2 MG/1.5ML SOPN   losartan-hydrochlorothiazide (HYZAAR) 50-12.5 MG tablet   SitaGLIPtin-MetFORMIN HCl (JANUMET XR) [phone] MG TB24   Other Relevant Orders   Hemoglobin Y4M   Basic metabolic panel (Completed)   Referral to Nutrition and Diabetes  Services    Other Visit Diagnoses    Encounter for hepatitis C screening test for low risk patient       Relevant Orders   Hepatitis C Antibody      Follow-up: Return in about 4 weeks (around 08/12/2019) for DM (video).  Alysia Penna, NP

## 2019-07-16 LAB — HEMOGLOBIN A1C
Hgb A1c MFr Bld: 10.4 % of total Hgb — ABNORMAL HIGH (ref <5.7)
Mean Plasma Glucose: 252 (calc)
eAG (mmol/L): 13.9 (calc)

## 2019-07-16 LAB — HEPATITIS C ANTIBODY
Hepatitis C Ab: NONREACTIVE
SIGNAL TO CUT-OFF: 0.01 (ref <1.00)

## 2019-07-16 MED ORDER — OZEMPIC (1 MG/DOSE) 2 MG/1.5ML ~~LOC~~ SOPN: 1.0000 mg | PEN_INJECTOR | SUBCUTANEOUS | 2 refills | Status: DC

## 2019-07-16 MED ORDER — JANUMET XR 100-1000 MG PO TB24: 1.0000 | ORAL_TABLET | Freq: Every day | ORAL | 1 refills | Status: DC

## 2019-07-16 MED ORDER — LOSARTAN POTASSIUM-HCTZ 50-12.5 MG PO TABS: 1.0000 | ORAL_TABLET | Freq: Every day | ORAL | 3 refills | Status: DC

## 2019-07-16 NOTE — Assessment & Plan Note (Signed)
Worsening HgbA1c: 9.4 to 10.4 Maintain janumet XR Start ozempic injection. New rx sent. Monitor glucose daily in AM and record. Entered referral to nutritionist F/up in 13month (video appt)

## 2019-07-16 NOTE — Progress Notes (Signed)
Pt was notified and given the GI number and said she would contact them.

## 2019-07-31 ENCOUNTER — Other Ambulatory Visit: Payer: Self-pay | Admitting: Neurology

## 2019-07-31 MED ORDER — DEXTROAMPHETAMINE SULFATE ER 10 MG PO CP24: 20.0000 mg | ORAL_CAPSULE | Freq: Every day | ORAL | 0 refills | Status: DC

## 2019-07-31 NOTE — Telephone Encounter (Signed)
Pt is requesting a refill for dextroamphetamine (DEXEDRINE) 10 MG 24 hr capsule.  Pharmacy: [id number] -   

## 2019-07-31 NOTE — Addendum Note (Signed)
Addended by: Arther Abbott on: 07/31/2019 12:08 PM   Modules accepted: Orders

## 2019-08-05 ENCOUNTER — Telehealth: Payer: Self-pay | Admitting: Neurology

## 2019-08-05 NOTE — Telephone Encounter (Addendum)
Submitted urgent PA on CMM. Key: [id number]. Received instant approval with [employer] employee program effective 07/06/2019 to 08/04/2020. I called [facility] back and LVM letting them know PA was approved.

## 2019-08-05 NOTE — Telephone Encounter (Signed)
[facility] in [place] called and LVM on Friday 11am @ , stating that they have been trying to send in a PA request for the pt's dextroamphetamine (DEXEDRINE) 10 MG 24 hr capsule but the fax message keeps saying that it keeps failing. Please call [phone] no name was given.

## 2019-08-07 DIAGNOSIS — E1165 Type 2 diabetes mellitus with hyperglycemia: Secondary | ICD-10-CM | POA: Diagnosis not present

## 2019-08-07 DIAGNOSIS — M79671 Pain in right foot: Secondary | ICD-10-CM | POA: Diagnosis not present

## 2019-08-07 DIAGNOSIS — G35 Multiple sclerosis: Secondary | ICD-10-CM | POA: Diagnosis not present

## 2019-08-14 ENCOUNTER — Ambulatory Visit: Payer: Federal, State, Local not specified - PPO | Admitting: Skilled Nursing Facility1

## 2019-08-15 ENCOUNTER — Telehealth (INDEPENDENT_AMBULATORY_CARE_PROVIDER_SITE_OTHER): Payer: Federal, State, Local not specified - PPO | Admitting: Nurse Practitioner

## 2019-08-15 ENCOUNTER — Other Ambulatory Visit: Payer: Self-pay

## 2019-08-15 ENCOUNTER — Encounter: Payer: Self-pay | Admitting: Nurse Practitioner

## 2019-08-15 VITALS — BP 106/64 | HR 105 | Ht 65.0 in | Wt 211.0 lb

## 2019-08-15 DIAGNOSIS — I10 Essential (primary) hypertension: Secondary | ICD-10-CM | POA: Diagnosis not present

## 2019-08-15 DIAGNOSIS — E1165 Type 2 diabetes mellitus with hyperglycemia: Secondary | ICD-10-CM

## 2019-08-15 DIAGNOSIS — N898 Other specified noninflammatory disorders of vagina: Secondary | ICD-10-CM

## 2019-08-15 MED ORDER — FLUCONAZOLE 150 MG PO TABS: 150.0000 mg | ORAL_TABLET | Freq: Every day | ORAL | 0 refills | Status: DC

## 2019-08-15 NOTE — Assessment & Plan Note (Signed)
Improving glucose with Janumet and ozempic weekly Glucose 105-160 (Fasting) BP 106/64, HR 105 Reports nausea and dizziness during the 1st the first week of ozempic use (these have since resolved).  Hold janumet if fasting glucose<120 Continue ozempic injection as prescribed F/up in 75month

## 2019-08-15 NOTE — Progress Notes (Signed)
Virtual Visit via Video Note  I connected with@ on 08/15/19 at  1:30 PM EDT by a video enabled telemedicine application and verified that I am speaking with the correct person using two identifiers.  Location: Patient:Home Provider: Office Participants: patient and provider  I discussed the limitations of evaluation and management by telemedicine and the availability of in person appointments. I also discussed with the patient that there may be a patient responsible charge related to this service. The patient expressed understanding and agreed to proceed.  CC:1 month for DM-pt reports today it is 105, it had been near 160 but now its coming down, pt sent a link  History of Present Illness: DM: Improving glucose with Janumet and ozempic weekly Glucose 105-160 (Fasting) BP 106/64, HR 105 Reports nausea and dizziness during the 1st the first week of ozempic use (these have since resolved).   Vaginal itching: She also reports vaginal itching and odor in last 1week. Denies any concern for STI. No dysuria or pelvic pain or fever.  Observations/Objective: Physical Exam Cardiovascular:     Rate and Rhythm: Normal rate.     Pulses: Normal pulses.  Neurological:     Mental Status: She is alert and oriented to person, place, and time.    Assessment and Plan: Jennel was seen today for follow-up.  Diagnoses and all orders for this visit:  Type 2 diabetes mellitus with hyperglycemia, without long-term current use of insulin (HCC)  Essential (primary) hypertension  Vaginal itching -     fluconazole (DIFLUCAN) 150 MG tablet; Take 1 tablet (150 mg total) by mouth daily. Take second tab 3days apart from first tab   Follow Up Instructions: Check BP daily and send reading through mychart on Monday Hold janumet if fasting glucose<120 Continue ozempic injection as prescribed F/up in 35month.   I discussed the assessment and treatment plan with the patient. The patient was provided an  opportunity to ask questions and all were answered. The patient agreed with the plan and demonstrated an understanding of the instructions.   The patient was advised to call back or seek an in-person evaluation if the symptoms worsen or if the condition fails to improve as anticipated.   Alysia Penna, NP

## 2019-08-15 NOTE — Assessment & Plan Note (Addendum)
BP at goal but lower than previous recorded. Advised to maintain adequate oral hydration, monitor BP daily and report back in 3days. Current use of losartan/HCTZ BP Readings from Last 3 Encounters:  08/15/19 (!) 106/64  07/15/19 128/80  04/05/19 118/80   Will need to adjust losartan/HCTZ dose of SBP persistently<110

## 2019-08-15 NOTE — Patient Instructions (Signed)
Check BP daily and send reading through mychart on Monday Hold janumet if fasting glucose<120 Continue ozempic injection as prescribed

## 2019-09-02 ENCOUNTER — Telehealth: Payer: Self-pay | Admitting: Neurology

## 2019-09-02 MED ORDER — DEXTROAMPHETAMINE SULFATE ER 10 MG PO CP24: 20.0000 mg | ORAL_CAPSULE | Freq: Every day | ORAL | 0 refills | Status: DC

## 2019-09-02 NOTE — Telephone Encounter (Signed)
Pt is needing a refill on her dextroamphetamine (DEXEDRINE) 10 MG 24 hr capsule sent in to the [facility] on [address] In [place]

## 2019-09-02 NOTE — Telephone Encounter (Signed)
I have routed this request to Dr Sater for review. The pt is due for the medication and [place] registry was verified.  

## 2019-09-26 ENCOUNTER — Encounter: Payer: Self-pay | Admitting: Family Medicine

## 2019-09-26 ENCOUNTER — Other Ambulatory Visit: Payer: Self-pay

## 2019-09-26 ENCOUNTER — Ambulatory Visit: Payer: Federal, State, Local not specified - PPO | Admitting: Family Medicine

## 2019-09-26 VITALS — BP 134/83 | HR 116 | Ht 65.0 in | Wt 210.0 lb

## 2019-09-26 DIAGNOSIS — G8929 Other chronic pain: Secondary | ICD-10-CM

## 2019-09-26 DIAGNOSIS — R5383 Other fatigue: Secondary | ICD-10-CM | POA: Diagnosis not present

## 2019-09-26 DIAGNOSIS — R519 Headache, unspecified: Secondary | ICD-10-CM | POA: Diagnosis not present

## 2019-09-26 DIAGNOSIS — G35 Multiple sclerosis: Secondary | ICD-10-CM

## 2019-09-26 MED ORDER — TRIAMCINOLONE ACETONIDE 0.1 % EX CREA: TOPICAL_CREAM | Freq: Three times a day (TID) | CUTANEOUS | 1 refills | Status: DC

## 2019-09-26 MED ORDER — IMIPRAMINE HCL 25 MG PO TABS: ORAL_TABLET | ORAL | 5 refills | Status: DC

## 2019-09-26 NOTE — Patient Instructions (Signed)
We will continue current treatment plan.   Follow up in 6 months   Multiple Sclerosis Multiple sclerosis (MS) is a disease of the brain, spinal cord, and optic nerves (central nervous system). It causes the body's disease-fighting (immune) system to destroy the protective covering (myelin sheath) around nerves in the brain. When this happens, signals (nerve impulses) going to and from the brain and spinal cord do not get sent properly or may not get sent at all. There are several types of MS:  Relapsing-remitting MS. This is the most common type. This causes sudden attacks of symptoms. After an attack, you may recover completely until the next attack, or some symptoms may remain permanently.  Secondary progressive MS. This usually develops after the onset of relapsing-remitting MS. Similar to relapsing-remitting MS, this type also causes sudden attacks of symptoms. Attacks may be less frequent, but symptoms slowly get worse (progress) over time.  Primary progressive MS. This causes symptoms that steadily progress over time. This type of MS does not cause sudden attacks of symptoms. The age of onset of MS varies, but it often develops between 14-22 years of age. MS is a lifelong (chronic) condition. There is no cure, but treatment can help slow down the progression of the disease. What are the causes? The cause of this condition is not known. What increases the risk? You are more likely to develop this condition if:  You are a woman.  You have a relative with MS. However, the condition is not passed from parent to child (inherited).  You have a lack (deficiency) of vitamin D.  You smoke. MS is more common in the [place] than in the [place]. What are the signs or symptoms? Relapsing-remitting and secondary progressive MS cause symptoms to occur in episodes or attacks that may last weeks to months. There may be long periods between attacks in which there are  almost no symptoms. Primary progressive MS causes symptoms to steadily progress after they develop. Symptoms of MS vary because of the many different ways it affects the central nervous system. The main symptoms include:  Vision problems and eye pain.  Numbness.  Weakness.  Inability to move your arms, hands, feet, or legs (paralysis).  Balance problems.  Shaking that you cannot control (tremors).  Muscle spasms.  Problems with thinking (cognitive changes). MS can also cause symptoms that are associated with the disease, but are not always the direct result of an MS attack. They may include:  Inability to control urination or bowel movements (incontinence).  Headaches.  Fatigue.  Inability to tolerate heat.  Emotional changes.  Depression.  Pain. How is this diagnosed? This condition is diagnosed based on:  Your symptoms.  A neurological exam. This involves checking central nervous system function, such as nerve function, reflexes, and coordination.  MRIs of the brain and spinal cord.  Lab tests, including a lumbar puncture that tests the fluid that surrounds the brain and spinal cord (cerebrospinal fluid).  Tests to measure the electrical activity of the brain in response to stimulation (evoked potentials). How is this treated? There is no cure for MS, but medicines can help decrease the number and frequency of attacks and help relieve nuisance symptoms. Treatment options may include:  Medicines that reduce the frequency of attacks. These medicines may be given by injection, by mouth (orally), or through an IV.  Medicines that reduce inflammation (steroids). These may provide short-term relief of symptoms.  Medicines to help control pain, depression, fatigue,  or incontinence.  Vitamin D, if you have a deficiency.  Using devices to help you move around (assistive devices), such as braces, a cane, or a walker.  Physical therapy to strengthen and stretch your  muscles.  Occupational therapy to help you with everyday tasks.  Alternative or complementary treatments such as exercise, massage, or acupuncture. Follow these instructions at home:  Take over-the-counter and prescription medicines only as told by your health care provider.  Do not drive or use heavy machinery while taking prescription pain medicine.  Use assistive devices as recommended by your physical therapist or your health care provider.  Exercise as directed by your health care provider.  Return to your normal activities as told by your health care provider. Ask your health care provider what activities are safe for you.  Reach out for support. Share your feelings with friends, family, or a support group.  Keep all follow-up visits as told by your health care provider and therapists. This is important. Where to find more information  National Multiple Sclerosis Society: https://www.nationalmssociety.org Contact a health care provider if:  You feel depressed.  You develop new pain or numbness.  You have tremors.  You have problems with sexual function. Get help right away if:  You develop paralysis.  You develop numbness.  You have problems with your bladder or bowel function.  You develop double vision.  You lose vision in one or both eyes.  You develop suicidal thoughts.  You develop severe confusion. If you ever feel like you may hurt yourself or others, or have thoughts about taking your own life, get help right away. You can go to your nearest emergency department or call:  Your local emergency services (911 in the [place]  A suicide crisis helpline, such as the National Suicide Prevention Lifeline at 909-719-1624. This is open 24 hours a day. Summary  Multiple sclerosis (MS) is a disease of the central nervous system that causes the body's immune system to destroy the protective covering (myelin sheath) around nerves in the brain.  There are 3  types of MS: relapsing-remitting, secondary progressive, and primary progressive. Relapsing-remitting and secondary progressive MS cause symptoms to occur in episodes or attacks that may last weeks to months. Primary progressive MS causes symptoms to steadily progress after they develop.  There is no cure for MS, but medicines can help decrease the number and frequency of attacks and help relieve nuisance symptoms. Treatment may also include physical or occupational therapy.  If you develop numbness, paralysis, vision problems, or other neurological symptoms, get help right away. This information is not intended to replace advice given to you by your health care provider. Make sure you discuss any questions you have with your health care provider. Document Revised: 12/23/2016 Document Reviewed: 03/21/2016 Elsevier Patient Education  2020 [employer].

## 2019-09-26 NOTE — Progress Notes (Signed)
I have read the note, and I agree with the clinical assessment and plan.  Terez Montee A. Cleotha Tsang, MD, PhD, FAAN Certified in Neurology, Clinical Neurophysiology, Sleep Medicine, Pain Medicine and Neuroimaging  [facility] [address], [address] [place], [place] [address] [phone]  

## 2019-09-26 NOTE — Progress Notes (Signed)
PATIENT: Cathy [name] DOB: 09-03-1965  REASON FOR VISIT: follow up HISTORY FROM: patient  Chief Complaint  Patient presents with  . Follow-up    rm 1  . Multiple Sclerosis    Pt has no new or concerning sx.     HISTORY OF PRESENT ILLNESS: Today 09/26/19 Cathy [name] is a 54 y.o. female here today for follow up RRMS.She continues Gilenya. Last MRI brain and cervical spine stable 06/27/2018. Labs stable in 03/2019.   She feels that she is doing well, overall. No new or worsening symptoms. Headaches are well managed on imipramine. She continues dextroamphetamine 10mg  daily for fatigue and feels it helps. She does have off and on insomnia but overall feels that she is sleeping well. Mood is stable. No changes in gait, bowel or bladder habits. She is requesting refill for triamcinolone for eczema.     HISTORY: (copied from Dr Bonnita Hollow note on 03/26/2019)  Cathy [name] is a 54 y.o. woman with MS noting more gait issues.  Update 03/26/2019: She is generally doing well but notes her short term memory is doing worse.    She is on Gilenya and tolerates it well.   She has no exacerbations recently.  Her last MRIs were stable.  She is walking well.   She has no falls but sometimes the right lg seems slightly weak.    She no longer is having any dysesthesias in her hands.   Bladder functon is fine.    She notes some vision issues up close.       She has fatigue.    She feels better with dexedrine.    She has insomnia some nights.   She notes reduced STM and attnention/focus.  She is on dextroamphetamine 10 mg 4 times a day.      Update 09/12/18: She is on Gilenya and tolerates it well.    She has no definite exacerbation but has noted more tingling in her hands, uncomfortable but not painful at times.  Quality is pins/nedles like it fell asleep.   Symptoms are mostly in the forearm and dorsum of the wrist.    The episodes last just a couple minutes but they  occur multiple times a day.   She does not note numbness.    She notes no change in coordination or strength.   She has older mild left arm and right leg weakness.    She notes no change in her bladder.  Vision is slightly worse but no major or asymmetric change.   She has not seen ophthalmology recently.  .  She notes fatigue, helped by dextroamphetamine.   She has insomnia due to difficulty falling asleep and sleep maintenance issues.   Mood is fine.   She has had some decreased focus/atention, helped by Dexedrine.   She needs to write things down some.     MRI cervical spine 06/27/18 showed "There are several T2 hyperintense foci within the spinal cord. These are located posteriorly adjacent to C2-C3, laterally to the left adjacent to C2-C3, laterally to the right adjacent to C6 and posterolaterally to the right adjacent to C7. The foci were present on the 01/29/2016 ".  Also mild spinal stenosis at [facility] and C6C7.  She has C4-C6 fusion.  MRI Brain 06/27/18:   "There are T2/flair hyperintense foci in the hemispheres, left thalamus, left cerebellum and spinal cord in a pattern and configuration consistent with chronic demyelinating plaque associated with multiple sclerosis. None of the  foci appears to be acute. Compared to the MRI dated 06/07/2017, there is no interim change."  Update 06/20/2018 (Virtual): She feels her MS is stable.  She is on Gilenya and she tolerates it well.  We did have a discussion about MS and Gilenya going the COVID-19 pandemic.  The MS itself is probably just a slight risk.  However, Gilenya has been associated with some infections and it is possible that it can lead to a higher risk of COVID-19.  She is socially distancing.  She denies any new MS symptoms or exacerbations.  She continues to have mild issues with her gait with some reduced balance.  Sometimes the right leg gives out.  She notes mildly reduced vision OD since her optic neuritis.  It is stable.   Bladder function is  good.     Fatigue is better on Dexedrine 40 mg qAM.  She has some insomnia.  Melatonin 5 mg only helps some.  She denies any difficulties with mood.  She is still having pain in the right knee since she fell early March 2020.   She was on meloxicam but it had not helped much.   She could not tolerate meloxicam.  Pain is worse after she walks more and when she gets out of bed.   Her knee was swollen at first.   She denies weakness or numbness.       Update 12/20/2017: She is on Gilenya as her DMT and tolerates it well.   No definite exacerbations,.   However, she notes that the right eye vision seems worse.   She notes this especially in dim light.     She had optic neuritis in the past on the right and does note reduced contrast and color vision during the day which is chronic.    Her gait is doing well   She denies any falls.   Her right leg seems to limp more.   Gait is worse when tired or hot.   She has some RLS symptoms in the right leg and it seems to cramp up some.   Moving helps. She has some insomnia due to the leg discomfort.   Gabapentin had a hangover in the past.   She had not tried baclofen or Ropinirole in the past.   Bladder is doing well.  She notes fatigue and some reduced focus/attnetion.     Dexedrine helps her fatigue and attnetion.  However, she still finds learning to be difficult.    Mood is doing well.     Update 05/30/2017: She feels that she has been fairly stable.  She notes no exacerbations or other new MS symptoms.  She is on Gilenya and she tolerates it well.  Her gait is baseline with mildly reduced balance.  She has occasional stumbling but no falls.  She does not note any major problems with weakness or numbness.  She has some urinary urgency and frequency and rare incontinence.  She notes occasioanl constipation and has also had runny stools with 2 episodes of fecal incontinence.   Vision is fine.  Continues to report some fatigue that is both physical and  cognitive.  She does better with Dexedrine and she tolerates it well.  She also has had some difficulty with focus and attention and that is also improved by the Dexedrine.   She denies any problems with mood.   She has some insomnia but notes that if she drinks a beer at bedtime she falls asleep better.  She notes sharp pains in her temple lasting 1 second at times.    Sometimes she notes pain in her back when she takes a deep breath.  Update 11/29/2016:   She feels her MS is stable.   No new numbness, weakness or gait issues.   Balance is still a little off but no falls.    Occasionally, one of her knees will give out but she does not fall.    Bladder is doing well with mild frequency.   Vision is doing ok.     She feels her fatigue is less of a problem on dextroamphetamine.    It also helps her focus and attention.    She denies depression.    She has insomnia (sleep maintenance > sleep onset).    She takes melatonin.  She sometimes feels that she wakes up but can't move.  She does not have much daytime sleepiness.     She occasionally gets sharp pain in her temple lasting seconds (not sure if left or right)..  It occurs less than once a week.   From 01/06/2016:    MS:   She has been on Gilenya since 2013 and is tolerating it well.  She denies any new numbness or weakness in the right arm or either leg.   MRI brain 01/22/2015 showed no acute findings.      Gait/strength/sensation:   Her right leg is dragging a more and it is more noticeable.   She stumbles and has had a few falls.  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    She gets a [employer] sensation in both legs.  Bladder:  She is doing well with minimal frequency.  She has nocturia once a night  Vision:   She feels this is stable.  However,  she had right optic neuritis in the past and still notes right color desaturation and slightly reduced visual acuity.   No eye  pain or diplopia.     Mood/cognition:    She denies any depression or anxiety. She notes cognitive issues, especially attention deficit, mild problems with verbal fluency and processing speed. She notes that cognitive problems have improved on the Dexedrine.has   Fatigue/sleep: She notes fatigue,  worse as the day goes on and with heat. She has a lot of benefit with Dexedrine 20 mg twice a day.   Sometimes, however, she gets much more tired in the evenings. She falls aslee well at night now but had insomnia last year.   Shoulder:    Her shoulder is much better since the shoulder injection at the last visit.    MS History:   She was diagnosed with MS after presenting with optic neuritis x 2 in 2009.   She had an MRI and LP and was diagnosed with MS.     She started on Copaxone and switched to Gilenya in 2013.    She denies any further exacerbation since the optic neuritis but was told she had MS plaques on her spine when she presented last year with a cervical radiculopathy requiring surgery.    I have seen her the past year at [facility].   Her last MRI's were Jan 21, 2014.  MRI images show foci in the cerebellum and the hemispheres consistent with MS. There were no acute findings. She has NIDDM, HTN and high cholesterol.    REVIEW OF SYSTEMS: Out of a complete 14  system review of symptoms, the patient complains only of the following symptoms, headaches, fatigue, weakness right lower extremity and all other reviewed systems are negative.  ALLERGIES: Allergies  Allergen Reactions  . Aspirin     Heartburn  . Ibuprofen     heatburn    HOME MEDICATIONS: Outpatient Medications Prior to Visit  Medication Sig Dispense Refill  . atorvastatin (LIPITOR) 40 MG tablet TAKE 1 TABLET(40 MG) BY MOUTH DAILY AT 6 PM 90 tablet 3  . Cholecalciferol (VITAMIN D) 2000 UNITS CAPS Take 2,000 Units by mouth daily.    Marland Kitchen dextroamphetamine (DEXEDRINE) 10 MG 24 hr capsule Take 2-4 capsules (20-40 mg  total) by mouth daily. 120 capsule 0  . diclofenac Sodium (VOLTAREN) 1 % GEL Apply topically.    . Fingolimod HCl (GILENYA) 0.5 MG CAPS TAKE ONE CAPSULE (0.5 MG) BY MOUTH EACH DAY. MAY TAKE WITH OR WITHOUTFOOD. STORE AT ROOM TEMPERATURE. 90 capsule 3  . fluconazole (DIFLUCAN) 150 MG tablet Take 1 tablet (150 mg total) by mouth daily. Take second tab 3days apart from first tab 2 tablet 0  . loratadine (CLARITIN) 10 MG tablet Take 10 mg by mouth daily.    Marland Kitchen losartan-hydrochlorothiazide (HYZAAR) 50-12.5 MG tablet Take 1 tablet by mouth daily. 90 tablet 3  . Semaglutide, 1 MG/DOSE, (OZEMPIC, 1 MG/DOSE,) 2 MG/1.5ML SOPN Inject 0.75 mLs (1 mg total) into the skin once a week. 2 pen 2  . SitaGLIPtin-MetFORMIN HCl (JANUMET XR) [phone] MG TB24 Take 1 tablet by mouth daily after breakfast. 90 tablet 1  . imipramine (TOFRANIL) 25 MG tablet TAKE 1 TABLET(25 MG) BY MOUTH AT BEDTIME 30 tablet 5  . triamcinolone cream (KENALOG) 0.1 % Apply topically 3 (three) times daily. 45 g 1  . gadopentetate dimeglumine (MAGNEVIST) injection 20 mL      No facility-administered medications prior to visit.    PAST MEDICAL HISTORY: Past Medical History:  Diagnosis Date  . Diabetes mellitus without complication (HCC)   . Eczema   . Hypertension   . Multiple sclerosis (HCC)   . Vision abnormalities     PAST SURGICAL HISTORY: Past Surgical History:  Procedure Laterality Date  . ABDOMINAL HYSTERECTOMY    . CARPAL TUNNEL RELEASE    . CERVICAL SPINE SURGERY     Dr. Sharolyn Douglas  . LASER ABLATION    . TENDON REPAIR Left    wrist surgery  . TUBAL LIGATION      FAMILY HISTORY: Family History  Problem Relation Age of Onset  . Hypertension Mother   . Diabetes type II Mother   . COPD Father   . Multiple sclerosis Paternal Uncle   . Multiple sclerosis Paternal Grandfather     SOCIAL HISTORY: Social History   Socioeconomic History  . Marital status: Married    Spouse name: Anothony  . Number of children: 3    . Years of education: 12+  . Highest education level: Not on file  Occupational History  . Not on file  Tobacco Use  . Smoking status: Never Smoker  . Smokeless tobacco: Never Used  Vaping Use  . Vaping Use: Never used  Substance and Sexual Activity  . Alcohol use: Yes    Alcohol/week: 1.0 standard drink    Types: 1 Glasses of wine per week    Comment: occasionally  . Drug use: No  . Sexual activity: Yes    Birth control/protection: Surgical  Other Topics Concern  . Not on file  Social History Narrative  Right handed    Caffeine YQI:HKVQ sometimes.   Lives with husband,  Ethelene Browns   Social Determinants of Health   Financial Resource Strain:   . Difficulty of Paying Living Expenses: Not on file  Food Insecurity:   . Worried About Programme researcher, broadcasting/film/video in the Last Year: Not on file  . Ran Out of Food in the Last Year: Not on file  Transportation Needs:   . Lack of Transportation (Medical): Not on file  . Lack of Transportation (Non-Medical): Not on file  Physical Activity:   . Days of Exercise per Week: Not on file  . Minutes of Exercise per Session: Not on file  Stress:   . Feeling of Stress : Not on file  Social Connections:   . Frequency of Communication with Friends and Family: Not on file  . Frequency of Social Gatherings with Friends and Family: Not on file  . Attends Religious Services: Not on file  . Active Member of Clubs or Organizations: Not on file  . Attends Banker Meetings: Not on file  . Marital Status: Not on file  Intimate Partner Violence:   . Fear of Current or Ex-Partner: Not on file  . Emotionally Abused: Not on file  . Physically Abused: Not on file  . Sexually Abused: Not on file      PHYSICAL EXAM  Vitals:   09/26/19 1542  BP: 134/83  Pulse: (!) 116  Weight: 210 lb (95.3 kg)  Height:  (1.651 m)   Body mass index is 34.95 kg/m.  Generalized: Well developed, in no acute distress  Cardiology: normal rate and  rhythm, no murmur noted Respiratory: clear to auscultation bilaterally  Neurological examination  Mentation: Alert oriented to time, place, history taking. Follows all commands speech and language fluent Cranial nerve II-XII: Pupils were equal round reactive to light. Extraocular movements were full, visual field were full on confrontational test. Facial sensation and strength were normal. Uvula tongue midline. Head turning and shoulder shrug  were normal and symmetric. Motor: The motor testing reveals 5 over 5 strength of all 4 extremities, with exception of right hip flexion. Good symmetric motor tone is noted throughout.  Sensory: Sensory testing is intact to soft touch on all 4 extremities. No evidence of extinction is noted.  Coordination: Cerebellar testing reveals good finger-nose-finger and mildly reduced right heel-to-shin.  Gait and station: Gait is mildly ataxic, slight foot drop of right foot.  Reflexes: Deep tendon reflexes are symmetric and normal bilaterally.   DIAGNOSTIC DATA (LABS, IMAGING, TESTING) - I reviewed patient records, labs, notes, testing and imaging myself where available.  No flowsheet data found.   Lab Results  Component Value Date   WBC 3.9 (L) 04/05/2019   HGB 12.5 04/05/2019   HCT 36.6 04/05/2019   MCV 87.1 04/05/2019   PLT 306.0 04/05/2019      Component Value Date/Time   NA 132 (L) 07/15/2019 0956   NA 143 09/12/2018 1209   K 4.0 07/15/2019 0956   CL 96 07/15/2019 0956   CO2 25 07/15/2019 0956   GLUCOSE 361 (H) 07/15/2019 0956   BUN 13 07/15/2019 0956   BUN 10 09/12/2018 1209   CREATININE 0.72 07/15/2019 0956   CALCIUM 9.9 07/15/2019 0956   PROT 6.9 02/11/2019 1001   PROT 6.5 09/12/2018 1209   ALBUMIN 4.2 02/11/2019 1001   ALBUMIN 4.5 09/12/2018 1209   AST 18 02/11/2019 1001   ALT 24 02/11/2019 1001   ALKPHOS 114  02/11/2019 1001   BILITOT 0.5 02/11/2019 1001   BILITOT 0.4 09/12/2018 1209   GFRNONAA 90 09/12/2018 1209   GFRAA 104  09/12/2018 1209   Lab Results  Component Value Date   CHOL 145 02/11/2019   HDL 49.80 02/11/2019   LDLCALC 73 02/11/2019   TRIG 114.0 02/11/2019   CHOLHDL 3 02/11/2019   Lab Results  Component Value Date   HGBA1C 10.4 (H) 07/15/2019   Lab Results  Component Value Date   VITAMINB12 363 08/27/2009   Lab Results  Component Value Date   TSH 3.01 02/11/2019       ASSESSMENT AND PLAN 54 y.o. year old female  has a past medical history of Diabetes mellitus without complication (HCC), Eczema, Hypertension, Multiple sclerosis (HCC), and Vision abnormalities. here with     ICD-10-CM   1. Multiple sclerosis (HCC)  G35 CBC with Differential/Platelets    CMP  2. Other fatigue  R53.83   3. Chronic nonintractable headache, unspecified headache type  R51.9    G89.29     Fenix is doing well, today. She will continue current treatment plan. MRI stable last year. We will update labs. I have encouraged healthy lifestyle habits. She will continue close follow up with PCP. Importance of diabetes management reviewed. She will follow up with [place] in 6 months, sooner if needed. She verbalizes understanding and agreement with this plan.    Orders Placed This Encounter  Procedures  . CBC with Differential/Platelets  . CMP     Meds ordered this encounter  Medications  . imipramine (TOFRANIL) 25 MG tablet    Sig: TAKE 1 TABLET(25 MG) BY MOUTH AT BEDTIME    Dispense:  30 tablet    Refill:  5    Order Specific Question:   Supervising Provider    Answer:   Anson Fret [id number]  . triamcinolone cream (KENALOG) 0.1 %    Sig: Apply topically 3 (three) times daily.    Dispense:  45 g    Refill:  1    Order Specific Question:   Supervising Provider    Answer:   Anson Fret [id number]      I spent 30 minutes with the patient. 50% of this time was spent counseling and educating patient on plan of care and medications.     Shawnie Dapper, FNP-C 09/26/2019, 4:07 PM [facility] [address] [place], [place] [address] [phone]

## 2019-09-27 LAB — COMPREHENSIVE METABOLIC PANEL
ALT: 33 IU/L — ABNORMAL HIGH (ref 0–32)
AST: 21 IU/L (ref 0–40)
Albumin/Globulin Ratio: 1.8 (ref 1.2–2.2)
Albumin: 4.3 g/dL (ref 3.8–4.9)
Alkaline Phosphatase: 144 IU/L — ABNORMAL HIGH (ref 48–121)
BUN/Creatinine Ratio: 12 (ref 9–23)
BUN: 9 mg/dL (ref 6–24)
Bilirubin Total: 0.5 mg/dL (ref 0.0–1.2)
CO2: 27 mmol/L (ref 20–29)
Calcium: 10.1 mg/dL (ref 8.7–10.2)
Chloride: 99 mmol/L (ref 96–106)
Creatinine, Ser: 0.75 mg/dL (ref 0.57–1.00)
GFR calc Af Amer: 105 mL/min/{1.73_m2} (ref >59)
GFR calc non Af Amer: 91 mL/min/{1.73_m2} (ref >59)
Globulin, Total: 2.4 g/dL (ref 1.5–4.5)
Glucose: 175 mg/dL — ABNORMAL HIGH (ref 65–99)
Potassium: 3.9 mmol/L (ref 3.5–5.2)
Sodium: 140 mmol/L (ref 134–144)
Total Protein: 6.7 g/dL (ref 6.0–8.5)

## 2019-09-27 LAB — CBC WITH DIFFERENTIAL/PLATELET
Basophils Absolute: 0 10*3/uL (ref 0.0–0.2)
Basos: 1 %
EOS (ABSOLUTE): 0.1 10*3/uL (ref 0.0–0.4)
Eos: 3 %
Hematocrit: 35.8 % (ref 34.0–46.6)
Hemoglobin: 12.4 g/dL (ref 11.1–15.9)
Immature Grans (Abs): 0 10*3/uL (ref 0.0–0.1)
Immature Granulocytes: 0 %
Lymphocytes Absolute: 0.4 10*3/uL — ABNORMAL LOW (ref 0.7–3.1)
Lymphs: 13 %
MCH: 30 pg (ref 26.6–33.0)
MCHC: 34.6 g/dL (ref 31.5–35.7)
MCV: 87 fL (ref 79–97)
Monocytes Absolute: 0.3 10*3/uL (ref 0.1–0.9)
Monocytes: 10 %
Neutrophils Absolute: 2.3 10*3/uL (ref 1.4–7.0)
Neutrophils: 73 %
Platelets: 399 10*3/uL (ref 150–450)
RBC: 4.14 x10E6/uL (ref 3.77–5.28)
RDW: 13.6 % (ref 11.7–15.4)
WBC: 3.2 10*3/uL — ABNORMAL LOW (ref 3.4–10.8)

## 2019-10-03 ENCOUNTER — Other Ambulatory Visit: Payer: Self-pay | Admitting: Family Medicine

## 2019-10-03 MED ORDER — DEXTROAMPHETAMINE SULFATE ER 10 MG PO CP24: 20.0000 mg | ORAL_CAPSULE | Freq: Every day | ORAL | 0 refills | Status: DC

## 2019-10-03 NOTE — Addendum Note (Signed)
Addended by: Arther Abbott on: 10/03/2019 09:26 AM   Modules accepted: Orders

## 2019-10-03 NOTE — Telephone Encounter (Signed)
Pt is requesting a refill for dextroamphetamine (DEXEDRINE) 10 MG 24 hr capsule.  Pharmacy: [id number] -   

## 2019-10-07 ENCOUNTER — Encounter: Payer: Self-pay | Admitting: Nurse Practitioner

## 2019-10-07 ENCOUNTER — Ambulatory Visit: Payer: Federal, State, Local not specified - PPO | Admitting: Nurse Practitioner

## 2019-10-07 ENCOUNTER — Other Ambulatory Visit: Payer: Self-pay

## 2019-10-07 VITALS — BP 138/76 | HR 84 | Temp 97.0°F | Ht 65.5 in | Wt 209.6 lb

## 2019-10-07 DIAGNOSIS — I10 Essential (primary) hypertension: Secondary | ICD-10-CM

## 2019-10-07 DIAGNOSIS — Z23 Encounter for immunization: Secondary | ICD-10-CM

## 2019-10-07 DIAGNOSIS — E1165 Type 2 diabetes mellitus with hyperglycemia: Secondary | ICD-10-CM | POA: Diagnosis not present

## 2019-10-07 LAB — HEMOGLOBIN A1C: Hgb A1c MFr Bld: 7 % — ABNORMAL HIGH (ref 4.6–6.5)

## 2019-10-07 MED ORDER — OZEMPIC (1 MG/DOSE) 2 MG/1.5ML ~~LOC~~ SOPN: 1.0000 mg | PEN_INJECTOR | SUBCUTANEOUS | 2 refills | Status: DC

## 2019-10-07 NOTE — Progress Notes (Signed)
Subjective:  Patient ID: Cathy [name], female    DOB: 1965-07-15  Age: 54 y.o. MRN: [id number]  CC: Follow-up (6 month f/u on DM, HTN, and Hyperlipidemia. Pt states blood sugars have been ranging between 112-140, pt has not checked BPs, Pt is fasting)  HPI  DM (diabetes mellitus) (HCC) Improved glucose control with ozempic injection. Denies any adverse side effects She discontinued janumet due to glucose <140  Improved hgbA1c 10 to 7.0 Continue ozempic 1mg  weekly D/c janumet. F/up in 54months  Essential (primary) hypertension BP at goal with losartan/HCTZ BP Readings from Last 3 Encounters:  10/07/19 138/76  09/26/19 134/83  08/15/19 (!) 106/64   Maintain current medications  Wt Readings from Last 3 Encounters:  10/07/19 209 lb 9.6 oz (95.1 kg)  09/26/19 210 lb (95.3 kg)  08/15/19 (!) 211 lb (95.7 kg)   BP Readings from Last 3 Encounters:  10/07/19 138/76  09/26/19 134/83  08/15/19 (!) 106/64   Reviewed past Medical, Social and Family history today.  Outpatient Medications Prior to Visit  Medication Sig Dispense Refill   atorvastatin (LIPITOR) 40 MG tablet TAKE 1 TABLET(40 MG) BY MOUTH DAILY AT 6 PM 90 tablet 3   Cholecalciferol (VITAMIN D) 2000 UNITS CAPS Take 2,000 Units by mouth daily.     dextroamphetamine (DEXEDRINE) 10 MG 24 hr capsule Take 2-4 capsules (20-40 mg total) by mouth daily. 120 capsule 0   diclofenac Sodium (VOLTAREN) 1 % GEL Apply topically.     Fingolimod HCl (GILENYA) 0.5 MG CAPS TAKE ONE CAPSULE (0.5 MG) BY MOUTH EACH DAY. MAY TAKE WITH OR WITHOUTFOOD. STORE AT ROOM TEMPERATURE. 90 capsule 3   imipramine (TOFRANIL) 25 MG tablet TAKE 1 TABLET(25 MG) BY MOUTH AT BEDTIME 30 tablet 5   loratadine (CLARITIN) 10 MG tablet Take 10 mg by mouth daily.     losartan-hydrochlorothiazide (HYZAAR) 50-12.5 MG tablet Take 1 tablet by mouth daily. 90 tablet 3   triamcinolone cream (KENALOG) 0.1 % Apply topically 3 (three) times daily. 45 g  1   Semaglutide, 1 MG/DOSE, (OZEMPIC, 1 MG/DOSE,) 2 MG/1.5ML SOPN Inject 0.75 mLs (1 mg total) into the skin once a week. 2 pen 2   fluconazole (DIFLUCAN) 150 MG tablet Take 1 tablet (150 mg total) by mouth daily. Take second tab 3days apart from first tab (Patient not taking: Reported on 10/07/2019) 2 tablet 0   SitaGLIPtin-MetFORMIN HCl (JANUMET XR) [phone] MG TB24 Take 1 tablet by mouth daily after breakfast. (Patient not taking: Reported on 10/07/2019) 90 tablet 1   No facility-administered medications prior to visit.    ROS See HPI  Objective:  BP 138/76 (BP Location: Left Arm, Patient Position: Sitting, Cuff Size: Normal)    Pulse 84    Temp (!) 97 F (36.1 C) (Temporal)    Ht 5' 5.5" (1.664 m)    Wt 209 lb 9.6 oz (95.1 kg)    SpO2 98%    BMI 34.35 kg/m   Physical Exam Vitals reviewed.  Cardiovascular:     Rate and Rhythm: Normal rate and regular rhythm.     Pulses: Normal pulses.     Heart sounds: Normal heart sounds.  Pulmonary:     Effort: Pulmonary effort is normal.     Breath sounds: Normal breath sounds.  Musculoskeletal:     Right lower leg: No edema.     Left lower leg: No edema.  Neurological:     Mental Status: She is alert and oriented to person, place, and  time.     Assessment & Plan:  This visit occurred during the SARS-CoV-2 public health emergency.  Safety protocols were in place, including screening questions prior to the visit, additional usage of staff PPE, and extensive cleaning of exam room while observing appropriate contact time as indicated for disinfecting solutions.   Acasia was seen today for follow-up.  Diagnoses and all orders for this visit:  Type 2 diabetes mellitus with hyperglycemia, without long-term current use of insulin (HCC) -     Cancel: POCT glycosylated hemoglobin (Hb A1C) -     Hemoglobin A1c -     Semaglutide, 1 MG/DOSE, (OZEMPIC, 1 MG/DOSE,) 2 MG/1.5ML SOPN; Inject 1 mg into the skin once a week.  Essential (primary)  hypertension  Influenza vaccine needed -     Flu Vaccine QUAD High Dose(Fluad)    Problem List Items Addressed This Visit      Cardiovascular and Mediastinum   Essential (primary) hypertension    BP at goal with losartan/HCTZ BP Readings from Last 3 Encounters:  10/07/19 138/76  09/26/19 134/83  08/15/19 (!) 106/64   Maintain current medications        Endocrine   DM (diabetes mellitus) (HCC) - Primary    Improved glucose control with ozempic injection. Denies any adverse side effects She discontinued janumet due to glucose <140  Improved hgbA1c 10 to 7.0 Continue ozempic 1mg  weekly D/c janumet. F/up in 47months      Relevant Medications   Semaglutide, 1 MG/DOSE, (OZEMPIC, 1 MG/DOSE,) 2 MG/1.5ML SOPN   Other Relevant Orders   Hemoglobin A1c (Completed)    Other Visit Diagnoses    Influenza vaccine needed       Relevant Orders   Flu Vaccine QUAD High Dose(Fluad) (Completed)      Follow-up: Return in about 6 months (around 04/05/2020) for CPE (fasting).  04/07/2020, NP

## 2019-10-07 NOTE — Assessment & Plan Note (Addendum)
Improved glucose control with ozempic injection. Denies any adverse side effects She discontinued janumet due to glucose <140  Improved hgbA1c 10 to 7.0 Continue ozempic 1mg  weekly D/c janumet. F/up in 15months

## 2019-10-07 NOTE — Patient Instructions (Signed)
Call office and schedule appt with GI for colonoscopy  Go to lab for repeat A1c.

## 2019-10-07 NOTE — Assessment & Plan Note (Signed)
BP at goal with losartan/HCTZ BP Readings from Last 3 Encounters:  10/07/19 138/76  09/26/19 134/83  08/15/19 (!) 106/64   Maintain current medications

## 2019-10-08 ENCOUNTER — Telehealth: Payer: Self-pay | Admitting: *Deleted

## 2019-10-08 NOTE — Telephone Encounter (Signed)
-----   Message from Shawnie Dapper, NP sent at 10/08/2019 12:25 PM EDT ----- Please let her know that her labs look stable. We will continue monitoring every 6 months.

## 2019-10-08 NOTE — Telephone Encounter (Signed)
I called pt and let her know that per Amy/NP labs looked stable.  Will monitor every 6 months.  She verbalized understanding.

## 2019-10-30 ENCOUNTER — Other Ambulatory Visit: Payer: Self-pay | Admitting: Family Medicine

## 2019-10-30 NOTE — Telephone Encounter (Signed)
Pt is needing a refill on her dextroamphetamine (DEXEDRINE) 10 MG 24 hr capsule sent in to the [facility] in [place] on [address]

## 2019-10-31 MED ORDER — DEXTROAMPHETAMINE SULFATE ER 10 MG PO CP24: 20.0000 mg | ORAL_CAPSULE | Freq: Every day | ORAL | 0 refills | Status: DC

## 2019-12-03 ENCOUNTER — Other Ambulatory Visit: Payer: Self-pay | Admitting: Family Medicine

## 2019-12-03 MED ORDER — DEXTROAMPHETAMINE SULFATE ER 10 MG PO CP24: 20.0000 mg | ORAL_CAPSULE | Freq: Every day | ORAL | 0 refills | Status: DC

## 2019-12-03 NOTE — Telephone Encounter (Signed)
Pt request refill dextroamphetamine (DEXEDRINE) 10 MG 24 hr capsule at [id number] 

## 2019-12-03 NOTE — Addendum Note (Signed)
Addended byNicholas Lose, Evangelyne Loja L on: 12/03/2019 11:20 AM   Modules accepted: Orders

## 2020-01-02 ENCOUNTER — Other Ambulatory Visit: Payer: Self-pay | Admitting: Family Medicine

## 2020-01-02 MED ORDER — DEXTROAMPHETAMINE SULFATE ER 10 MG PO CP24: 20.0000 mg | ORAL_CAPSULE | Freq: Every day | ORAL | 0 refills | Status: DC

## 2020-01-02 NOTE — Telephone Encounter (Signed)
Pt is needing a refill on her dextroamphetamine (DEXEDRINE) 10 MG 24 hr capsule sent to the [facility] on [address] [place].

## 2020-01-09 ENCOUNTER — Other Ambulatory Visit: Payer: Self-pay | Admitting: Nurse Practitioner

## 2020-01-09 DIAGNOSIS — E1165 Type 2 diabetes mellitus with hyperglycemia: Secondary | ICD-10-CM

## 2020-01-09 NOTE — Telephone Encounter (Signed)
Last OV 10/07/19 Last fill 10/07/19  #90ml/2

## 2020-01-20 ENCOUNTER — Telehealth: Payer: Self-pay

## 2020-01-20 NOTE — Telephone Encounter (Signed)
I have sent PA request for Gilenya on [facility], Key: [id number] - Rx #: [id number].  Awaiting follow up questions

## 2020-01-22 NOTE — Telephone Encounter (Signed)
Received this message on CMM, "To initiate an authorization request for this medication, please contact Federal Employee Program (FEP) at [phone]."  I have completed paper PA form specifically for the [facility] FEP plan and faxed it with chart notes to [phone].

## 2020-01-22 NOTE — Telephone Encounter (Signed)
APPROVED.  Effective dates: 12/23/2019-01/21/2021.

## 2020-01-29 ENCOUNTER — Telehealth: Payer: Self-pay | Admitting: Family Medicine

## 2020-01-29 DIAGNOSIS — G35 Multiple sclerosis: Secondary | ICD-10-CM

## 2020-01-29 MED ORDER — GILENYA 0.5 MG PO CAPS: ORAL_CAPSULE | ORAL | 3 refills | Status: DC

## 2020-01-29 MED ORDER — DEXTROAMPHETAMINE SULFATE ER 10 MG PO CP24: 20.0000 mg | ORAL_CAPSULE | Freq: Every day | ORAL | 0 refills | Status: DC

## 2020-01-29 NOTE — Addendum Note (Signed)
Addended by: Guy Begin on: 01/29/2020 02:15 PM   Modules accepted: Orders

## 2020-01-29 NOTE — Telephone Encounter (Signed)
Pt called to inform that her Fingolimod HCl (GILENYA) 0.5 MG CAPS will expire on 02-25 Also pt asking her dextroamphetamine (DEXEDRINE) 10 MG 24 hr capsule be called into [facility] [phone]

## 2020-01-29 NOTE — Addendum Note (Signed)
Addended by: Shawnie Dapper L on: 01/29/2020 02:32 PM   Modules accepted: Orders

## 2020-02-06 ENCOUNTER — Telehealth: Payer: Self-pay | Admitting: Family Medicine

## 2020-02-06 NOTE — Telephone Encounter (Signed)
Pt. called to see about her getting FMLA paperwork filled out for this year. Please advise.

## 2020-02-11 NOTE — Telephone Encounter (Signed)
I called pt and and she wanted FMLA p/w filled out, I gave her MR #.  She is having muscle spasms, legs, L foot, and L foot swelling, back pain and mental clarity is decreased.  She will fax to [place].  She has appt with Dr. Epimenio Foot 03-31-20.  Wanted [place] to be aware.  Did not feel like exacerbation.

## 2020-02-11 NOTE — Telephone Encounter (Signed)
Pt called, would like to speak with the nurse regarding FLMA paperwork for 2022.

## 2020-02-14 ENCOUNTER — Ambulatory Visit: Payer: Federal, State, Local not specified - PPO | Admitting: Nurse Practitioner

## 2020-02-18 ENCOUNTER — Other Ambulatory Visit: Payer: Self-pay | Admitting: Nurse Practitioner

## 2020-02-18 DIAGNOSIS — I10 Essential (primary) hypertension: Secondary | ICD-10-CM

## 2020-02-24 ENCOUNTER — Ambulatory Visit: Payer: Federal, State, Local not specified - PPO | Admitting: Nurse Practitioner

## 2020-02-24 ENCOUNTER — Other Ambulatory Visit: Payer: Self-pay

## 2020-02-24 ENCOUNTER — Encounter: Payer: Self-pay | Admitting: Nurse Practitioner

## 2020-02-24 VITALS — BP 126/84 | HR 106 | Temp 97.3°F | Wt 213.0 lb

## 2020-02-24 DIAGNOSIS — E1165 Type 2 diabetes mellitus with hyperglycemia: Secondary | ICD-10-CM | POA: Diagnosis not present

## 2020-02-24 DIAGNOSIS — E782 Mixed hyperlipidemia: Secondary | ICD-10-CM | POA: Diagnosis not present

## 2020-02-24 DIAGNOSIS — I10 Essential (primary) hypertension: Secondary | ICD-10-CM

## 2020-02-24 LAB — BASIC METABOLIC PANEL
BUN: 9 mg/dL (ref 6–23)
CO2: 30 mEq/L (ref 19–32)
Calcium: 9.8 mg/dL (ref 8.4–10.5)
Chloride: 101 mEq/L (ref 96–112)
Creatinine, Ser: 0.71 mg/dL (ref 0.40–1.20)
GFR: 96.52 mL/min (ref >60.00)
Glucose, Bld: 144 mg/dL — ABNORMAL HIGH (ref 70–99)
Potassium: 3.6 mEq/L (ref 3.5–5.1)
Sodium: 139 mEq/L (ref 135–145)

## 2020-02-24 LAB — LIPID PANEL
Cholesterol: 144 mg/dL (ref 0–200)
HDL: 51.1 mg/dL (ref >39.00)
LDL Cholesterol: 73 mg/dL (ref 0–99)
NonHDL: 93.1
Total CHOL/HDL Ratio: 3
Triglycerides: 103 mg/dL (ref 0.0–149.0)
VLDL: 20.6 mg/dL (ref 0.0–40.0)

## 2020-02-24 LAB — HEMOGLOBIN A1C: Hgb A1c MFr Bld: 6.6 % — ABNORMAL HIGH (ref 4.6–6.5)

## 2020-02-24 MED ORDER — OZEMPIC (1 MG/DOSE) 4 MG/3ML ~~LOC~~ SOPN: 1.0000 mg | PEN_INJECTOR | SUBCUTANEOUS | 2 refills | Status: DC

## 2020-02-24 MED ORDER — LOSARTAN POTASSIUM-HCTZ 50-12.5 MG PO TABS: 1.0000 | ORAL_TABLET | Freq: Every day | ORAL | 3 refills | Status: DC

## 2020-02-24 MED ORDER — ATORVASTATIN CALCIUM 40 MG PO TABS: ORAL_TABLET | ORAL | 3 refills | Status: DC

## 2020-02-24 NOTE — Assessment & Plan Note (Addendum)
Does not check glucose at goal Current use of ozempic with no adverse side effects  Labs complted today:normal renal function and HgbA1c of 6.6 Maintain ozempic dose Check glucose 3x/week before breakfast or 2hrs after. Need to maintain exercise regimen at least 3x/week, 30-56mins.

## 2020-02-24 NOTE — Progress Notes (Signed)
Subjective:  Patient ID: Cathy [name], female    DOB: [birth year]  Age: 55 y.o. MRN: [id number]  CC: Follow-up (4 month f/u on DM, Pt is fasting)  HPI  Essential (primary) hypertension BP at goal with losartan/hctz BP Readings from Last 3 Encounters:  02/24/20 126/84  10/07/19 138/76  09/26/19 134/83   Repeat BMP: normal maintain current medications dose  DM (diabetes mellitus) (HCC) Does not check glucose at goal Current use of ozempic with no adverse side effects  Labs complted today:normal renal function and HgbA1c of 6.6 Maintain ozempic dose Check glucose 3x/week before breakfast or 2hrs after. Need to maintain exercise regimen at least 3x/week, 30-86mins.  Wt Readings from Last 3 Encounters:  02/24/20 213 lb (96.6 kg)  10/07/19 209 lb 9.6 oz (95.1 kg)  09/26/19 210 lb (95.3 kg)   Reviewed past Medical, Social and Family history today.  Outpatient Medications Prior to Visit  Medication Sig Dispense Refill  . Cholecalciferol (VITAMIN D) 2000 UNITS CAPS Take 2,000 Units by mouth daily.    Marland Kitchen dextroamphetamine (DEXEDRINE) 10 MG 24 hr capsule Take 2-4 capsules (20-40 mg total) by mouth daily. 120 capsule 0  . diclofenac Sodium (VOLTAREN) 1 % GEL Apply topically.    . Fingolimod HCl (GILENYA) 0.5 MG CAPS TAKE ONE CAPSULE (0.5 MG) BY MOUTH EACH DAY. MAY TAKE WITH OR WITHOUTFOOD. STORE AT ROOM TEMPERATURE. 90 capsule 3  . imipramine (TOFRANIL) 25 MG tablet TAKE 1 TABLET(25 MG) BY MOUTH AT BEDTIME 30 tablet 5  . loratadine (CLARITIN) 10 MG tablet Take 10 mg by mouth daily.    Marland Kitchen triamcinolone cream (KENALOG) 0.1 % Apply topically 3 (three) times daily. 45 g 1  . atorvastatin (LIPITOR) 40 MG tablet TAKE 1 TABLET(40 MG) BY MOUTH DAILY AT 6 PM 90 tablet 3  . losartan-hydrochlorothiazide (HYZAAR) 50-12.5 MG tablet Take 1 tablet by mouth daily. 90 tablet 3  . OZEMPIC, 1 MG/DOSE, 4 MG/3ML SOPN ADMINISTER 1 MG UNDER THE SKIN 1 TIME A WEEK 3 mL 2  . Semaglutide, 1  MG/DOSE, (OZEMPIC, 1 MG/DOSE,) 2 MG/1.5ML SOPN Inject 1 mg into the skin once a week. 3 mL 2   No facility-administered medications prior to visit.    ROS See HPI  Objective:  BP 126/84 (BP Location: Left Arm, Patient Position: Sitting, Cuff Size: Normal)   Pulse (!) 106   Temp (!) 97.3 F (36.3 C) (Temporal)   Wt 213 lb (96.6 kg)   SpO2 98%   BMI 34.91 kg/m   Physical Exam Vitals reviewed.  Cardiovascular:     Rate and Rhythm: Normal rate and regular rhythm.     Pulses: Normal pulses.     Heart sounds: Normal heart sounds.  Pulmonary:     Effort: Pulmonary effort is normal.     Breath sounds: Normal breath sounds.  Musculoskeletal:     Right lower leg: No edema.     Left lower leg: No edema.  Skin:    General: Skin is warm and dry.  Neurological:     Mental Status: She is alert and oriented to person, place, and time.  Psychiatric:        Mood and Affect: Mood normal.        Behavior: Behavior normal.        Thought Content: Thought content normal.     Assessment & Plan:  This visit occurred during the SARS-CoV-2 public health emergency.  Safety protocols were in place, including screening questions prior to  the visit, additional usage of staff PPE, and extensive cleaning of exam room while observing appropriate contact time as indicated for disinfecting solutions.   Cathy [name] was seen today for follow-up.  Diagnoses and all orders for this visit:  Essential (primary) hypertension -     Basic metabolic panel -     losartan-hydrochlorothiazide (HYZAAR) 50-12.5 MG tablet; Take 1 tablet by mouth daily.  Type 2 diabetes mellitus with hyperglycemia, without long-term current use of insulin (HCC) -     Hemoglobin A1c -     Basic metabolic panel -     Semaglutide, 1 MG/DOSE, (OZEMPIC, 1 MG/DOSE,) 4 MG/3ML SOPN; Inject 1 mg into the skin once a week.  Mixed hyperlipidemia -     Lipid panel -     atorvastatin (LIPITOR) 40 MG tablet; TAKE 1 TABLET(40 MG) BY MOUTH DAILY  AT 6 PM    Problem List Items Addressed This Visit      Cardiovascular and Mediastinum   Essential (primary) hypertension - Primary    BP at goal with losartan/hctz BP Readings from Last 3 Encounters:  02/24/20 126/84  10/07/19 138/76  09/26/19 134/83   Repeat BMP: normal maintain current medications dose      Relevant Medications   atorvastatin (LIPITOR) 40 MG tablet   losartan-hydrochlorothiazide (HYZAAR) 50-12.5 MG tablet   Other Relevant Orders   Basic metabolic panel (Completed)     Endocrine   DM (diabetes mellitus) (HCC)    Does not check glucose at goal Current use of ozempic with no adverse side effects  Labs complted today:normal renal function and HgbA1c of 6.6 Maintain ozempic dose Check glucose 3x/week before breakfast or 2hrs after. Need to maintain exercise regimen at least 3x/week, 30-58mins.      Relevant Medications   atorvastatin (LIPITOR) 40 MG tablet   losartan-hydrochlorothiazide (HYZAAR) 50-12.5 MG tablet   Semaglutide, 1 MG/DOSE, (OZEMPIC, 1 MG/DOSE,) 4 MG/3ML SOPN   Other Relevant Orders   Hemoglobin A1c (Completed)   Basic metabolic panel (Completed)     Other   HLD (hyperlipidemia)   Relevant Medications   atorvastatin (LIPITOR) 40 MG tablet   losartan-hydrochlorothiazide (HYZAAR) 50-12.5 MG tablet   Other Relevant Orders   Lipid panel (Completed)      Follow-up: Return in about 3 months (around 05/23/2020) for DM and HTN (video, ).  Alysia Penna, NP

## 2020-02-24 NOTE — Assessment & Plan Note (Signed)
BP at goal with losartan/hctz BP Readings from Last 3 Encounters:  02/24/20 126/84  10/07/19 138/76  09/26/19 134/83   Repeat BMP: normal maintain current medications dose

## 2020-02-24 NOTE — Patient Instructions (Addendum)
No known report of weight gain with gilenya.  Go to lab for blood draw  Check glucose 3x/week before breakfast or 2hrs after.  Need to maintain exercise regimen at least 3x/week, 30-32mins.  Start following daily supplements over the counter: B-complex 1tab daily and vitamin D 1000IU daily.  Diabetes Mellitus and Foot Care Foot care is an important part of your health, especially when you have diabetes. Diabetes may cause you to have problems because of poor blood flow (circulation) to your feet and legs, which can cause your skin to:  Become thinner and drier.  Break more easily.  Heal more slowly.  Peel and crack. You may also have nerve damage (neuropathy) in your legs and feet, causing decreased feeling in them. This means that you may not notice minor injuries to your feet that could lead to more serious problems. Noticing and addressing any potential problems early is the best way to prevent future foot problems. How to care for your feet Foot hygiene  Wash your feet daily with warm water and mild soap. Do not use hot water. Then, pat your feet and the areas between your toes until they are completely dry. Do not soak your feet as this can dry your skin.  Trim your toenails straight across. Do not dig under them or around the cuticle. File the edges of your nails with an emery board or nail file.  Apply a moisturizing lotion or petroleum jelly to the skin on your feet and to dry, brittle toenails. Use lotion that does not contain alcohol and is unscented. Do not apply lotion between your toes.   Shoes and socks  Wear clean socks or stockings every day. Make sure they are not too tight. Do not wear knee-high stockings since they may decrease blood flow to your legs.  Wear shoes that fit properly and have enough cushioning. Always look in your shoes before you put them on to be sure there are no objects inside.  To break in new shoes, wear them for just a few hours a day. This  prevents injuries on your feet. Wounds, scrapes, corns, and calluses  Check your feet daily for blisters, cuts, bruises, sores, and redness. If you cannot see the bottom of your feet, use a mirror or ask someone for help.  Do not cut corns or calluses or try to remove them with medicine.  If you find a minor scrape, cut, or break in the skin on your feet, keep it and the skin around it clean and dry. You may clean these areas with mild soap and water. Do not clean the area with peroxide, alcohol, or iodine.  If you have a wound, scrape, corn, or callus on your foot, look at it several times a day to make sure it is healing and not infected. Check for: ? Redness, swelling, or pain. ? Fluid or blood. ? Warmth. ? Pus or a bad smell.   General tips  Do not cross your legs. This may decrease blood flow to your feet.  Do not use heating pads or hot water bottles on your feet. They may burn your skin. If you have lost feeling in your feet or legs, you may not know this is happening until it is too late.  Protect your feet from hot and cold by wearing shoes, such as at the beach or on hot pavement.  Schedule a complete foot exam at least once a year (annually) or more often if you have foot  problems. Report any cuts, sores, or bruises to your health care provider immediately. Where to find more information  American Diabetes Association: www.diabetes.org  Association of Diabetes Care & Education Specialists: www.diabeteseducator.org Contact a health care provider if:  You have a medical condition that increases your risk of infection and you have any cuts, sores, or bruises on your feet.  You have an injury that is not healing.  You have redness on your legs or feet.  You feel burning or tingling in your legs or feet.  You have pain or cramps in your legs and feet.  Your legs or feet are numb.  Your feet always feel cold.  You have pain around any toenails. Get help right away  if:  You have a wound, scrape, corn, or callus on your foot and: ? You have pain, swelling, or redness that gets worse. ? You have fluid or blood coming from the wound, scrape, corn, or callus. ? Your wound, scrape, corn, or callus feels warm to the touch. ? You have pus or a bad smell coming from the wound, scrape, corn, or callus. ? You have a fever. ? You have a red line going up your leg. Summary  Check your feet every day for blisters, cuts, bruises, sores, and redness.  Apply a moisturizing lotion or petroleum jelly to the skin on your feet and to dry, brittle toenails.  Wear shoes that fit properly and have enough cushioning.  If you have foot problems, report any cuts, sores, or bruises to your health care provider immediately.  Schedule a complete foot exam at least once a year (annually) or more often if you have foot problems. This information is not intended to replace advice given to you by your health care provider. Make sure you discuss any questions you have with your health care provider. Document Revised: 08/01/2019 Document Reviewed: 08/01/2019 Elsevier Patient Education  2021 [employer].

## 2020-02-27 ENCOUNTER — Telehealth: Payer: Self-pay | Admitting: *Deleted

## 2020-02-27 NOTE — Telephone Encounter (Signed)
Received FMLA form to complete for MS.

## 2020-03-02 ENCOUNTER — Other Ambulatory Visit: Payer: Self-pay | Admitting: Family Medicine

## 2020-03-02 MED ORDER — DEXTROAMPHETAMINE SULFATE ER 10 MG PO CP24: 20.0000 mg | ORAL_CAPSULE | Freq: Every day | ORAL | 0 refills | Status: DC

## 2020-03-02 NOTE — Telephone Encounter (Signed)
Pt request refill dextroamphetamine (DEXEDRINE) 10 MG 24 hr capsule at [facility] [phone]

## 2020-03-02 NOTE — Telephone Encounter (Signed)
[place] Drug registry Verified LR:01/30/20 Qty: 120 for 30 days  Last OV:09/26/2019 Pending appointment: 03/31/2020

## 2020-03-03 NOTE — Telephone Encounter (Signed)
Completed to AL/NP for review and signature.

## 2020-03-03 NOTE — Telephone Encounter (Signed)
Signed.

## 2020-03-03 NOTE — Telephone Encounter (Signed)
To MR. 

## 2020-03-26 ENCOUNTER — Telehealth: Payer: Self-pay | Admitting: *Deleted

## 2020-03-26 DIAGNOSIS — G35 Multiple sclerosis: Secondary | ICD-10-CM

## 2020-03-26 MED ORDER — GILENYA 0.5 MG PO CAPS: ORAL_CAPSULE | ORAL | 3 refills | Status: DC

## 2020-03-26 NOTE — Telephone Encounter (Signed)
Called pt. Received [facility] from [facility] asking for refill on Gilenya. We sent refills to [facility] in 01/2020. She states pharmacy switched. Confirmed she would like refill to go to [facility] now. I e-scribed refill.

## 2020-03-30 ENCOUNTER — Telehealth: Payer: Self-pay | Admitting: Family Medicine

## 2020-03-30 MED ORDER — DEXTROAMPHETAMINE SULFATE ER 10 MG PO CP24: 20.0000 mg | ORAL_CAPSULE | Freq: Every day | ORAL | 0 refills | Status: DC

## 2020-03-30 NOTE — Telephone Encounter (Signed)
[place] drug registry has been checked, pt is up to date on appointments and has one coming up tomorrow with you. Refill is appropriate

## 2020-03-30 NOTE — Telephone Encounter (Signed)
Pt request refill dextroamphetamine (DEXEDRINE) 10 MG 24 hr capsule at [id number] 

## 2020-03-31 ENCOUNTER — Ambulatory Visit: Payer: Federal, State, Local not specified - PPO | Admitting: Neurology

## 2020-03-31 ENCOUNTER — Other Ambulatory Visit: Payer: Self-pay

## 2020-03-31 ENCOUNTER — Encounter: Payer: Self-pay | Admitting: Neurology

## 2020-03-31 VITALS — BP 142/70 | HR 98 | Ht 65.5 in | Wt 214.5 lb

## 2020-03-31 DIAGNOSIS — R208 Other disturbances of skin sensation: Secondary | ICD-10-CM

## 2020-03-31 DIAGNOSIS — Z79899 Other long term (current) drug therapy: Secondary | ICD-10-CM

## 2020-03-31 DIAGNOSIS — G35 Multiple sclerosis: Secondary | ICD-10-CM | POA: Diagnosis not present

## 2020-03-31 DIAGNOSIS — F988 Other specified behavioral and emotional disorders with onset usually occurring in childhood and adolescence: Secondary | ICD-10-CM

## 2020-03-31 DIAGNOSIS — R5383 Other fatigue: Secondary | ICD-10-CM | POA: Diagnosis not present

## 2020-03-31 MED ORDER — GABAPENTIN 300 MG PO CAPS: 300.0000 mg | ORAL_CAPSULE | Freq: Three times a day (TID) | ORAL | 11 refills | Status: DC

## 2020-03-31 NOTE — Progress Notes (Signed)
[facility]  PATIENT: Cathy [name] DOB: 26-Jun-1965  REFERRING DOCTOR OR PCP:  Synetta Fail  SOURCE: patient and medical records available  _________________________________   HISTORICAL  CHIEF COMPLAINT:  Chief Complaint  Patient presents with  . Follow-up    RM 12 w/ husband. Last seen 09/26/2019 by AL,NP. On Gilenya for MS.     HISTORY OF PRESENT ILLNESS:  Cathy [name] is a 55 y.o. woman with MS noting more gait issues.  Update 03/31/2020: She is on Gilenya and tolerates it well.   She has no exacerbations recently.  Her last MRIs were stable.  She had a bad fall 2 years ago and continues to have some right ankle/foot swelling.   Xrays have not shown any issue by her report.  She denies new issues with gait, strength or balance but notes more pain in the arms, left forearm and right shoulder.  She notes it bothers her more later in the day and when she tries to go to sleep at night.  She needs to straighten the left arm to make it hurt less.    She has had C3-C6 fusion and has C6C7 DJD with spinal stenosis (mid) and left foraminal narrowing on 2020 MRI.     She is walking the same.   She notes her right leg seems slightly weak.   Bladder functon is fine.    She notes some vision issues up close.       She has fatigue.    She feels better with dexedrine.    She has insomnia some nights.   She notes reduced STM and attnention/focus.  She is on dextroamphetamine 10 mg 4 times a day.    It helps her attention and fatigue.    MS History:   She was diagnosed with MS after presenting with optic neuritis x 2 in 2009.   She had an MRI and LP and was diagnosed with MS.     She started on Copaxone and switched to Gilenya in 2013.    She denies any further exacerbation since the optic neuritis but was told she had MS plaques on her spine when she presented last year with a cervical radiculopathy requiring surgery.    I have seen her the past year at  [facility].   Her last MRI's were Jan 21, 2014.  MRI images show foci in the cerebellum and the hemispheres consistent with MS. There were no acute findings. She has NIDDM, HTN and high cholesterol.  Imaging: MRI cervical spine 06/27/18 showed several T2 hyperintense foci within the spinal cord.  These are located posteriorly adjacent to C2-C3, laterally to the left adjacent to C2-C3, laterally to the right adjacent to C6 and posterolaterally to the right adjacent to C7.  The foci were present on the 01/29/2016 .  Also mild spinal stenosis at [facility] and C6C7.  She has C4-C6 fusion.  MRI Brain 06/27/18:   "There are T2/flair hyperintense foci in the hemispheres, left thalamus, left cerebellum and spinal cord in a pattern and configuration consistent with chronic demyelinating plaque associated with multiple sclerosis.  None of the foci are acute   REVIEW OF SYSTEMS: Constitutional: No fevers, chills, sweats, or change in appetite.   Has fatigue Eyes: H/o right ON with residual color desaturation Ear, nose and throat: No hearing loss, ear pain, nasal congestion, sore throat Cardiovascular: No chest pain, palpitations Respiratory: No shortness of breath at rest or with exertion.   No wheezes GastrointestinaI:  No nausea, vomiting, diarrhea, abdominal pain, fecal incontinence Genitourinary: No dysuria, urinary retention or frequency.  No nocturia. Musculoskeletal: No neck pain, back pain,. Notes left shoulder pain. Integumentary: No rash, pruritus, skin lesions Neurological: as above Psychiatric: No depression at this time.  No anxiety Endocrine: has NIDDM.  No palpitations, diaphoresis, change in appetite, change in weigh or increased thirst Hematologic/Lymphatic: No anemia, purpura, petechiae. Allergic/Immunologic: No itchy/runny eyes, nasal congestion, recent allergic reactions, rashes  ALLERGIES: Allergies  Allergen Reactions  . Aspirin     Heartburn  . Ibuprofen     heatburn     HOME MEDICATIONS:  Current Outpatient Medications:  .  atorvastatin (LIPITOR) 40 MG tablet, TAKE 1 TABLET(40 MG) BY MOUTH DAILY AT 6 PM, Disp: 90 tablet, Rfl: 3 .  Cholecalciferol (VITAMIN D) 2000 UNITS CAPS, Take 2,000 Units by mouth daily., Disp: , Rfl:  .  dextroamphetamine (DEXEDRINE) 10 MG 24 hr capsule, Take 2-4 capsules (20-40 mg total) by mouth daily., Disp: 120 capsule, Rfl: 0 .  diclofenac Sodium (VOLTAREN) 1 % GEL, Apply topically., Disp: , Rfl:  .  Fingolimod HCl (GILENYA) 0.5 MG CAPS, TAKE ONE CAPSULE (0.5 MG) BY MOUTH EACH DAY. MAY TAKE WITH OR WITHOUTFOOD. STORE AT ROOM TEMPERATURE., Disp: 90 capsule, Rfl: 3 .  gabapentin (NEURONTIN) 300 MG capsule, Take 1 capsule (300 mg total) by mouth 3 (three) times daily., Disp: 90 capsule, Rfl: 11 .  imipramine (TOFRANIL) 25 MG tablet, TAKE 1 TABLET(25 MG) BY MOUTH AT BEDTIME, Disp: 30 tablet, Rfl: 5 .  loratadine (CLARITIN) 10 MG tablet, Take 10 mg by mouth daily., Disp: , Rfl:  .  losartan-hydrochlorothiazide (HYZAAR) 50-12.5 MG tablet, Take 1 tablet by mouth daily., Disp: 90 tablet, Rfl: 3 .  Semaglutide, 1 MG/DOSE, (OZEMPIC, 1 MG/DOSE,) 4 MG/3ML SOPN, Inject 1 mg into the skin once a week., Disp: 3 mL, Rfl: 2 .  triamcinolone cream (KENALOG) 0.1 %, Apply topically 3 (three) times daily., Disp: 45 g, Rfl: 1  PAST MEDICAL HISTORY: Past Medical History:  Diagnosis Date  . Diabetes mellitus without complication (HCC)   . Eczema   . Hypertension   . Multiple sclerosis (HCC)   . Vision abnormalities     PAST SURGICAL HISTORY: Past Surgical History:  Procedure Laterality Date  . ABDOMINAL HYSTERECTOMY    . CARPAL TUNNEL RELEASE    . CERVICAL SPINE SURGERY     Dr. Sharolyn Douglas  . LASER ABLATION    . TENDON REPAIR Left    wrist surgery  . TUBAL LIGATION      FAMILY HISTORY: Family History  Problem Relation Age of Onset  . Hypertension Mother   . Diabetes type II Mother   . Stroke Mother   . COPD Father   . Multiple  sclerosis Paternal Uncle   . Multiple sclerosis Paternal Grandfather     SOCIAL HISTORY:  Social History   Socioeconomic History  . Marital status: Married    Spouse name: Anothony  . Number of children: 3  . Years of education: 12+  . Highest education level: Not on file  Occupational History  . Not on file  Tobacco Use  . Smoking status: Never Smoker  . Smokeless tobacco: Never Used  Vaping Use  . Vaping Use: Never used  Substance and Sexual Activity  . Alcohol use: Yes    Alcohol/week: 1.0 standard drink    Types: 1 Glasses of wine per week    Comment: occasionally  . Drug use: No  .  Sexual activity: Yes    Birth control/protection: Surgical  Other Topics Concern  . Not on file  Social History Narrative   Right handed    Caffeine TKW:IOXB sometimes.   Lives with husband,  Ethelene Browns   Social Determinants of Health   Financial Resource Strain: Not on file  Food Insecurity: Not on file  Transportation Needs: Not on file  Physical Activity: Not on file  Stress: Not on file  Social Connections: Not on file  Intimate Partner Violence: Not on file     PHYSICAL EXAM  Vitals:   03/31/20 1611  BP: (!) 142/70  Pulse: 98  SpO2: 98%  Weight: 214 lb 8 oz (97.3 kg)  Height: 5' 5.5" (1.664 m)    Body mass index is 35.15 kg/m.   General: The patient is well-developed and well-nourished and in no acute distress.  She is tender over the subacromial bursa on the right with slightly reduced range of motion in the right shoulder   Neurologic Exam  Mental status: The patient is alert and oriented x 3 at the time of the examination. The patient has apparent normal recent and remote memory, with an apparently normal attention span and concentration ability.   Speech is normal.  Cranial nerves: Extraocular movements are full.  Facial strength and sensation was normal.  Trapezius strength is normal.     No obvious hearing deficits are noted.  Motor:  Muscle bulk is  normal.   Muscle tone is mildly increased in the right leg and strength was 4+/5 in the right foot and left triceps.  .  Strength is 5/5 elsewhere.     Sensory: Sensory testing is intact to soft touch and vibration sensation in all 4 extremities.  Coordination: Cerebellar testing reveals good finger-nose-finger and reduced right heel to shin.  Gait and station: Station is normal.   The gait is fairly normal but the tandem gait is wide.  Romberg is negative.  Reflexes: Deep tendon reflexes are symmetric and normal bilaterally.       1. Multiple sclerosis (HCC)   2. High risk medication use   3. Other fatigue   4. Attention deficit disorder, unspecified hyperactivity presence   5. Dysesthesia     1.   Continue Gilenya for MS.  We will check blood work today.   2.   Continue Dexedrine for fatigue and attention deficit. 3.  Pain of the left arm could be radicular (perhaps due to worsening degenerative changes at C6-C7) the pain in the right shoulder is more likely to be bursitis.  We will check an MRI of the cervical spine to determine if there are new MS lesions or worsening DJD at C6-C7 that might explain the left arm symptoms. 4.   Return in 6 months or sooner if there are new or worsening neurologic symptoms.   Kippy Gohman A. Epimenio Foot, MD, PhD 03/31/2020, 5:08 PM Certified in Neurology, Clinical Neurophysiology, Sleep Medicine, Pain Medicine and Neuroimaging  [facility] [address], [address] [place], [place] [address] [phone] -

## 2020-04-01 ENCOUNTER — Telehealth: Payer: Self-pay | Admitting: *Deleted

## 2020-04-01 LAB — CBC WITH DIFFERENTIAL/PLATELET
Basophils Absolute: 0 10*3/uL (ref 0.0–0.2)
Basos: 1 %
EOS (ABSOLUTE): 0.1 10*3/uL (ref 0.0–0.4)
Eos: 3 %
Hematocrit: 36.5 % (ref 34.0–46.6)
Hemoglobin: 12.6 g/dL (ref 11.1–15.9)
Immature Grans (Abs): 0 10*3/uL (ref 0.0–0.1)
Immature Granulocytes: 1 %
Lymphocytes Absolute: 0.4 10*3/uL — ABNORMAL LOW (ref 0.7–3.1)
Lymphs: 13 %
MCH: 29.4 pg (ref 26.6–33.0)
MCHC: 34.5 g/dL (ref 31.5–35.7)
MCV: 85 fL (ref 79–97)
Monocytes Absolute: 0.4 10*3/uL (ref 0.1–0.9)
Monocytes: 13 %
Neutrophils Absolute: 2.3 10*3/uL (ref 1.4–7.0)
Neutrophils: 69 %
Platelets: 380 10*3/uL (ref 150–450)
RBC: 4.28 x10E6/uL (ref 3.77–5.28)
RDW: 12.7 % (ref 11.7–15.4)
WBC: 3.3 10*3/uL — ABNORMAL LOW (ref 3.4–10.8)

## 2020-04-01 LAB — COMPREHENSIVE METABOLIC PANEL
ALT: 22 IU/L (ref 0–32)
AST: 15 IU/L (ref 0–40)
Albumin/Globulin Ratio: 2 (ref 1.2–2.2)
Albumin: 4.4 g/dL (ref 3.8–4.9)
Alkaline Phosphatase: 155 IU/L — ABNORMAL HIGH (ref 44–121)
BUN/Creatinine Ratio: 10 (ref 9–23)
BUN: 8 mg/dL (ref 6–24)
Bilirubin Total: 0.4 mg/dL (ref 0.0–1.2)
CO2: 24 mmol/L (ref 20–29)
Calcium: 10 mg/dL (ref 8.7–10.2)
Chloride: 95 mmol/L — ABNORMAL LOW (ref 96–106)
Creatinine, Ser: 0.81 mg/dL (ref 0.57–1.00)
Globulin, Total: 2.2 g/dL (ref 1.5–4.5)
Glucose: 124 mg/dL — ABNORMAL HIGH (ref 65–99)
Potassium: 3.6 mmol/L (ref 3.5–5.2)
Sodium: 135 mmol/L (ref 134–144)
Total Protein: 6.6 g/dL (ref 6.0–8.5)
eGFR: 86 mL/min/{1.73_m2} (ref >59)

## 2020-04-01 NOTE — Telephone Encounter (Signed)
Called and spoke with patient about lab results per Dr. Epimenio Foot note. Pt verbalized understanding.

## 2020-04-01 NOTE — Telephone Encounter (Signed)
-----   Message from Asa Lente, MD sent at 04/01/2020 10:44 AM EST ----- Please let the patient know that the lab work is fine.   The lymphocytes were low but that is typical for Gilenya.  The liver labs looked fine.

## 2020-04-02 ENCOUNTER — Telehealth: Payer: Self-pay | Admitting: Neurology

## 2020-04-02 NOTE — Telephone Encounter (Signed)
no to the covid questions MR Cervical spine wo contrast Dr. Gershon Mussel Fed Auth: NPR Ref # [id number]. Patient is scheduled at [facility] for 04/15/20.

## 2020-04-15 ENCOUNTER — Ambulatory Visit: Payer: Federal, State, Local not specified - PPO

## 2020-04-15 ENCOUNTER — Other Ambulatory Visit: Payer: Self-pay

## 2020-04-15 DIAGNOSIS — G35 Multiple sclerosis: Secondary | ICD-10-CM | POA: Diagnosis not present

## 2020-04-16 ENCOUNTER — Telehealth: Payer: Self-pay | Admitting: *Deleted

## 2020-04-16 NOTE — Telephone Encounter (Signed)
Called and spoke with patient about results.  She verbalized understanding.

## 2020-04-16 NOTE — Telephone Encounter (Signed)
-----   Message from Asa Lente, MD sent at 04/16/2020  3:09 PM EDT ----- Please let her know that the MRI of the cervical spine looks okay.  She has a couple MS plaques in the spinal cord but they were all present in 2020 as well  She has some degenerative changes in the spine mostly at the lowest level of the fusion at the next level down.  These degenerative changes are stable and look the same as they did in 2020.

## 2020-05-04 ENCOUNTER — Other Ambulatory Visit: Payer: Self-pay | Admitting: Neurology

## 2020-05-04 MED ORDER — DEXTROAMPHETAMINE SULFATE ER 10 MG PO CP24: 20.0000 mg | ORAL_CAPSULE | Freq: Every day | ORAL | 0 refills | Status: DC

## 2020-05-04 NOTE — Telephone Encounter (Signed)
Pt request refill dextroamphetamine (DEXEDRINE) 10 MG 24 hr capsule at [facility] [phone]

## 2020-06-01 ENCOUNTER — Other Ambulatory Visit: Payer: Self-pay | Admitting: Neurology

## 2020-06-01 MED ORDER — DEXTROAMPHETAMINE SULFATE ER 10 MG PO CP24: 20.0000 mg | ORAL_CAPSULE | Freq: Every day | ORAL | 0 refills | Status: DC

## 2020-06-01 NOTE — Telephone Encounter (Signed)
Pt request refill dextroamphetamine (DEXEDRINE) 10 MG 24 hr capsule at [facility] [phone]

## 2020-06-11 DIAGNOSIS — M7712 Lateral epicondylitis, left elbow: Secondary | ICD-10-CM | POA: Diagnosis not present

## 2020-06-11 DIAGNOSIS — M25522 Pain in left elbow: Secondary | ICD-10-CM | POA: Diagnosis not present

## 2020-06-30 ENCOUNTER — Other Ambulatory Visit: Payer: Self-pay | Admitting: Neurology

## 2020-06-30 MED ORDER — DEXTROAMPHETAMINE SULFATE ER 10 MG PO CP24: 20.0000 mg | ORAL_CAPSULE | Freq: Every day | ORAL | 0 refills | Status: DC

## 2020-06-30 NOTE — Telephone Encounter (Signed)
Pt is requesting a refill for dextroamphetamine (DEXEDRINE) 10 MG 24 hr capsule.  Pharmacy: [id number] -   

## 2020-06-30 NOTE — Addendum Note (Signed)
Addended by: Arther Abbott on: 06/30/2020 03:59 PM   Modules accepted: Orders

## 2020-07-30 ENCOUNTER — Telehealth: Payer: Self-pay | Admitting: Neurology

## 2020-07-30 ENCOUNTER — Other Ambulatory Visit: Payer: Self-pay | Admitting: Neurology

## 2020-07-30 MED ORDER — DEXTROAMPHETAMINE SULFATE ER 10 MG PO CP24: 20.0000 mg | ORAL_CAPSULE | Freq: Every day | ORAL | 0 refills | Status: DC

## 2020-07-30 NOTE — Telephone Encounter (Signed)
Submitted urgent PA on CMM. Key: [id number]. Waiting on determination from [employer].

## 2020-07-30 NOTE — Telephone Encounter (Signed)
PA approved 06/30/2020 to 07/30/2021.

## 2020-07-30 NOTE — Telephone Encounter (Signed)
Pt has called to report that an authorization is needed on her dextroamphetamine (DEXEDRINE) 10 MG 24 hr capsule

## 2020-07-30 NOTE — Telephone Encounter (Signed)
Refill pending approval by MD still.

## 2020-07-30 NOTE — Telephone Encounter (Signed)
Pt requesting refill for dextroamphetamine (DEXEDRINE) 10 MG 24 hr capsule. Pharmacy [facility] [phone].

## 2020-08-18 ENCOUNTER — Other Ambulatory Visit: Payer: Self-pay | Admitting: Nurse Practitioner

## 2020-08-18 DIAGNOSIS — I10 Essential (primary) hypertension: Secondary | ICD-10-CM

## 2020-08-20 MED ORDER — LOSARTAN POTASSIUM-HCTZ 50-12.5 MG PO TABS: 1.0000 | ORAL_TABLET | Freq: Every day | ORAL | 0 refills | Status: DC

## 2020-09-01 ENCOUNTER — Other Ambulatory Visit: Payer: Self-pay | Admitting: Neurology

## 2020-09-01 MED ORDER — DEXTROAMPHETAMINE SULFATE ER 10 MG PO CP24: 20.0000 mg | ORAL_CAPSULE | Freq: Every day | ORAL | 0 refills | Status: DC

## 2020-09-01 NOTE — Telephone Encounter (Signed)
Pt request refill dextroamphetamine (DEXEDRINE) 10 MG 24 hr capsule at [id number] 

## 2020-09-01 NOTE — Telephone Encounter (Signed)
I have routed this request to Dr Sater for review. The pt is due for the medication and [place] registry was verified.  

## 2020-09-02 ENCOUNTER — Other Ambulatory Visit: Payer: Self-pay

## 2020-09-02 ENCOUNTER — Encounter: Payer: Self-pay | Admitting: Nurse Practitioner

## 2020-09-02 ENCOUNTER — Ambulatory Visit: Payer: Federal, State, Local not specified - PPO | Admitting: Nurse Practitioner

## 2020-09-02 VITALS — BP 124/72 | HR 99 | Temp 97.0°F | Ht 65.5 in | Wt 214.0 lb

## 2020-09-02 DIAGNOSIS — E1165 Type 2 diabetes mellitus with hyperglycemia: Secondary | ICD-10-CM | POA: Diagnosis not present

## 2020-09-02 DIAGNOSIS — R748 Abnormal levels of other serum enzymes: Secondary | ICD-10-CM

## 2020-09-02 DIAGNOSIS — Z23 Encounter for immunization: Secondary | ICD-10-CM | POA: Diagnosis not present

## 2020-09-02 DIAGNOSIS — I10 Essential (primary) hypertension: Secondary | ICD-10-CM

## 2020-09-02 DIAGNOSIS — E559 Vitamin D deficiency, unspecified: Secondary | ICD-10-CM | POA: Diagnosis not present

## 2020-09-02 LAB — HEPATIC FUNCTION PANEL
ALT: 32 U/L (ref 0–35)
AST: 22 U/L (ref 0–37)
Albumin: 4 g/dL (ref 3.5–5.2)
Alkaline Phosphatase: 126 U/L — ABNORMAL HIGH (ref 39–117)
Bilirubin, Direct: 0.1 mg/dL (ref 0.0–0.3)
Total Bilirubin: 0.6 mg/dL (ref 0.2–1.2)
Total Protein: 6.4 g/dL (ref 6.0–8.3)

## 2020-09-02 LAB — BASIC METABOLIC PANEL
BUN: 16 mg/dL (ref 6–23)
CO2: 29 mEq/L (ref 19–32)
Calcium: 9.6 mg/dL (ref 8.4–10.5)
Chloride: 101 mEq/L (ref 96–112)
Creatinine, Ser: 0.74 mg/dL (ref 0.40–1.20)
GFR: 91.51 mL/min (ref >60.00)
Glucose, Bld: 150 mg/dL — ABNORMAL HIGH (ref 70–99)
Potassium: 3.9 mEq/L (ref 3.5–5.1)
Sodium: 140 mEq/L (ref 135–145)

## 2020-09-02 LAB — HEMOGLOBIN A1C: Hgb A1c MFr Bld: 6.6 % — ABNORMAL HIGH (ref 4.6–6.5)

## 2020-09-02 LAB — VITAMIN D 25 HYDROXY (VIT D DEFICIENCY, FRACTURES): VITD: 63.23 ng/mL (ref 30.00–100.00)

## 2020-09-02 MED ORDER — LOSARTAN POTASSIUM-HCTZ 50-12.5 MG PO TABS: 1.0000 | ORAL_TABLET | Freq: Every day | ORAL | 1 refills | Status: DC

## 2020-09-02 MED ORDER — OZEMPIC (1 MG/DOSE) 4 MG/3ML ~~LOC~~ SOPN: 1.0000 mg | PEN_INJECTOR | SUBCUTANEOUS | 2 refills | Status: DC

## 2020-09-02 NOTE — Patient Instructions (Addendum)
Go to lab for blood draw Will sent med refill after reviewing lab results  Call [facility] to schedule repeat colonoscopy: [phone].  Schedule appt for annual diabetic eye exam. Have report faxed to me.  Check glucose once daily in Am. Bring readings to next office visit.

## 2020-09-02 NOTE — Progress Notes (Signed)
Subjective:  Patient ID: Cathy [name], female    DOB: Mar 11, 1965  Age: 55 y.o. MRN: [id number]  CC: Follow-up (Pt states she needs medication refills on ozempic. Pt states the cost for a 30 day and 90 day of medication is the same and she would like to start getting a 90 day supply. )  HPI  Essential (primary) hypertension BP at goal with losartan/HCTZ BP Readings from Last 3 Encounters:  09/02/20 124/72  03/31/20 (!) 142/70  02/24/20 126/84   Repeat BMP Maintain current and refill sent  DM (diabetes mellitus) (HCC) She is still not check glucose at home as previously discussed. She denies any adverse side effects with ozempic inj Repeat HgbA1c and BMP today: stable renal function and hgbA1c at 6.6% Continue ozempic injection F/up in 80months  Vitamin D deficiency Normal Continue OTC dose 1000IU daily  Wt Readings from Last 3 Encounters:  09/02/20 214 lb (97.1 kg)  03/31/20 214 lb 8 oz (97.3 kg)  02/24/20 213 lb (96.6 kg)    BP Readings from Last 3 Encounters:  09/02/20 124/72  03/31/20 (!) 142/70  02/24/20 126/84    Reviewed past Medical, Social and Family history today.  Outpatient Medications Prior to Visit  Medication Sig Dispense Refill   atorvastatin (LIPITOR) 40 MG tablet TAKE 1 TABLET(40 MG) BY MOUTH DAILY AT 6 PM 90 tablet 3   Cholecalciferol (VITAMIN D) 2000 UNITS CAPS Take 2,000 Units by mouth daily.     dextroamphetamine (DEXEDRINE) 10 MG 24 hr capsule Take 2-4 capsules (20-40 mg total) by mouth daily. 120 capsule 0   diclofenac Sodium (VOLTAREN) 1 % GEL Apply topically.     Fingolimod HCl (GILENYA) 0.5 MG CAPS TAKE ONE CAPSULE (0.5 MG) BY MOUTH EACH DAY. MAY TAKE WITH OR WITHOUTFOOD. STORE AT ROOM TEMPERATURE. 90 capsule 3   gabapentin (NEURONTIN) 300 MG capsule Take 1 capsule (300 mg total) by mouth 3 (three) times daily. 90 capsule 11   loratadine (CLARITIN) 10 MG tablet Take 10 mg by mouth daily.     triamcinolone cream (KENALOG) 0.1 %  Apply topically 3 (three) times daily. 45 g 1   losartan-hydrochlorothiazide (HYZAAR) 50-12.5 MG tablet Take 1 tablet by mouth daily. Eed office visit for additional refils 90 tablet 0   Semaglutide, 1 MG/DOSE, (OZEMPIC, 1 MG/DOSE,) 4 MG/3ML SOPN Inject 1 mg into the skin once a week. 3 mL 2   imipramine (TOFRANIL) 25 MG tablet TAKE 1 TABLET(25 MG) BY MOUTH AT BEDTIME (Patient not taking: Reported on 09/02/2020) 30 tablet 5   No facility-administered medications prior to visit.    ROS See HPI  Objective:  BP 124/72 (BP Location: Left Arm, Patient Position: Sitting, Cuff Size: Large)   Pulse 99   Temp (!) 97 F (36.1 C) (Temporal)   Ht 5' 5.5" (1.664 m)   Wt 214 lb (97.1 kg)   SpO2 99%   BMI 35.07 kg/m   Physical Exam Vitals reviewed.  Cardiovascular:     Rate and Rhythm: Normal rate and regular rhythm.     Pulses: Normal pulses.     Heart sounds: Normal heart sounds.  Pulmonary:     Effort: Pulmonary effort is normal.     Breath sounds: Normal breath sounds.  Musculoskeletal:     Right lower leg: No edema.     Left lower leg: No edema.  Neurological:     Mental Status: She is oriented to person, place, and time.    Assessment & Plan:  This visit occurred during the SARS-CoV-2 public health emergency.  Safety protocols were in place, including screening questions prior to the visit, additional usage of staff PPE, and extensive cleaning of exam room while observing appropriate contact time as indicated for disinfecting solutions.   Labresha was seen today for follow-up.  Diagnoses and all orders for this visit:  Type 2 diabetes mellitus with hyperglycemia, without long-term current use of insulin (HCC) -     Hemoglobin A1c -     Basic metabolic panel -     Semaglutide, 1 MG/DOSE, (OZEMPIC, 1 MG/DOSE,) 4 MG/3ML SOPN; Inject 1 mg into the skin once a week.  Essential (primary) hypertension -     Basic metabolic panel -     losartan-hydrochlorothiazide (HYZAAR) 50-12.5 MG  tablet; Take 1 tablet by mouth daily.  Elevated alkaline phosphatase level -     Hepatic function panel -     Vitamin D (25 hydroxy)  Need for pneumococcal vaccine -     Pneumococcal conjugate vaccine 20-valent (Prevnar 20)  Vitamin D deficiency -     Vitamin D (25 hydroxy)  Problem List Items Addressed This Visit       Cardiovascular and Mediastinum   Essential (primary) hypertension    BP at goal with losartan/HCTZ BP Readings from Last 3 Encounters:  09/02/20 124/72  03/31/20 (!) 142/70  02/24/20 126/84   Repeat BMP Maintain current and refill sent      Relevant Medications   losartan-hydrochlorothiazide (HYZAAR) 50-12.5 MG tablet   Other Relevant Orders   Basic metabolic panel (Completed)     Endocrine   DM (diabetes mellitus) (HCC) - Primary    She is still not check glucose at home as previously discussed. She denies any adverse side effects with ozempic inj Repeat HgbA1c and BMP today: stable renal function and hgbA1c at 6.6% Continue ozempic injection F/up in 58months       Relevant Medications   losartan-hydrochlorothiazide (HYZAAR) 50-12.5 MG tablet   Semaglutide, 1 MG/DOSE, (OZEMPIC, 1 MG/DOSE,) 4 MG/3ML SOPN   Other Relevant Orders   Hemoglobin A1c (Completed)   Basic metabolic panel (Completed)     Other   Vitamin D deficiency    Normal Continue OTC dose 1000IU daily       Relevant Orders   Vitamin D (25 hydroxy) (Completed)   Other Visit Diagnoses     Elevated alkaline phosphatase level       Relevant Orders   Hepatic function panel (Completed)   Vitamin D (25 hydroxy) (Completed)   Need for pneumococcal vaccine       Relevant Orders   Pneumococcal conjugate vaccine 20-valent (Prevnar 20) (Completed)       Follow-up: Return in about 6 months (around 03/05/2021) for DM and HTN, hyperlipidemia (fasting).  Alysia Penna, NP

## 2020-09-02 NOTE — Assessment & Plan Note (Signed)
Normal Continue OTC dose 1000IU daily

## 2020-09-02 NOTE — Assessment & Plan Note (Addendum)
She is still not check glucose at home as previously discussed. She denies any adverse side effects with ozempic inj Repeat HgbA1c and BMP today: stable renal function and hgbA1c at 6.6% Continue ozempic injection F/up in 21months

## 2020-09-02 NOTE — Assessment & Plan Note (Signed)
BP at goal with losartan/HCTZ BP Readings from Last 3 Encounters:  09/02/20 124/72  03/31/20 (!) 142/70  02/24/20 126/84   Repeat BMP Maintain current and refill sent

## 2020-09-22 DIAGNOSIS — Z1211 Encounter for screening for malignant neoplasm of colon: Secondary | ICD-10-CM | POA: Diagnosis not present

## 2020-10-01 ENCOUNTER — Other Ambulatory Visit: Payer: Self-pay | Admitting: Neurology

## 2020-10-01 MED ORDER — DEXTROAMPHETAMINE SULFATE ER 10 MG PO CP24: 20.0000 mg | ORAL_CAPSULE | Freq: Every day | ORAL | 0 refills | Status: DC

## 2020-10-01 NOTE — Telephone Encounter (Signed)
Pt called needing a refill request for her dextroamphetamine (DEXEDRINE) 10 MG 24 hr capsule sent in to the [facility] in [place].

## 2020-10-01 NOTE — Telephone Encounter (Signed)
[place] drug registry has been verified. Last refill was 09/01/2020 # 120 for a 30 day supply provided by our office.  Pt has been complaint with f/u's.

## 2020-10-07 ENCOUNTER — Encounter: Payer: Self-pay | Admitting: Neurology

## 2020-10-07 ENCOUNTER — Ambulatory Visit: Payer: Federal, State, Local not specified - PPO | Admitting: Neurology

## 2020-10-07 VITALS — BP 137/79 | HR 103 | Ht 65.0 in

## 2020-10-07 DIAGNOSIS — Z79899 Other long term (current) drug therapy: Secondary | ICD-10-CM | POA: Diagnosis not present

## 2020-10-07 DIAGNOSIS — R208 Other disturbances of skin sensation: Secondary | ICD-10-CM | POA: Diagnosis not present

## 2020-10-07 DIAGNOSIS — R5383 Other fatigue: Secondary | ICD-10-CM

## 2020-10-07 DIAGNOSIS — G35 Multiple sclerosis: Secondary | ICD-10-CM | POA: Diagnosis not present

## 2020-10-07 DIAGNOSIS — F988 Other specified behavioral and emotional disorders with onset usually occurring in childhood and adolescence: Secondary | ICD-10-CM | POA: Diagnosis not present

## 2020-10-07 NOTE — Progress Notes (Signed)
[facility]  PATIENT: Cathy [name] DOB: [birth year]  REFERRING DOCTOR OR PCP:  Synetta Fail  SOURCE: patient and medical records available  _________________________________   HISTORICAL  CHIEF COMPLAINT:  Chief Complaint  Patient presents with   Follow-up    Rm 1, alone. Here for 6 month MS f/u, on Gilenya. Pt reports being stable. Pt states feeling dizzy at times.     HISTORY OF PRESENT ILLNESS:  Cathy [name] is a 55 y.o. woman with MS noting more gait issues.  Update 10/07/2020: She is on Gilenya and tolerates it well.   She has no exacerbations recently.  Her last MRIs were stable.    MRI cervical  spine 04/15/2020 shows several older foci bit none new since prior MRI   She notes dizziness - more vertigo while laying down and lightheaded if standing.    Gait is about the same with a mild limp.   No new falls.  She notes mild right leg weakness.   She has some right leg tingling at times.      Bladder functon is fine.   Some constipation.    She notes some vision issues up close.       She has fatigue.    She feels better with dexedrine.    She has insomnia now and then but most nights she does well.   She notes reduced STM and attnention/focus.  She is on dextroamphetamine 10 mg 4 pills in the morning - we discussed changing to 30 mg qAM and 10 mg qnoon.    It helps her attention and fatigue.    She notes mild cognitive issues - mild processing and mild verbal fluency.  Mood is doing well for the most part.     She has lower back pain.  No radicular/sciatica pain    MS History:   She was diagnosed with MS after presenting with optic neuritis x 2 in 2009.   She had an MRI and LP and was diagnosed with MS.     She started on Copaxone and switched to Gilenya in 2013.    She denies any further exacerbation since the optic neuritis but was told she had MS plaques on her spine when she presented last year with a cervical radiculopathy requiring  surgery.    I have seen her the past year at [facility].   Her last MRI's were Jan 21, 2014.  MRI images show foci in the cerebellum and the hemispheres consistent with MS. There were no acute findings. She has NIDDM, HTN and high cholesterol.  Imaging: MRI cervical spine 06/27/18 showed several T2 hyperintense foci within the spinal cord.  These are located posteriorly adjacent to C2-C3, laterally to the left adjacent to C2-C3, laterally to the right adjacent to C6 and posterolaterally to the right adjacent to C7.  The foci were present on the 01/29/2016 .  Also mild spinal stenosis at [facility] and C6C7.  She has C4-C6 fusion.  MRI Brain 06/27/18:   "There are T2/flair hyperintense foci in the hemispheres, left thalamus, left cerebellum and spinal cord in a pattern and configuration consistent with chronic demyelinating plaque associated with multiple sclerosis.  None of the foci are acute  MRI cervical spine 04/15/2020 showed patchy T2 hyperintensity within the cervical spine, discrete foci are located centrally adjacent to C2, posteriorly adjacent to C3, to the right adjacent to C6-C7 and to the right adjacent to C7-T1.  There did not appear to be any  new lesions compared to the MRI from 2020.   There is metal artifact consistent with C3-C6 ACDF, unchanged compared to the 2020 MRI  REVIEW OF SYSTEMS: Constitutional: No fevers, chills, sweats, or change in appetite.   Has fatigue Eyes: H/o right ON with residual color desaturation Ear, nose and throat: No hearing loss, ear pain, nasal congestion, sore throat Cardiovascular: No chest pain, palpitations Respiratory:  No shortness of breath at rest or with exertion.   No wheezes GastrointestinaI: No nausea, vomiting, diarrhea, abdominal pain, fecal incontinence Genitourinary:  No dysuria, urinary retention or frequency.  No nocturia. Musculoskeletal:  No neck pain, back pain,. Notes left shoulder pain. Integumentary: No rash, pruritus, skin  lesions Neurological: as above Psychiatric: No depression at this time.  No anxiety Endocrine: has NIDDM.  No palpitations, diaphoresis, change in appetite, change in weigh or increased thirst Hematologic/Lymphatic:  No anemia, purpura, petechiae. Allergic/Immunologic: No itchy/runny eyes, nasal congestion, recent allergic reactions, rashes  ALLERGIES: Allergies  Allergen Reactions   Aspirin     Heartburn   Ibuprofen     heatburn    HOME MEDICATIONS:  Current Outpatient Medications:    atorvastatin (LIPITOR) 40 MG tablet, TAKE 1 TABLET(40 MG) BY MOUTH DAILY AT 6 PM, Disp: 90 tablet, Rfl: 3   Cholecalciferol (VITAMIN D) 2000 UNITS CAPS, Take 2,000 Units by mouth daily., Disp: , Rfl:    dextroamphetamine (DEXEDRINE) 10 MG 24 hr capsule, Take 2-4 capsules (20-40 mg total) by mouth daily., Disp: 120 capsule, Rfl: 0   diclofenac Sodium (VOLTAREN) 1 % GEL, Apply topically., Disp: , Rfl:    Fingolimod HCl (GILENYA) 0.5 MG CAPS, TAKE ONE CAPSULE (0.5 MG) BY MOUTH EACH DAY. MAY TAKE WITH OR WITHOUTFOOD. STORE AT ROOM TEMPERATURE., Disp: 90 capsule, Rfl: 3   gabapentin (NEURONTIN) 300 MG capsule, Take 1 capsule (300 mg total) by mouth 3 (three) times daily., Disp: 90 capsule, Rfl: 11   loratadine (CLARITIN) 10 MG tablet, Take 10 mg by mouth daily., Disp: , Rfl:    losartan-hydrochlorothiazide (HYZAAR) 50-12.5 MG tablet, Take 1 tablet by mouth daily., Disp: 90 tablet, Rfl: 1   Semaglutide, 1 MG/DOSE, (OZEMPIC, 1 MG/DOSE,) 4 MG/3ML SOPN, Inject 1 mg into the skin once a week., Disp: 12 mL, Rfl: 2   triamcinolone cream (KENALOG) 0.1 %, Apply topically 3 (three) times daily., Disp: 45 g, Rfl: 1  PAST MEDICAL HISTORY: Past Medical History:  Diagnosis Date   Diabetes mellitus without complication (HCC)    Eczema    Hypertension    Multiple sclerosis (HCC)    Vision abnormalities     PAST SURGICAL HISTORY: Past Surgical History:  Procedure Laterality Date   ABDOMINAL HYSTERECTOMY      CARPAL TUNNEL RELEASE     CERVICAL SPINE SURGERY     Dr. Sharolyn Douglas   LASER ABLATION     TENDON REPAIR Left    wrist surgery   TUBAL LIGATION      FAMILY HISTORY: Family History  Problem Relation Age of Onset   Hypertension Mother    Diabetes type II Mother    Stroke Mother    COPD Father    Multiple sclerosis Paternal Uncle    Multiple sclerosis Paternal Grandfather     SOCIAL HISTORY:  Social History   Socioeconomic History   Marital status: Married    Spouse name: Anothony   Number of children: 3   Years of education: 12+   Highest education level: Not on file  Occupational History  Not on file  Tobacco Use   Smoking status: Never   Smokeless tobacco: Never  Vaping Use   Vaping Use: Never used  Substance and Sexual Activity   Alcohol use: Yes    Alcohol/week: 1.0 standard drink    Types: 1 Glasses of wine per week    Comment: occasionally   Drug use: No   Sexual activity: Yes    Birth control/protection: Surgical  Other Topics Concern   Not on file  Social History Narrative   Right handed    Caffeine IHK:VQQV sometimes.   Lives with husband,  Ethelene Browns   Social Determinants of Health   Financial Resource Strain: Not on file  Food Insecurity: Not on file  Transportation Needs: Not on file  Physical Activity: Not on file  Stress: Not on file  Social Connections: Not on file  Intimate Partner Violence: Not on file     PHYSICAL EXAM  Vitals:   10/07/20 1544  BP: 137/79  Pulse: (!) 103  Height: 5\' 5"  (1.651 m)    Body mass index is 35.61 kg/m.   General: The patient is well-developed and well-nourished and in no acute distress.  She is tender over the subacromial bursa on the right with slightly reduced range of motion in the right shoulder   Neurologic Exam  Mental status: The patient is alert and oriented x 3 at the time of the examination. The patient has apparent normal recent and remote memory, with an apparently normal attention span  and concentration ability.   Speech is normal.  Cranial nerves: Extraocular movements are full.  Facial strength and sensation was normal.  Trapezius strength is normal.     No obvious hearing deficits are noted.  Motor:  Muscle bulk is normal.   Muscle tone is mildly increased in the right leg and strength was 4+/5 in the right foot and left triceps.  .  Strength is 5/5 elsewhere.      Sensory: intact touch/vibration sensation  Coordination: Cerebellar testing reveals good finger-nose-finger and reduced right heel to shin.  Gait and station: Station is normal.   The gait slightly wide and the tandem gait is wide.  Romberg is negative.  Reflexes: Deep tendon reflexes are symmetric and normal bilaterally.       1. Multiple sclerosis (HCC)   2. High risk medication use   3. Attention deficit disorder, unspecified hyperactivity presence   4. Dysesthesia   5. Other fatigue      1.   Continue Gilenya for MS.  We will check blood work today.   2.   Continue  Dexedrine for fatigue and attention deficit.    Change to 30 mg qAm and 10 mg qnoon 3.   Tylenol for lower back pain.  4.   Return in 6 months or sooner if there are new or worsening neurologic symptoms.   Boston Cookson A. , MD, PhD 10/07/2020, 4:09 PM Certified in Neurology, Clinical Neurophysiology, Sleep Medicine, Pain Medicine and Neuroimaging  [facility] [address] [place], [place] [phone] -

## 2020-10-08 LAB — CBC WITH DIFFERENTIAL/PLATELET
Basophils Absolute: 0 10*3/uL (ref 0.0–0.2)
Basos: 1 %
EOS (ABSOLUTE): 0.1 10*3/uL (ref 0.0–0.4)
Eos: 2 %
Hematocrit: 36.2 % (ref 34.0–46.6)
Hemoglobin: 12.4 g/dL (ref 11.1–15.9)
Immature Grans (Abs): 0 10*3/uL (ref 0.0–0.1)
Immature Granulocytes: 0 %
Lymphocytes Absolute: 0.6 10*3/uL — ABNORMAL LOW (ref 0.7–3.1)
Lymphs: 13 %
MCH: 29.6 pg (ref 26.6–33.0)
MCHC: 34.3 g/dL (ref 31.5–35.7)
MCV: 86 fL (ref 79–97)
Monocytes Absolute: 0.4 10*3/uL (ref 0.1–0.9)
Monocytes: 9 %
Neutrophils Absolute: 3.1 10*3/uL (ref 1.4–7.0)
Neutrophils: 75 %
Platelets: 356 10*3/uL (ref 150–450)
RBC: 4.19 x10E6/uL (ref 3.77–5.28)
RDW: 13.2 % (ref 11.7–15.4)
WBC: 4.2 10*3/uL (ref 3.4–10.8)

## 2020-10-14 ENCOUNTER — Encounter: Payer: Self-pay | Admitting: *Deleted

## 2020-10-14 ENCOUNTER — Telehealth: Payer: Self-pay | Admitting: Neurology

## 2020-10-14 NOTE — Telephone Encounter (Signed)
Called pt back. She was supposed to go today for jury duty. However, she woke up and was having mobility issues r/t MS. She called to let them know and they told her they will have to r/s unless she had note from MD. Advised we are happy to write letter. She will pick up letter tomorrow. Aware it will be up front for her to pick up.

## 2020-10-14 NOTE — Telephone Encounter (Signed)
Pt called wanting to know if she can get a letter to excuse her from [place] [employer] due to her MS. Please advise.

## 2020-10-15 NOTE — Telephone Encounter (Signed)
Pt is asking if this letter can now be faxed to the attention of [facility] fax line#[phone], his ph#[phone] Pt is asking if when faxing this you can please put her name along with her phone # so she can be called once the fax has been received.

## 2020-10-15 NOTE — Telephone Encounter (Signed)
Stanton Kidney- can you help w/ this request? Letter is up front. Thank you

## 2020-10-15 NOTE — Telephone Encounter (Signed)
Done pt letter faxed to [facility] @ [phone].

## 2020-10-18 ENCOUNTER — Encounter: Payer: Self-pay | Admitting: Nurse Practitioner

## 2020-10-30 ENCOUNTER — Other Ambulatory Visit: Payer: Self-pay | Admitting: Neurology

## 2020-10-30 NOTE — Telephone Encounter (Signed)
Pt called requesting refill for dextroamphetamine (DEXEDRINE) 10 MG 24 hr capsule. Pharmacy [facility] [phone].

## 2020-11-02 MED ORDER — DEXTROAMPHETAMINE SULFATE ER 10 MG PO CP24: 20.0000 mg | ORAL_CAPSULE | Freq: Every day | ORAL | 0 refills | Status: DC

## 2020-11-02 NOTE — Addendum Note (Signed)
Addended by: Arther Abbott on: 11/02/2020 08:26 AM   Modules accepted: Orders

## 2020-12-02 ENCOUNTER — Other Ambulatory Visit: Payer: Self-pay | Admitting: Neurology

## 2020-12-02 MED ORDER — DEXTROAMPHETAMINE SULFATE ER 10 MG PO CP24: 20.0000 mg | ORAL_CAPSULE | Freq: Every day | ORAL | 0 refills | Status: DC

## 2020-12-02 NOTE — Telephone Encounter (Signed)
Received refill request for Dexedrine 10mg .  Last OV was on 10/07/20.  Next OV is scheduled for 04/07/21 .  Last RX was written on 11/02/20 for 120 tabs.   [place] [facility] Database has been reviewed.

## 2020-12-02 NOTE — Telephone Encounter (Signed)
Pt has called for a refill on her dextroamphetamine (DEXEDRINE) 10 MG 24 hr capsule to [facility] [phone]

## 2020-12-02 NOTE — Telephone Encounter (Signed)
Received refill request for dextroamphetamine.  Last OV was on 10/07/20.  Next OV is scheduled for 04/07/21 .  Last RX was written on 11/02/20 for 120 tabs.   [place] Drug Database has been reviewed.

## 2020-12-02 NOTE — Telephone Encounter (Signed)
Pt called requesting refill for dextroamphetamine (DEXEDRINE) 10 MG 24 hr capsule. Pharmacy [facility] [phone] - [place], [place] - [address] [facility] [address]. Pt would like a call back once prescription has been sent to pharmacy.

## 2020-12-09 ENCOUNTER — Telehealth: Payer: Self-pay | Admitting: *Deleted

## 2020-12-09 NOTE — Telephone Encounter (Signed)
Received fax from [facility] that PA approved 11/08/20-12/08/21.

## 2020-12-09 NOTE — Telephone Encounter (Signed)
Late entry from 111/5/22:Submitted PA Gilenya via fax to [facility] at [phone]. Received fax confirmation, waiting on determination.

## 2020-12-29 ENCOUNTER — Other Ambulatory Visit: Payer: Self-pay | Admitting: Neurology

## 2020-12-29 MED ORDER — DEXTROAMPHETAMINE SULFATE ER 10 MG PO CP24
20.0000 mg | ORAL_CAPSULE | Freq: Every day | ORAL | 0 refills | Status: DC
Start: 2020-12-31 — End: 2021-02-01

## 2020-12-29 NOTE — Telephone Encounter (Signed)
Pt requesting refill for dextroamphetamine (DEXEDRINE) 10 MG 24 hr capsule. Pharmacy [facility] [phone] - [place], [place] - [address] AT [facility] [address].

## 2021-02-01 ENCOUNTER — Other Ambulatory Visit: Payer: Self-pay | Admitting: Neurology

## 2021-02-01 MED ORDER — DEXTROAMPHETAMINE SULFATE ER 10 MG PO CP24: 20.0000 mg | ORAL_CAPSULE | Freq: Every day | ORAL | 0 refills | Status: DC

## 2021-02-01 NOTE — Telephone Encounter (Signed)
Pt requesting refill for dextroamphetamine (DEXEDRINE) 10 MG 24 hr capsule. Pharmacy [id number] - [place], [place] - [address]. 

## 2021-02-02 ENCOUNTER — Telehealth: Payer: Self-pay | Admitting: Neurology

## 2021-02-02 NOTE — Telephone Encounter (Signed)
Pt called states she does not want the generic form of the Fingolimod HCl (GILENYA) 0.5 MG CAPS

## 2021-02-02 NOTE — Telephone Encounter (Signed)
Called pt back to further discuss. She called specialty pharmacy to refill brand Gilenya. Copay over 200.00 for generic. She was told by specialty pharmacy to call and have new rx sent stating brand medically necessary. Explained we are unable to do so unless she has tried/failed generic because this will require PA and then we have to provide documentation that she has tried/failed generic.  Advised she will need to come in for appt to discuss alternative options with Dr. Epimenio Foot. I mentioned two similar meds include Zeposia/Mayzent. Scheduled appt for 02/03/21 at 2pm w/ Dr. Epimenio Foot. Asked she check in at 1:30p and bring updated insurance cards to appt. She verbalized understanding.  She took last dose of Gilenya today. Has no more on hand.

## 2021-02-03 ENCOUNTER — Encounter: Payer: Self-pay | Admitting: Neurology

## 2021-02-03 ENCOUNTER — Ambulatory Visit: Payer: Federal, State, Local not specified - PPO | Admitting: Neurology

## 2021-02-03 VITALS — BP 161/82 | HR 104 | Ht 65.0 in | Wt 221.0 lb

## 2021-02-03 DIAGNOSIS — R208 Other disturbances of skin sensation: Secondary | ICD-10-CM | POA: Diagnosis not present

## 2021-02-03 DIAGNOSIS — F988 Other specified behavioral and emotional disorders with onset usually occurring in childhood and adolescence: Secondary | ICD-10-CM

## 2021-02-03 DIAGNOSIS — R5383 Other fatigue: Secondary | ICD-10-CM

## 2021-02-03 DIAGNOSIS — G35 Multiple sclerosis: Secondary | ICD-10-CM

## 2021-02-03 DIAGNOSIS — Z79899 Other long term (current) drug therapy: Secondary | ICD-10-CM

## 2021-02-03 DIAGNOSIS — R400 Somnolence: Secondary | ICD-10-CM

## 2021-02-03 NOTE — Progress Notes (Signed)
Dr. Felecia Shelling revised FMLA completed last on 03/03/20. He changed  intermittent leave from 3x/month (1 day per espisode) to 6x/month (4 hr/episode). Gave to MR to send updated FMLA for pt.

## 2021-02-03 NOTE — Progress Notes (Signed)
[facility]  PATIENT: Cathy [name] DOB: [birth year]  REFERRING DOCTOR OR PCP:  Synetta Fail  SOURCE: patient and medical records available  _________________________________   HISTORICAL  CHIEF COMPLAINT:  Chief Complaint  Patient presents with   Follow-up    RM 16. Here to discuss other DMT options. Currently on Gilenya but generic unaffordable.    HISTORY OF PRESENT ILLNESS:  Cathy [name] is a 56 y.o. woman with MS andgait issues.  Update 02/03/2021: She was on Gilenya and tolerates it well.  She had her last pill yesterday.   She is having trouble affording the copay for the generic medication.   She has no exacerbations recently.  Her last MRIs were stable.    MRI cervical  spine 04/15/2020 shows several older foci bit none new since prior MRI   She feels stable but has more fatigue.   She has no new neurologic symptoms or exacerbations.     Gait is about the same with a mild limp.   No new falls.  She notes mild right leg weakness.   She has some right leg tingling at times.      Bladder functon is fine.   Some constipation.    She notes some vision issues up close.       Her fatigue is better with dextroamphetamine.     She feels better with dexedrine.    She has insomnia now and then but most nights she does well.  Her husband has not tole her if she has snoring.   Years ago, she had a PSG but was unable to sleep enough so does not know if she has OSA.    She notes reduced STM and attnention/focus.   Also mild verbal processing issues.   She is on dextroamphetamine 10 mg 4 pills in the morning -  helps her attention and fatigue.    Mood is doing well for the most part.     She has lower back pain.  No radicular/sciatica pain    MS History:   She was diagnosed with MS after presenting with optic neuritis x 2 in 2009.   She had an MRI and LP and was diagnosed with MS.     She started on Copaxone and switched to Gilenya in 2013.    She  denies any further exacerbation since the optic neuritis but was told she had MS plaques on her spine when she presented last year with a cervical radiculopathy requiring surgery.    I have seen her the past year at [facility].   Her last MRI's were Jan 21, 2014.  MRI images show foci in the cerebellum and the hemispheres consistent with MS. There were no acute findings. She has NIDDM, HTN and high cholesterol.  Imaging: MRI cervical spine 06/27/18 showed several T2 hyperintense foci within the spinal cord.  These are located posteriorly adjacent to C2-C3, laterally to the left adjacent to C2-C3, laterally to the right adjacent to C6 and posterolaterally to the right adjacent to C7.  The foci were present on the 01/29/2016 .  Also mild spinal stenosis at [facility] and C6C7.  She has C4-C6 fusion.  MRI Brain 06/27/18:   "There are T2/flair hyperintense foci in the hemispheres, left thalamus, left cerebellum and spinal cord in a pattern and configuration consistent with chronic demyelinating plaque associated with multiple sclerosis.  None of the foci are acute  MRI cervical spine 04/15/2020 showed patchy T2 hyperintensity within the cervical  spine, discrete foci are located centrally adjacent to C2, posteriorly adjacent to C3, to the right adjacent to C6-C7 and to the right adjacent to C7-T1.  There did not appear to be any new lesions compared to the MRI from 2020.   There is metal artifact consistent with C3-C6 ACDF, unchanged compared to the 2020 MRI  REVIEW OF SYSTEMS: Constitutional: No fevers, chills, sweats, or change in appetite.   Has fatigue Eyes: H/o right ON with residual color desaturation Ear, nose and throat: No hearing loss, ear pain, nasal congestion, sore throat Cardiovascular: No chest pain, palpitations Respiratory:  No shortness of breath at rest or with exertion.   No wheezes GastrointestinaI: No nausea, vomiting, diarrhea, abdominal pain, fecal incontinence Genitourinary:  No  dysuria, urinary retention or frequency.  No nocturia. Musculoskeletal:  No neck pain, back pain,. Notes left shoulder pain. Integumentary: No rash, pruritus, skin lesions Neurological: as above Psychiatric: No depression at this time.  No anxiety Endocrine: has NIDDM.  No palpitations, diaphoresis, change in appetite, change in weigh or increased thirst Hematologic/Lymphatic:  No anemia, purpura, petechiae. Allergic/Immunologic: No itchy/runny eyes, nasal congestion, recent allergic reactions, rashes  ALLERGIES: Allergies  Allergen Reactions   Aspirin     Heartburn   Ibuprofen     heatburn    HOME MEDICATIONS:  Current Outpatient Medications:    atorvastatin (LIPITOR) 40 MG tablet, TAKE 1 TABLET(40 MG) BY MOUTH DAILY AT 6 PM, Disp: 90 tablet, Rfl: 3   Cholecalciferol (VITAMIN D) 2000 UNITS CAPS, Take 2,000 Units by mouth daily., Disp: , Rfl:    dextroamphetamine (DEXEDRINE) 10 MG 24 hr capsule, Take 2-4 capsules (20-40 mg total) by mouth daily., Disp: 120 capsule, Rfl: 0   diclofenac Sodium (VOLTAREN) 1 % GEL, Apply topically., Disp: , Rfl:    gabapentin (NEURONTIN) 300 MG capsule, Take 1 capsule (300 mg total) by mouth 3 (three) times daily., Disp: 90 capsule, Rfl: 11   loratadine (CLARITIN) 10 MG tablet, Take 10 mg by mouth daily., Disp: , Rfl:    losartan-hydrochlorothiazide (HYZAAR) 50-12.5 MG tablet, Take 1 tablet by mouth daily., Disp: 90 tablet, Rfl: 1   Semaglutide, 1 MG/DOSE, (OZEMPIC, 1 MG/DOSE,) 4 MG/3ML SOPN, Inject 1 mg into the skin once a week., Disp: 12 mL, Rfl: 2   triamcinolone cream (KENALOG) 0.1 %, Apply topically 3 (three) times daily., Disp: 45 g, Rfl: 1   Fingolimod HCl (GILENYA) 0.5 MG CAPS, TAKE ONE CAPSULE (0.5 MG) BY MOUTH EACH DAY. MAY TAKE WITH OR WITHOUTFOOD. STORE AT ROOM TEMPERATURE. (Patient not taking: Reported on 02/03/2021), Disp: 90 capsule, Rfl: 3  PAST MEDICAL HISTORY: Past Medical History:  Diagnosis Date   Diabetes mellitus without  complication (HCC)    Eczema    Hypertension    Multiple sclerosis (HCC)    Vision abnormalities     PAST SURGICAL HISTORY: Past Surgical History:  Procedure Laterality Date   ABDOMINAL HYSTERECTOMY     CARPAL TUNNEL RELEASE     CERVICAL SPINE SURGERY     Dr. Sharolyn Douglas   LASER ABLATION     TENDON REPAIR Left    wrist surgery   TUBAL LIGATION      FAMILY HISTORY: Family History  Problem Relation Age of Onset   Hypertension Mother    Diabetes type II Mother    Stroke Mother    COPD Father    Multiple sclerosis Paternal Uncle    Multiple sclerosis Paternal Grandfather     SOCIAL HISTORY:  Social  History   Socioeconomic History   Marital status: Married    Spouse name: Anothony   Number of children: 3   Years of education: 12+   Highest education level: Not on file  Occupational History   Not on file  Tobacco Use   Smoking status: Never   Smokeless tobacco: Never  Vaping Use   Vaping Use: Never used  Substance and Sexual Activity   Alcohol use: Yes    Alcohol/week: 1.0 standard drink    Types: 1 Glasses of wine per week    Comment: occasionally   Drug use: No   Sexual activity: Yes    Birth control/protection: Surgical  Other Topics Concern   Not on file  Social History Narrative   Right handed    Caffeine OXB:DZHG sometimes.   Lives with husband,  Ethelene Browns   Social Determinants of Health   Financial Resource Strain: Not on file  Food Insecurity: Not on file  Transportation Needs: Not on file  Physical Activity: Not on file  Stress: Not on file  Social Connections: Not on file  Intimate Partner Violence: Not on file     PHYSICAL EXAM  Vitals:   02/03/21 1402  BP: (!) 161/82  Pulse: (!) 104  Weight: 221 lb (100.2 kg)  Height: 5\' 5"  (1.651 m)    Body mass index is 36.78 kg/m.   General: The patient is well-developed and well-nourished and in no acute distress.      Neurologic Exam  Mental status: The patient is alert and oriented  x 3 at the time of the examination. The patient has apparent normal recent and remote memory, with an apparently normal attention span and concentration ability.   Speech is normal.  Cranial nerves: Extraocular movements are full.  Facial strength and sensation was normal.  Trapezius strength is normal.     No obvious hearing deficits are noted.  Motor:  Muscle bulk is normal.   Muscle tone is mildly increased in the right leg and strength was 4+/5 in the right foot and left triceps.  .  Strength is 5/5 elsewhere.      Sensory: intact touch/vibration sensation  Coordination: Cerebellar testing reveals good finger-nose-finger and reduced right heel to shin.  Gait and station: Station is normal.   The gait is mildly wide.  Tandem gait is moderately wide..  Romberg is negative.  Reflexes: Deep tendon reflexes are symmetric and normal bilaterally.       1. Multiple sclerosis (HCC)   2. High risk medication use   3. Attention deficit disorder, unspecified hyperactivity presence   4. Dysesthesia   5. Other fatigue      1.   Change to Zeposia or Mayzent from Gilenya for MS.  We will check blood work today.   2.   Continue  Dexedrine for fatigue and attention deficit.    Consider a PSG if sleepiness, fatigue and memory issues do not improve 3.   Adjust FMLA to allow for late days due to difficulty with morning sleepiness -- fax to [phone] 4.   Return in 6 months or sooner if there are new or worsening neurologic symptoms.   Zoei Amison A. Epimenio Foot, MD, PhD 02/03/2021, 2:27 PM Certified in Neurology, Clinical Neurophysiology, Sleep Medicine, Pain Medicine and Neuroimaging  [facility] [address] [place], [place] [address] [phone] -

## 2021-02-08 ENCOUNTER — Telehealth: Payer: Self-pay

## 2021-02-08 NOTE — Telephone Encounter (Signed)
I called patient. I advised her that Dr. Epimenio Foot said that her labs are ok for zeposia. She is agreeable to starting zeposia. She has not had a recent EKG or ME screening. She is eligible for assessments to be completed at home through the [employer]eposia360 support. She is out of Gilenya.   I have faxed the zeposia start form to [employer]. Received a receipt of confirmation.  I have started an urgent PA on CMM for zeposia. Sent to [facility].  Zeposia 0.92 capsules PA Key: Key: [id number].

## 2021-02-08 NOTE — Telephone Encounter (Signed)
PA for zeposia with starter pack was approved from 01/09/2021-02/08/2022. The patient is eligible to receive up to 2 fills of zeposia with starter pack with no copay by the end of the benefit year.   I have informed the Zeposia 360 support hub of this approval via fax.

## 2021-02-08 NOTE — Telephone Encounter (Signed)
-----   Message from Wyvonnia Lora, RN sent at 02/04/2021  2:27 PM EST -----  ----- Message ----- From: Britt Bottom, MD Sent: 02/04/2021   1:31 PM EST To: Lester [place], RN, Gna-Pod 1 Results  Labs ok for [employer].

## 2021-02-10 LAB — CBC WITH DIFFERENTIAL/PLATELET
Basophils Absolute: 0 10*3/uL (ref 0.0–0.2)
Basos: 1 %
EOS (ABSOLUTE): 0.1 10*3/uL (ref 0.0–0.4)
Eos: 2 %
Hematocrit: 38 % (ref 34.0–46.6)
Hemoglobin: 12.8 g/dL (ref 11.1–15.9)
Immature Grans (Abs): 0 10*3/uL (ref 0.0–0.1)
Immature Granulocytes: 0 %
Lymphocytes Absolute: 0.4 10*3/uL — ABNORMAL LOW (ref 0.7–3.1)
Lymphs: 11 %
MCH: 29.8 pg (ref 26.6–33.0)
MCHC: 33.7 g/dL (ref 31.5–35.7)
MCV: 89 fL (ref 79–97)
Monocytes Absolute: 0.3 10*3/uL (ref 0.1–0.9)
Monocytes: 9 %
Neutrophils Absolute: 2.7 10*3/uL (ref 1.4–7.0)
Neutrophils: 77 %
Platelets: 342 10*3/uL (ref 150–450)
RBC: 4.29 x10E6/uL (ref 3.77–5.28)
RDW: 13 % (ref 11.7–15.4)
WBC: 3.5 10*3/uL (ref 3.4–10.8)

## 2021-02-10 LAB — HEPATIC FUNCTION PANEL
ALT: 23 IU/L (ref 0–32)
AST: 15 IU/L (ref 0–40)
Albumin: 4.4 g/dL (ref 3.8–4.9)
Alkaline Phosphatase: 181 IU/L — ABNORMAL HIGH (ref 44–121)
Bilirubin Total: 0.3 mg/dL (ref 0.0–1.2)
Bilirubin, Direct: 0.12 mg/dL (ref 0.00–0.40)
Total Protein: 6.3 g/dL (ref 6.0–8.5)

## 2021-02-10 LAB — CYP2C9 GENOTYPING SIPONIMOD

## 2021-02-10 LAB — VARICELLA ZOSTER ANTIBODY, IGG: Varicella zoster IgG: 1631 index (ref >165)

## 2021-02-24 NOTE — Telephone Encounter (Signed)
EKG/Macular edema tests came back. Ok for pt to start Zeposia. Faxed back completed/signed verification of baseline test completion to [employer] support at [phone]. Received fax confirmation.

## 2021-03-01 ENCOUNTER — Encounter: Payer: Self-pay | Admitting: *Deleted

## 2021-03-01 ENCOUNTER — Encounter: Payer: Self-pay | Admitting: Neurology

## 2021-03-02 DIAGNOSIS — M7712 Lateral epicondylitis, left elbow: Secondary | ICD-10-CM | POA: Diagnosis not present

## 2021-03-04 ENCOUNTER — Other Ambulatory Visit: Payer: Self-pay | Admitting: Neurology

## 2021-03-04 MED ORDER — DEXTROAMPHETAMINE SULFATE ER 10 MG PO CP24: 20.0000 mg | ORAL_CAPSULE | Freq: Every day | ORAL | 0 refills | Status: DC

## 2021-03-04 NOTE — Telephone Encounter (Signed)
Pt is requesting a refill for dextroamphetamine (DEXEDRINE) 10 MG 24 hr capsule.  Pharmacy: [id number] -   

## 2021-03-05 ENCOUNTER — Ambulatory Visit: Payer: Federal, State, Local not specified - PPO | Admitting: Nurse Practitioner

## 2021-03-08 NOTE — Addendum Note (Signed)
Addended by: Lester [place] A on: 03/08/2021 10:58 AM   Modules accepted: Orders

## 2021-03-08 NOTE — Telephone Encounter (Signed)
I called patient. She received her Zeposia shipment and has started taking it. She is tolerating it well. I reminded her of her appointment in July. Pt verbalized understanding.

## 2021-04-01 ENCOUNTER — Other Ambulatory Visit: Payer: Self-pay | Admitting: Neurology

## 2021-04-01 MED ORDER — DEXTROAMPHETAMINE SULFATE ER 10 MG PO CP24: 20.0000 mg | ORAL_CAPSULE | Freq: Every day | ORAL | 0 refills | Status: DC

## 2021-04-01 NOTE — Telephone Encounter (Signed)
Pt called needing a refill request on her dextroamphetamine (DEXEDRINE) 10 MG 24 hr capsule sent in to the [facility] in [place] ?

## 2021-04-07 ENCOUNTER — Ambulatory Visit: Payer: Federal, State, Local not specified - PPO | Admitting: Family Medicine

## 2021-04-16 ENCOUNTER — Ambulatory Visit: Payer: Federal, State, Local not specified - PPO | Admitting: Nurse Practitioner

## 2021-04-21 ENCOUNTER — Other Ambulatory Visit: Payer: Self-pay | Admitting: Nurse Practitioner

## 2021-04-21 DIAGNOSIS — E782 Mixed hyperlipidemia: Secondary | ICD-10-CM

## 2021-05-03 ENCOUNTER — Other Ambulatory Visit: Payer: Self-pay | Admitting: Neurology

## 2021-05-03 MED ORDER — DEXTROAMPHETAMINE SULFATE ER 10 MG PO CP24
20.0000 mg | ORAL_CAPSULE | Freq: Every day | ORAL | 0 refills | Status: DC
Start: 2021-05-03 — End: 2021-05-31

## 2021-05-03 NOTE — Telephone Encounter (Signed)
Patient is due for a refill on dexedrine. Patient is up to date on her appointments. [place] Controlled Substance Registry checked and is appropriate. ? ?

## 2021-05-03 NOTE — Telephone Encounter (Signed)
Pt is requesting a refill for dextroamphetamine (DEXEDRINE) 10 MG 24 hr capsule to  ?Pharmacy: [facility] [phone] ? ?

## 2021-05-11 ENCOUNTER — Ambulatory Visit: Payer: Federal, State, Local not specified - PPO | Admitting: Nurse Practitioner

## 2021-05-31 ENCOUNTER — Other Ambulatory Visit: Payer: Self-pay | Admitting: Neurology

## 2021-05-31 MED ORDER — DEXTROAMPHETAMINE SULFATE ER 10 MG PO CP24
20.0000 mg | ORAL_CAPSULE | Freq: Every day | ORAL | 0 refills | Status: DC
Start: 2021-05-31 — End: 2021-07-01

## 2021-05-31 NOTE — Telephone Encounter (Signed)
Pt is requesting a refill for dextroamphetamine (DEXEDRINE) 10 MG 24 hr capsule.  Pharmacy: [id number] -   

## 2021-06-18 ENCOUNTER — Ambulatory Visit: Payer: Federal, State, Local not specified - PPO | Admitting: Nurse Practitioner

## 2021-06-18 ENCOUNTER — Telehealth: Payer: Self-pay | Admitting: Nurse Practitioner

## 2021-06-18 NOTE — Telephone Encounter (Signed)
Pt was a no show on 06/18/21 for an OV with [place], I sent a no show letter..  

## 2021-07-01 ENCOUNTER — Other Ambulatory Visit: Payer: Self-pay | Admitting: Neurology

## 2021-07-01 MED ORDER — DEXTROAMPHETAMINE SULFATE ER 10 MG PO CP24: 20.0000 mg | ORAL_CAPSULE | Freq: Every day | ORAL | 0 refills | Status: DC

## 2021-07-01 NOTE — Telephone Encounter (Signed)
Last OV was on 02/03/21.  Next OV is scheduled for 08/10/21.  Last RX was written on 06/02/21 for 120 tabs.   [place] Drug Database has been reviewed.

## 2021-07-02 NOTE — Telephone Encounter (Signed)
Pt notes she cancelled via automated reminder call. She is requesting f/u in July and will f/u with dates for appt

## 2021-07-22 ENCOUNTER — Telehealth: Payer: Self-pay | Admitting: *Deleted

## 2021-07-22 NOTE — Telephone Encounter (Signed)
Submitted PA on CMM. Key: [id number] Received instant approval effective 06/22/2021 to 07/22/2022.

## 2021-07-23 ENCOUNTER — Telehealth: Payer: Self-pay

## 2021-07-23 NOTE — Telephone Encounter (Signed)
Left patient a detailed voice message to return call to office for assistance with scheduling mammogram. 

## 2021-07-29 ENCOUNTER — Other Ambulatory Visit: Payer: Self-pay | Admitting: Neurology

## 2021-07-29 MED ORDER — DEXTROAMPHETAMINE SULFATE ER 10 MG PO CP24
20.0000 mg | ORAL_CAPSULE | Freq: Every day | ORAL | 0 refills | Status: DC
Start: 2021-07-29 — End: 2021-08-02

## 2021-07-29 NOTE — Telephone Encounter (Signed)
Pt request refill fordextroamphetamine (DEXEDRINE) 10 MG 24 hr capsule  at [id number]  

## 2021-07-30 ENCOUNTER — Telehealth: Payer: Self-pay | Admitting: Neurology

## 2021-07-30 NOTE — Telephone Encounter (Signed)
Pt said pharmacy does not have the dextroamphetamine (DEXEDRINE) 10 MG 24 hr capsule in stock. Have called a pharmacy that does have medication in stock. Would like refill resent to:  [facility] [address] [place], [place]  Phone: [phone]

## 2021-08-02 MED ORDER — DEXTROAMPHETAMINE SULFATE ER 10 MG PO CP24: 20.0000 mg | ORAL_CAPSULE | Freq: Every day | ORAL | 0 refills | Status: DC

## 2021-08-04 ENCOUNTER — Encounter: Payer: Self-pay | Admitting: Nurse Practitioner

## 2021-08-04 ENCOUNTER — Ambulatory Visit: Payer: Federal, State, Local not specified - PPO | Admitting: Nurse Practitioner

## 2021-08-04 VITALS — BP 118/78 | HR 102 | Temp 97.5°F | Ht 65.0 in | Wt 206.2 lb

## 2021-08-04 DIAGNOSIS — I10 Essential (primary) hypertension: Secondary | ICD-10-CM

## 2021-08-04 DIAGNOSIS — E785 Hyperlipidemia, unspecified: Secondary | ICD-10-CM

## 2021-08-04 DIAGNOSIS — E1169 Type 2 diabetes mellitus with other specified complication: Secondary | ICD-10-CM | POA: Diagnosis not present

## 2021-08-04 DIAGNOSIS — E559 Vitamin D deficiency, unspecified: Secondary | ICD-10-CM

## 2021-08-04 DIAGNOSIS — Z1211 Encounter for screening for malignant neoplasm of colon: Secondary | ICD-10-CM

## 2021-08-04 DIAGNOSIS — E1165 Type 2 diabetes mellitus with hyperglycemia: Secondary | ICD-10-CM | POA: Diagnosis not present

## 2021-08-04 LAB — LIPID PANEL
Cholesterol: 161 mg/dL (ref 0–200)
HDL: 52.9 mg/dL (ref >39.00)
LDL Cholesterol: 88 mg/dL (ref 0–99)
NonHDL: 107.64
Total CHOL/HDL Ratio: 3
Triglycerides: 97 mg/dL (ref 0.0–149.0)
VLDL: 19.4 mg/dL (ref 0.0–40.0)

## 2021-08-04 LAB — COMPREHENSIVE METABOLIC PANEL
ALT: 18 U/L (ref 0–35)
AST: 17 U/L (ref 0–37)
Albumin: 4.2 g/dL (ref 3.5–5.2)
Alkaline Phosphatase: 103 U/L (ref 39–117)
BUN: 9 mg/dL (ref 6–23)
CO2: 31 mEq/L (ref 19–32)
Calcium: 9.7 mg/dL (ref 8.4–10.5)
Chloride: 100 mEq/L (ref 96–112)
Creatinine, Ser: 0.74 mg/dL (ref 0.40–1.20)
GFR: 90.92 mL/min (ref >60.00)
Glucose, Bld: 135 mg/dL — ABNORMAL HIGH (ref 70–99)
Potassium: 3.5 mEq/L (ref 3.5–5.1)
Sodium: 139 mEq/L (ref 135–145)
Total Bilirubin: 0.6 mg/dL (ref 0.2–1.2)
Total Protein: 6.8 g/dL (ref 6.0–8.3)

## 2021-08-04 LAB — VITAMIN D 25 HYDROXY (VIT D DEFICIENCY, FRACTURES): VITD: 68.35 ng/mL (ref 30.00–100.00)

## 2021-08-04 LAB — HEMOGLOBIN A1C: Hgb A1c MFr Bld: 6.3 % (ref 4.6–6.5)

## 2021-08-04 NOTE — Patient Instructions (Addendum)
Go to lab Need to schedule appt with ophthalmology for annual Dm eye exam and for mammogram. Collect and submit cologuard kit.  

## 2021-08-04 NOTE — Assessment & Plan Note (Signed)
Repeat lipid panel ?

## 2021-08-04 NOTE — Assessment & Plan Note (Signed)
Repeat vit. D °

## 2021-08-04 NOTE — Assessment & Plan Note (Addendum)
No adverse effects with ozempic No home glucose reading Need to schedule appt with ophthalmology for annual Dm eye exam Completed foot exam today.  Repeat hgbA1c Maintain ozempic doese

## 2021-08-04 NOTE — Progress Notes (Signed)
Established Patient Visit  Patient: Cathy [name]   DOB: 1965/02/20   56 y.o. Female  MRN: [id number] Visit Date: 08/04/2021  Subjective:    Chief Complaint  Patient presents with   Office Visit    6 month f/u DM/ HTN/ Hyperlipidemia Doesn't check Blood sugar or BP No concerns Shingles given today   HPI Hyperlipidemia associated with type 2 diabetes mellitus (HCC) Repeat lipid panel  DM (diabetes mellitus) (HCC) No adverse effects with ozempic No home glucose reading Need to schedule appt with ophthalmology for annual Dm eye exam Completed foot exam today.  Repeat hgbA1c Maintain ozempic doese  Vitamin D deficiency Repeat vit. D  Wt Readings from Last 3 Encounters:  08/04/21 206 lb 3.2 oz (93.5 kg)  02/03/21 221 lb (100.2 kg)  09/02/20 214 lb (97.1 kg)    Reviewed medical, surgical, and social history today  Medications: Outpatient Medications Prior to Visit  Medication Sig   atorvastatin (LIPITOR) 40 MG tablet TAKE 1 TABLET(40 MG) BY MOUTH DAILY AT 6 PM   Cholecalciferol (VITAMIN D) 2000 UNITS CAPS Take 2,000 Units by mouth daily.   dextroamphetamine (DEXEDRINE) 10 MG 24 hr capsule Take 2-4 capsules (20-40 mg total) by mouth daily.   diclofenac Sodium (VOLTAREN) 1 % GEL Apply topically.   gabapentin (NEURONTIN) 300 MG capsule Take 1 capsule (300 mg total) by mouth 3 (three) times daily.   loratadine (CLARITIN) 10 MG tablet Take 10 mg by mouth daily.   losartan-hydrochlorothiazide (HYZAAR) 50-12.5 MG tablet Take 1 tablet by mouth daily.   Ozanimod HCl (ZEPOSIA) 0.92 MG CAPS Take 0.92 mg by mouth daily.   Semaglutide, 1 MG/DOSE, (OZEMPIC, 1 MG/DOSE,) 4 MG/3ML SOPN Inject 1 mg into the skin once a week.   triamcinolone cream (KENALOG) 0.1 % Apply topically 3 (three) times daily.   No facility-administered medications prior to visit.   Reviewed past medical and social history.   ROS per HPI above      Objective:  BP 118/78 (BP  Location: Right Arm, Patient Position: Sitting, Cuff Size: Normal)   Pulse (!) 102   Temp (!) 97.5 F (36.4 C) (Temporal)   Ht 5\' 5"  (1.651 m)   Wt 206 lb 3.2 oz (93.5 kg)   SpO2 97%   BMI 34.31 kg/m      Physical Exam Constitutional:      Appearance: She is obese.  Cardiovascular:     Rate and Rhythm: Normal rate and regular rhythm.     Pulses: Normal pulses.     Heart sounds: Normal heart sounds.  Pulmonary:     Effort: Pulmonary effort is normal.     Breath sounds: Normal breath sounds.  Abdominal:     General: Bowel sounds are normal.     Palpations: Abdomen is soft.  Neurological:     Mental Status: She is alert and oriented to person, place, and time.  Psychiatric:        Mood and Affect: Mood normal.        Behavior: Behavior normal.        Thought Content: Thought content normal.     No results found for any visits on 08/04/21.    Assessment & Plan:    Problem List Items Addressed This Visit       Cardiovascular and Mediastinum   Essential (primary) hypertension - Primary   Relevant Orders   Comprehensive metabolic panel  Endocrine   DM (diabetes mellitus) (HCC)    No adverse effects with ozempic No home glucose reading Need to schedule appt with ophthalmology for annual Dm eye exam Completed foot exam today.  Repeat hgbA1c Maintain ozempic doese      Relevant Orders   Hemoglobin A1c   Comprehensive metabolic panel   Urine microalbumin-creatinine with uACR   Hyperlipidemia associated with type 2 diabetes mellitus (HCC)    Repeat lipid panel      Relevant Orders   Comprehensive metabolic panel   Lipid panel     Other   Vitamin D deficiency    Repeat vit. D      Relevant Orders   Vitamin D (25 hydroxy)   Other Visit Diagnoses     Colon cancer screening       Relevant Orders   Cologuard      Return in about 3 months (around 11/04/2021) for DM and HTN, hyperlipidemia.     Alysia Penna, NP

## 2021-08-10 ENCOUNTER — Encounter: Payer: Self-pay | Admitting: Neurology

## 2021-08-10 ENCOUNTER — Ambulatory Visit: Payer: Federal, State, Local not specified - PPO | Admitting: Neurology

## 2021-08-10 VITALS — BP 141/83 | HR 95 | Ht 65.5 in | Wt 206.5 lb

## 2021-08-10 DIAGNOSIS — G35 Multiple sclerosis: Secondary | ICD-10-CM | POA: Diagnosis not present

## 2021-08-10 DIAGNOSIS — Z79899 Other long term (current) drug therapy: Secondary | ICD-10-CM | POA: Diagnosis not present

## 2021-08-10 NOTE — Progress Notes (Signed)
[facility]  PATIENT: Cathy [name] DOB: 09-Jul-1965  REFERRING DOCTOR OR PCP:  Synetta Fail  SOURCE: patient and medical records available  _________________________________   HISTORICAL  CHIEF COMPLAINT:  Chief Complaint  Patient presents with   Follow-up    Rm 2, alone. Here for 6 month f/u for MS, on Zeposia and tolerating well. MS stable.     HISTORY OF PRESENT ILLNESS:  Cathy [name] is a 56 y.o. woman with MS andgait issues.  Update 08/10/2021: She was on Gilenya and tolerates it well but switched to Zeposia due to high copay. .   She has no exacerbations recently.  Her last MRIs were stable.    MRI cervical  spine 04/15/2020 shows several older foci bit none new since prior MRI    No exacerbation pr new MS symptom.   Gait is about the same with a mild limp.   No falls but rare stumbles and se needs to be areful on stairs.    She notes mild right leg weakness.   She has some right leg tingling at times.      Bladder functon is fine.   Some constipation.    She notes no new vision issues and needs reading glasses.   Colors are dulled OD       Dextroamphetamine helps hr fatigue.    She has insomnia about 1/3 the nights.   Does not know if she snores    She notes reduced STM and attention/focus.   Also mild verbal processing issues.   She is on dextroamphetamine 10 mg 4 pills in the morning -  helps her attention and fatigue.    Mood is doing well for the most part.     She has lower back pain but no radicular/sciatica pain    She has lost 20 pounds with Ozempic for DM  MS History:   She was diagnosed with MS after presenting with optic neuritis x 2 in 2009.   She had an MRI and LP and was diagnosed with MS.     She started on Copaxone and switched to Gilenya in 2013.    She denies any further exacerbation since the optic neuritis but was told she had MS plaques on her spine when she presented last year with a cervical radiculopathy requiring  surgery.    I have seen her the past year at [facility].   Her last MRI's were Jan 21, 2014.  MRI images show foci in the cerebellum and the hemispheres consistent with MS. There were no acute findings. She has NIDDM, HTN and high cholesterol.  Imaging: MRI cervical spine 06/27/18 showed several T2 hyperintense foci within the spinal cord.  These are located posteriorly adjacent to C2-C3, laterally to the left adjacent to C2-C3, laterally to the right adjacent to C6 and posterolaterally to the right adjacent to C7.  The foci were present on the 01/29/2016 .  Also mild spinal stenosis at [facility] and C6C7.  She has C4-C6 fusion.  MRI Brain 06/27/18:   "There are T2/flair hyperintense foci in the hemispheres, left thalamus, left cerebellum and spinal cord in a pattern and configuration consistent with chronic demyelinating plaque associated with multiple sclerosis.  None of the foci are acute  MRI cervical spine 04/15/2020 showed patchy T2 hyperintensity within the cervical spine, discrete foci are located centrally adjacent to C2, posteriorly adjacent to C3, to the right adjacent to C6-C7 and to the right adjacent to C7-T1.  There did  not appear to be any new lesions compared to the MRI from 2020.   There is metal artifact consistent with C3-C6 ACDF, unchanged compared to the 2020 MRI  REVIEW OF SYSTEMS: Constitutional: No fevers, chills, sweats, or change in appetite.   Has fatigue Eyes: H/o right ON with residual color desaturation Ear, nose and throat: No hearing loss, ear pain, nasal congestion, sore throat Cardiovascular: No chest pain, palpitations Respiratory:  No shortness of breath at rest or with exertion.   No wheezes GastrointestinaI: No nausea, vomiting, diarrhea, abdominal pain, fecal incontinence Genitourinary:  No dysuria, urinary retention or frequency.  No nocturia. Musculoskeletal:  No neck pain, back pain,. Notes left shoulder pain. Integumentary: No rash, pruritus, skin  lesions Neurological: as above Psychiatric: No depression at this time.  No anxiety Endocrine: has NIDDM.  No palpitations, diaphoresis, change in appetite, change in weigh or increased thirst Hematologic/Lymphatic:  No anemia, purpura, petechiae. Allergic/Immunologic: No itchy/runny eyes, nasal congestion, recent allergic reactions, rashes  ALLERGIES: Allergies  Allergen Reactions   Aspirin     Heartburn   Ibuprofen     heatburn    HOME MEDICATIONS:  Current Outpatient Medications:    atorvastatin (LIPITOR) 40 MG tablet, TAKE 1 TABLET(40 MG) BY MOUTH DAILY AT 6 PM, Disp: 90 tablet, Rfl: 3   Cholecalciferol (VITAMIN D) 2000 UNITS CAPS, Take 2,000 Units by mouth daily., Disp: , Rfl:    dextroamphetamine (DEXEDRINE) 10 MG 24 hr capsule, Take 2-4 capsules (20-40 mg total) by mouth daily., Disp: 120 capsule, Rfl: 0   diclofenac Sodium (VOLTAREN) 1 % GEL, Apply topically., Disp: , Rfl:    gabapentin (NEURONTIN) 300 MG capsule, Take 1 capsule (300 mg total) by mouth 3 (three) times daily., Disp: 90 capsule, Rfl: 11   loratadine (CLARITIN) 10 MG tablet, Take 10 mg by mouth daily., Disp: , Rfl:    losartan-hydrochlorothiazide (HYZAAR) 50-12.5 MG tablet, Take 1 tablet by mouth daily., Disp: 90 tablet, Rfl: 1   Ozanimod HCl (ZEPOSIA) 0.92 MG CAPS, Take 0.92 mg by mouth daily., Disp: , Rfl:    Semaglutide, 1 MG/DOSE, (OZEMPIC, 1 MG/DOSE,) 4 MG/3ML SOPN, Inject 1 mg into the skin once a week., Disp: 12 mL, Rfl: 2   triamcinolone cream (KENALOG) 0.1 %, Apply topically 3 (three) times daily., Disp: 45 g, Rfl: 1  PAST MEDICAL HISTORY: Past Medical History:  Diagnosis Date   Diabetes mellitus without complication ([place])    Eczema    Hypertension    Multiple sclerosis ([place])    Vision abnormalities     PAST SURGICAL HISTORY: Past Surgical History:  Procedure Laterality Date   ABDOMINAL HYSTERECTOMY     CARPAL TUNNEL RELEASE     CERVICAL SPINE SURGERY     Dr. Rennis Harding   LASER ABLATION      TENDON REPAIR Left    wrist surgery   TUBAL LIGATION      FAMILY HISTORY: Family History  Problem Relation Age of Onset   Hypertension Mother    Diabetes type II Mother    Stroke Mother    COPD Father    Multiple sclerosis Paternal Uncle    Multiple sclerosis Paternal Grandfather     SOCIAL HISTORY:  Social History   Socioeconomic History   Marital status: Married    Spouse name: Anothony   Number of children: 3   Years of education: 12+   Highest education level: Not on file  Occupational History   Not on file  Tobacco Use  Smoking status: Never   Smokeless tobacco: Never  Vaping Use   Vaping Use: Never used  Substance and Sexual Activity   Alcohol use: Yes    Alcohol/week: 1.0 standard drink of alcohol    Types: 1 Glasses of wine per week    Comment: occasionally   Drug use: No   Sexual activity: Yes    Birth control/protection: Surgical  Other Topics Concern   Not on file  Social History Narrative   Right handed    Caffeine ZOX:WRUE sometimes.   Lives with husband,  Ethelene Browns   Social Determinants of Health   Financial Resource Strain: Not on file  Food Insecurity: Not on file  Transportation Needs: Not on file  Physical Activity: Not on file  Stress: Not on file  Social Connections: Not on file  Intimate Partner Violence: Not on file     PHYSICAL EXAM  Vitals:   08/10/21 1603  BP: (!) 141/83  Pulse: 95  Weight: 206 lb 8 oz (93.7 kg)  Height: 5' 5.5" (1.664 m)    Body mass index is 33.84 kg/m.   General: The patient is well-developed and well-nourished and in no acute distress.      Neurologic Exam  Mental status: The patient is alert and oriented x 3 at the time of the examination. The patient has apparent normal recent and remote memory, with an apparently normal attention span and concentration ability.   Speech is normal.  Cranial nerves: Extraocular movements are full.  Facial strength and sensation was normal.  Trapezius  strength is normal.     No obvious hearing deficits are noted.  Motor:  Muscle bulk is normal.   Muscle tone is mildly increased in the right leg and strength was 4+/5 in the right foot and left triceps.  .  Strength is 5/5 elsewhere.      Sensory: intact touch/vibration sensation  Coordination: Cerebellar testing reveals good finger-nose-finger and reduced right heel to shin.  Gait and station: Station is normal.   Gait is mildly wide.  Tandem gait is moderately wide...  Romberg is negative.  Reflexes: Deep tendon reflexes are symmetric and normal bilaterally.       No diagnosis found.    1.   Continue Zeposis for MS.  We will check blood work today.    MRi in 2024 2.   Continue  Dexedrine for fatigue and attention deficit.    Consider a PSG if sleepiness, fatigue and memory issues do not improve 3.   Try to stay active and exercise as tolerated.   4.   Return in 6 months or sooner if there are new or worsening neurologic symptoms.   Cathy [name] A. Epimenio Foot, MD, PhD 08/10/2021, 4:16 PM Certified in Neurology, Clinical Neurophysiology, Sleep Medicine, Pain Medicine and Neuroimaging  [facility] [address] [place], [place] [address] [phone] -

## 2021-08-11 LAB — CBC WITH DIFFERENTIAL/PLATELET
Basophils Absolute: 0 10*3/uL (ref 0.0–0.2)
Basos: 1 %
EOS (ABSOLUTE): 0.1 10*3/uL (ref 0.0–0.4)
Eos: 2 %
Hematocrit: 37.8 % (ref 34.0–46.6)
Hemoglobin: 12.6 g/dL (ref 11.1–15.9)
Immature Grans (Abs): 0 10*3/uL (ref 0.0–0.1)
Immature Granulocytes: 0 %
Lymphocytes Absolute: 0.9 10*3/uL (ref 0.7–3.1)
Lymphs: 17 %
MCH: 28.8 pg (ref 26.6–33.0)
MCHC: 33.3 g/dL (ref 31.5–35.7)
MCV: 86 fL (ref 79–97)
Monocytes Absolute: 0.7 10*3/uL (ref 0.1–0.9)
Monocytes: 14 %
Neutrophils Absolute: 3.5 10*3/uL (ref 1.4–7.0)
Neutrophils: 66 %
Platelets: 408 10*3/uL (ref 150–450)
RBC: 4.38 x10E6/uL (ref 3.77–5.28)
RDW: 14.1 % (ref 11.7–15.4)
WBC: 5.3 10*3/uL (ref 3.4–10.8)

## 2021-09-01 ENCOUNTER — Other Ambulatory Visit: Payer: Self-pay | Admitting: Neurology

## 2021-09-01 MED ORDER — DEXTROAMPHETAMINE SULFATE ER 10 MG PO CP24: 20.0000 mg | ORAL_CAPSULE | Freq: Every day | ORAL | 0 refills | Status: DC

## 2021-09-01 MED ORDER — GABAPENTIN 300 MG PO CAPS: 300.0000 mg | ORAL_CAPSULE | Freq: Three times a day (TID) | ORAL | 11 refills | Status: DC

## 2021-09-01 NOTE — Telephone Encounter (Signed)
Pt last seen 08/10/21 and next f/u 02/10/21.  E-scribed gabapentin refill. Checked drug registry. She last refilled 08/02/21 #120.

## 2021-09-01 NOTE — Telephone Encounter (Signed)
Pt is calling and requesting a refill on gabapentin (NEURONTIN) 300 MG capsule and  dextroamphetamine (DEXEDRINE) 10 MG 24 hr capsule. Pt is requesting prescription be sent to [facility] [phone]

## 2021-09-02 ENCOUNTER — Other Ambulatory Visit: Payer: Federal, State, Local not specified - PPO

## 2021-09-02 ENCOUNTER — Ambulatory Visit (INDEPENDENT_AMBULATORY_CARE_PROVIDER_SITE_OTHER): Payer: Federal, State, Local not specified - PPO

## 2021-09-02 DIAGNOSIS — E1165 Type 2 diabetes mellitus with hyperglycemia: Secondary | ICD-10-CM

## 2021-09-02 DIAGNOSIS — Z23 Encounter for immunization: Secondary | ICD-10-CM | POA: Diagnosis not present

## 2021-09-02 LAB — MICROALBUMIN / CREATININE URINE RATIO
Creatinine,U: 64.5 mg/dL
Microalb Creat Ratio: 1.1 mg/g (ref 0.0–30.0)
Microalb, Ur: <0.7 mg/dL (ref 0.0–1.9)

## 2021-09-02 NOTE — Progress Notes (Signed)
Per orders of Alysia Penna Np pt is here for Shingles 2nd dose of Shingles vaccine. Pt received 2nd dose shingles vaccine in left deltoid  at 09:15 am. Given by [place] L. CMA/CPT, Pt tolerated 2nd dose shingles vaccine well. Dose 2/2. Series completed.

## 2021-09-24 ENCOUNTER — Other Ambulatory Visit: Payer: Self-pay | Admitting: Nurse Practitioner

## 2021-09-24 DIAGNOSIS — I10 Essential (primary) hypertension: Secondary | ICD-10-CM

## 2021-09-24 NOTE — Telephone Encounter (Signed)
Chart supports Rx Last OV: 07/2021 Next OV: not yet scheduled   

## 2021-09-30 ENCOUNTER — Other Ambulatory Visit: Payer: Self-pay | Admitting: Neurology

## 2021-09-30 MED ORDER — DEXTROAMPHETAMINE SULFATE ER 10 MG PO CP24: 20.0000 mg | ORAL_CAPSULE | Freq: Every day | ORAL | 0 refills | Status: DC

## 2021-09-30 NOTE — Telephone Encounter (Signed)
Pt request refill fordextroamphetamine (DEXEDRINE) 10 MG 24 hr capsule  at [id number]  

## 2021-10-01 ENCOUNTER — Other Ambulatory Visit: Payer: Self-pay | Admitting: Neurology

## 2021-10-03 NOTE — Telephone Encounter (Signed)
PLEASE DENY, THIS IS A DUPLICATE. YOU ALREADY SENT IT IN ON 09/30/21

## 2021-10-04 IMAGING — MG DIGITAL SCREENING BILAT W/ CAD
5 series · 5 of 5 positions shown · non-contrast
Comparison: Previous exam(s).

CLINICAL DATA: Screening.

EXAM:
DIGITAL SCREENING BILATERAL MAMMOGRAM WITH CAD

[R MLO]
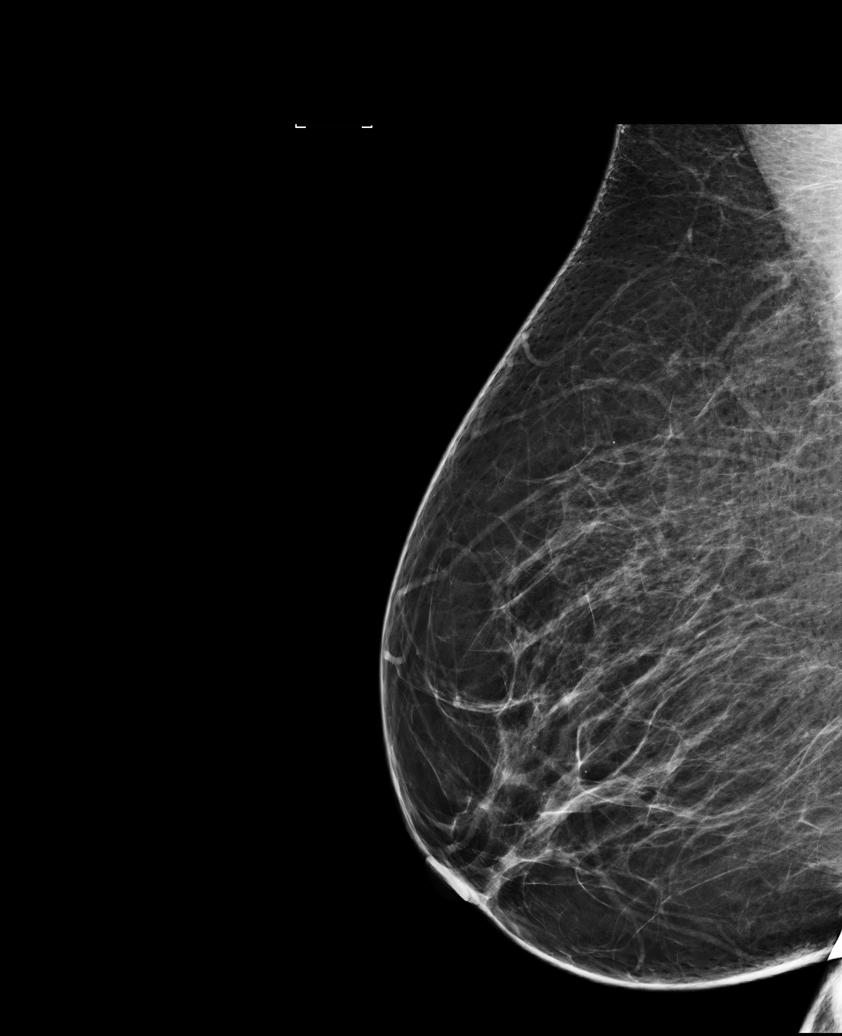

[R CC]
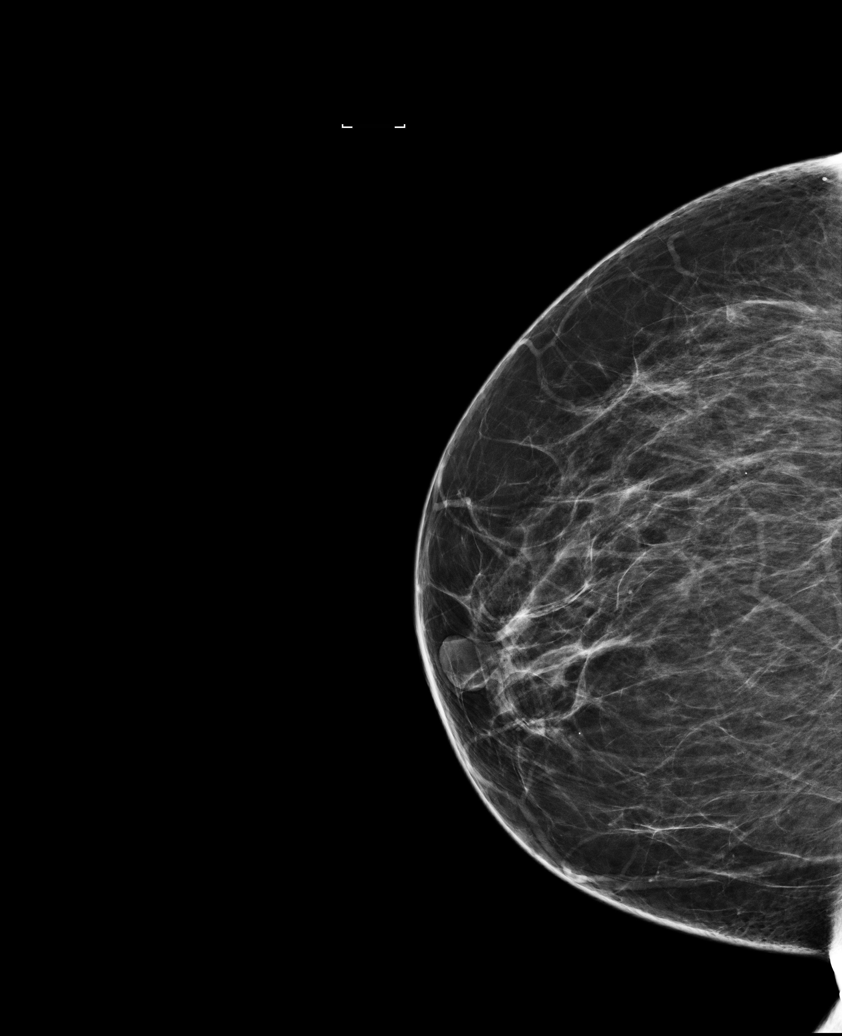

[L CC (1 of 2)]
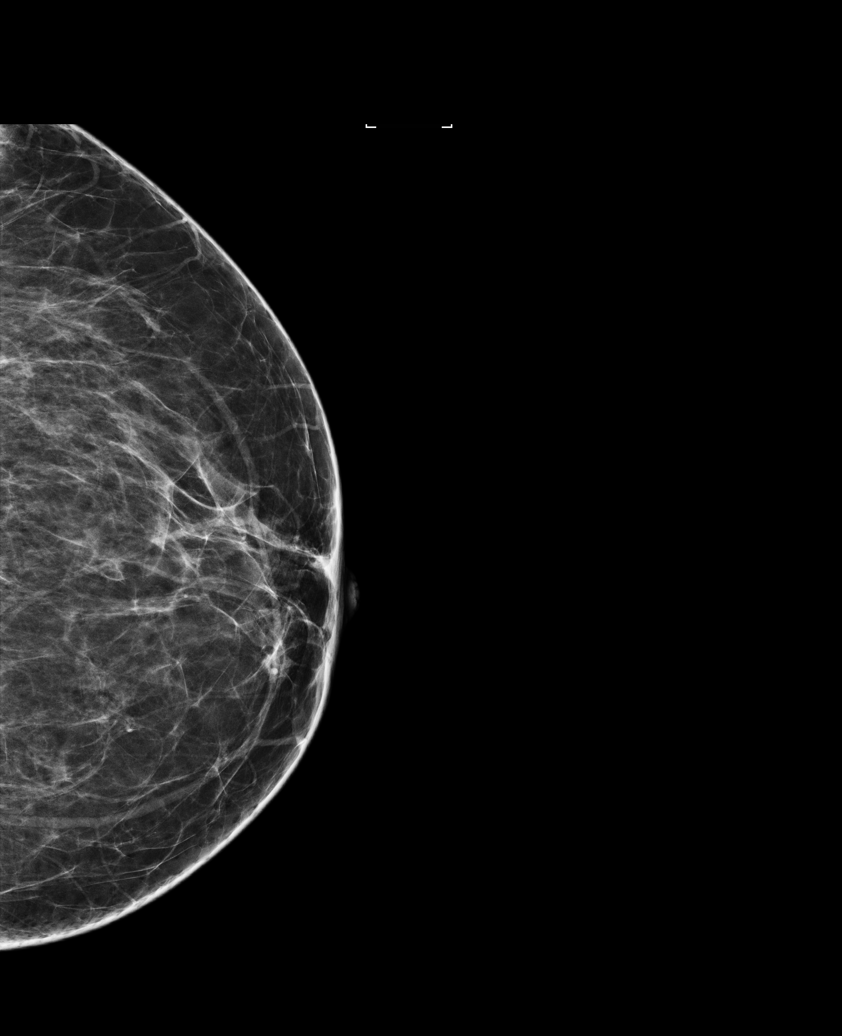

[L CC (2 of 2)]
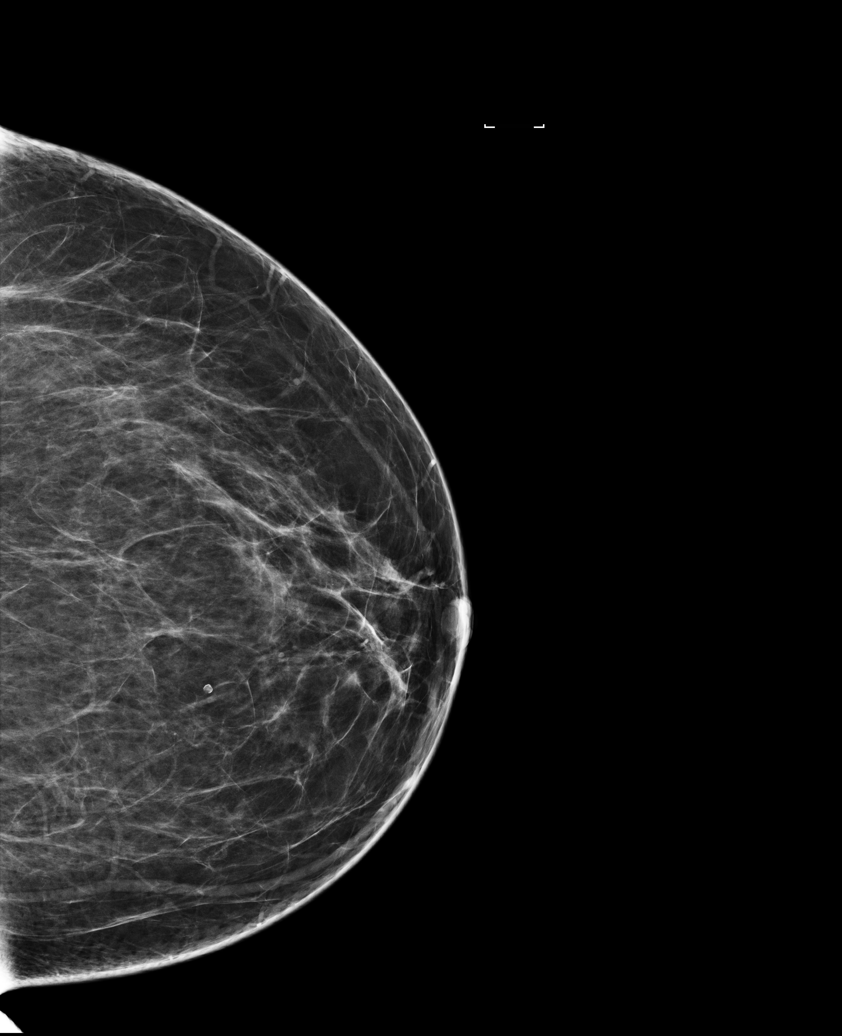

[L MLO]
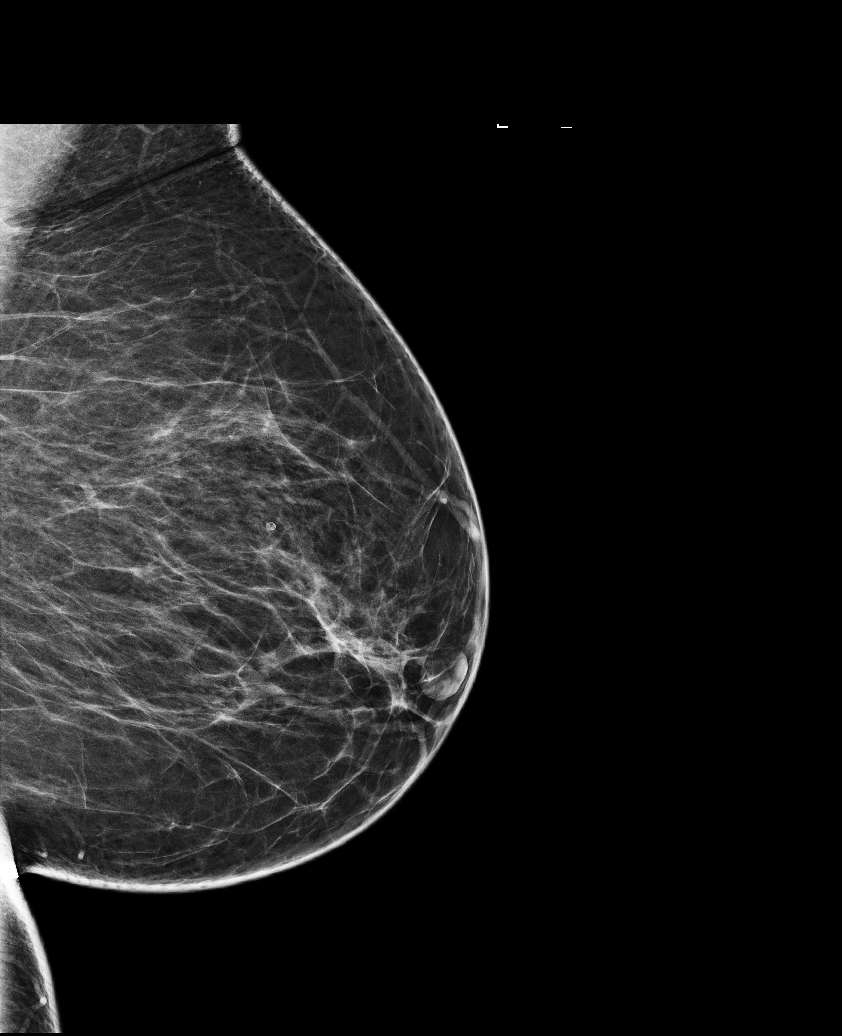

[5 of 5 positions shown; findings below may reference images not displayed]

ACR Breast Density Category b: There are scattered areas of
fibroglandular density.
FINDINGS: There are no findings suspicious for malignancy. Images were
processed with CAD.
IMPRESSION: No mammographic evidence of malignancy. A result letter of this
screening mammogram will be mailed directly to the patient.

RECOMMENDATION:
Screening mammogram in one year. (Code:AS-G-LCT)

BI-RADS CATEGORY  1: Negative.

## 2021-10-04 MED ORDER — DEXTROAMPHETAMINE SULFATE ER 10 MG PO CP24: 20.0000 mg | ORAL_CAPSULE | Freq: Every day | ORAL | 0 refills | Status: DC

## 2021-10-28 ENCOUNTER — Encounter: Payer: Self-pay | Admitting: Nurse Practitioner

## 2021-10-28 ENCOUNTER — Telehealth (INDEPENDENT_AMBULATORY_CARE_PROVIDER_SITE_OTHER): Payer: Federal, State, Local not specified - PPO | Admitting: Nurse Practitioner

## 2021-10-28 VITALS — BP 136/85 | HR 77 | Ht 65.5 in | Wt 206.0 lb

## 2021-10-28 DIAGNOSIS — R232 Flushing: Secondary | ICD-10-CM

## 2021-10-28 DIAGNOSIS — Z1211 Encounter for screening for malignant neoplasm of colon: Secondary | ICD-10-CM

## 2021-10-28 DIAGNOSIS — E1165 Type 2 diabetes mellitus with hyperglycemia: Secondary | ICD-10-CM | POA: Diagnosis not present

## 2021-10-28 MED ORDER — PAROXETINE HCL 10 MG PO TABS: 10.0000 mg | ORAL_TABLET | Freq: Every day | ORAL | 5 refills | Status: DC

## 2021-10-28 MED ORDER — OZEMPIC (1 MG/DOSE) 4 MG/3ML ~~LOC~~ SOPN: 1.0000 mg | PEN_INJECTOR | SUBCUTANEOUS | 3 refills | Status: DC

## 2021-10-28 NOTE — Progress Notes (Addendum)
Virtual Visit via Video Note  I connected withNAME@ on 10/28/21 at 11:00 AM EDT by a video enabled telemedicine application and verified that I am speaking with the correct person using two identifiers.  Location: Patient:Home Provider: Office Participants: patient and provider  I discussed the limitations of evaluation and management by telemedicine and the availability of in person appointments. I also discussed with the patient that there may be a patient responsible charge related to this service. The patient expressed understanding and agreed to proceed.  CC:hot flashes  History of Present Illness: Hot flashes Chronic, ongoing for several years S/p hysterectomy, ovaries present. No new medication or change in dose Normal TSH, CBC. CMP Check CXR Start paxil 10mg  at hs   Review of Systems  Constitutional:  Negative for chills, fever, malaise/fatigue and weight loss.  Respiratory: Negative.    Cardiovascular: Negative.   Musculoskeletal: Negative.   Skin: Negative.   Neurological:  Negative for dizziness and focal weakness.  Endo/Heme/Allergies:  Does not bruise/bleed easily.  Psychiatric/Behavioral: Negative.      Assessment and Plan: Karma was seen today for acute visit.  Diagnoses and all orders for this visit:  Hot flashes -     DG Chest 2 View; Future -     PARoxetine (PAXIL) 10 MG tablet; Take 1 tablet (10 mg total) by mouth at bedtime. -     TSH; Future  Type 2 diabetes mellitus with hyperglycemia, without long-term current use of insulin (HCC) -     Semaglutide, 1 MG/DOSE, (OZEMPIC, 1 MG/DOSE,) 4 MG/3ML SOPN; Inject 1 mg into the skin once a week.  Colon cancer screening -     Cologuard; Future   Follow Up Instructions: See instructions above   I discussed the assessment and treatment plan with the patient. The patient was provided an opportunity to ask questions and all were answered. The patient agreed with the plan and demonstrated an understanding  of the instructions.   The patient was advised to call back or seek an in-person evaluation if the symptoms worsen or if the condition fails to improve as anticipated.  Wilfred Lacy, NP

## 2021-10-28 NOTE — Assessment & Plan Note (Addendum)
Chronic, ongoing for several years S/p hysterectomy, ovaries present. No new medication or change in dose Normal CBC. CMP Check CXR and TSH Start paxil 10mg  at hs

## 2021-10-28 NOTE — Assessment & Plan Note (Signed)
Home glucose 110s No adverse effects with ozempic Last hgbA1c at 6.3% (07/2021)  ozempic refill sent

## 2021-11-01 ENCOUNTER — Other Ambulatory Visit: Payer: Self-pay | Admitting: *Deleted

## 2021-11-01 ENCOUNTER — Telehealth: Payer: Self-pay | Admitting: Neurology

## 2021-11-01 ENCOUNTER — Encounter: Payer: Self-pay | Admitting: Nurse Practitioner

## 2021-11-01 ENCOUNTER — Other Ambulatory Visit (INDEPENDENT_AMBULATORY_CARE_PROVIDER_SITE_OTHER): Payer: Federal, State, Local not specified - PPO

## 2021-11-01 DIAGNOSIS — R232 Flushing: Secondary | ICD-10-CM

## 2021-11-01 MED ORDER — DEXTROAMPHETAMINE SULFATE ER 10 MG PO CP24: 20.0000 mg | ORAL_CAPSULE | Freq: Every day | ORAL | 0 refills | Status: DC

## 2021-11-01 NOTE — Telephone Encounter (Signed)
Last seen 08/10/21 and next f/u 02/10/22. Per drug registry, last refilled 09/30/21 #120.  Sent to covering MD, Dr. Brett Fairy since Dr. Felecia Shelling out.

## 2021-11-01 NOTE — Telephone Encounter (Signed)
Pt is requesting a refill for dextroamphetamine (DEXEDRINE) 10 MG 24 hr capsule.  Pharmacy: [facility] [phone] -

## 2021-11-02 LAB — TSH: TSH: 1.94 u[IU]/mL (ref 0.35–5.50)

## 2021-11-10 ENCOUNTER — Telehealth: Payer: Federal, State, Local not specified - PPO | Admitting: Nurse Practitioner

## 2021-11-11 DIAGNOSIS — E119 Type 2 diabetes mellitus without complications: Secondary | ICD-10-CM | POA: Diagnosis not present

## 2021-11-11 DIAGNOSIS — H43393 Other vitreous opacities, bilateral: Secondary | ICD-10-CM | POA: Diagnosis not present

## 2021-11-11 DIAGNOSIS — Z7984 Long term (current) use of oral hypoglycemic drugs: Secondary | ICD-10-CM | POA: Diagnosis not present

## 2021-11-11 DIAGNOSIS — H2513 Age-related nuclear cataract, bilateral: Secondary | ICD-10-CM | POA: Diagnosis not present

## 2021-11-11 LAB — HM DIABETES EYE EXAM

## 2021-12-02 ENCOUNTER — Other Ambulatory Visit: Payer: Self-pay | Admitting: Neurology

## 2021-12-02 MED ORDER — DEXTROAMPHETAMINE SULFATE ER 10 MG PO CP24: 20.0000 mg | ORAL_CAPSULE | Freq: Every day | ORAL | 0 refills | Status: DC

## 2021-12-02 NOTE — Telephone Encounter (Signed)
Pt request refill fordextroamphetamine (DEXEDRINE) 10 MG 24 hr capsule  at [facility] [phone]

## 2021-12-18 ENCOUNTER — Encounter: Payer: Self-pay | Admitting: Nurse Practitioner

## 2021-12-31 ENCOUNTER — Telehealth: Payer: Self-pay | Admitting: Neurology

## 2021-12-31 MED ORDER — DEXTROAMPHETAMINE SULFATE ER 10 MG PO CP24: 20.0000 mg | ORAL_CAPSULE | Freq: Every day | ORAL | 0 refills | Status: DC

## 2021-12-31 NOTE — Telephone Encounter (Signed)
Pt put for a refill for  dextroamphetamine (DEXEDRINE) 10 MG 24 hr capsule this morning, but forgot to tell the phone rep she will be taking her last dose today and need a refill for tomorrow. Want to know if on call physician can do the refill for her. If on call physician able to do refill can send to [facility] [phone]

## 2021-12-31 NOTE — Telephone Encounter (Signed)
Pt is calling. Requesting refill on medication  dextroamphetamine (DEXEDRINE) 10 MG 24 hr capsule. Refill should be sent to  [facility] [phone]

## 2022-01-03 NOTE — Telephone Encounter (Signed)
Refill sent on 12/31/2021 by Dr. Epimenio Foot.

## 2022-01-03 NOTE — Telephone Encounter (Signed)
Reviewed pt chart. Dr. Epimenio Foot already e-scribed to pharmacy 12/31/21.

## 2022-01-27 ENCOUNTER — Other Ambulatory Visit: Payer: Self-pay | Admitting: *Deleted

## 2022-01-27 MED ORDER — ZEPOSIA 0.92 MG PO CAPS: 0.9200 mg | ORAL_CAPSULE | Freq: Every day | ORAL | 0 refills | Status: DC

## 2022-01-27 NOTE — Telephone Encounter (Signed)
Patient has follow up scheduled on 02/10/22 30 day supply sent.

## 2022-01-31 ENCOUNTER — Other Ambulatory Visit: Payer: Self-pay | Admitting: Neurology

## 2022-01-31 MED ORDER — DEXTROAMPHETAMINE SULFATE ER 10 MG PO CP24: 20.0000 mg | ORAL_CAPSULE | Freq: Every day | ORAL | 0 refills | Status: DC

## 2022-01-31 NOTE — Addendum Note (Signed)
Addended by: Darleen Crocker on: 01/31/2022 01:14 PM   Modules accepted: Orders

## 2022-01-31 NOTE — Telephone Encounter (Signed)
Pt is calling. Requesting a refill on medication dextroamphetamine (DEXEDRINE) 10 MG 24 hr capsule. Refill should be sent to [facility] [phone]

## 2022-01-31 NOTE — Telephone Encounter (Signed)
Per 08/10/21 office note "Continue Dexedrine for fatigue and attention deficit.   Follow scheduled on 02/10/22

## 2022-02-03 ENCOUNTER — Encounter: Payer: Self-pay | Admitting: Family Medicine

## 2022-02-04 ENCOUNTER — Other Ambulatory Visit (HOSPITAL_COMMUNITY): Payer: Self-pay

## 2022-02-04 NOTE — Telephone Encounter (Signed)
Per test claim, PA not needed

## 2022-02-07 ENCOUNTER — Telehealth: Payer: Self-pay

## 2022-02-07 ENCOUNTER — Other Ambulatory Visit (HOSPITAL_COMMUNITY): Payer: Self-pay

## 2022-02-07 NOTE — Telephone Encounter (Signed)
Look at Message from today 02/07/2022

## 2022-02-08 ENCOUNTER — Ambulatory Visit (INDEPENDENT_AMBULATORY_CARE_PROVIDER_SITE_OTHER): Payer: Federal, State, Local not specified - PPO | Admitting: Nurse Practitioner

## 2022-02-08 ENCOUNTER — Encounter: Payer: Self-pay | Admitting: Nurse Practitioner

## 2022-02-08 VITALS — BP 126/82 | HR 100 | Temp 97.1°F | Ht 65.5 in | Wt 203.2 lb

## 2022-02-08 DIAGNOSIS — E785 Hyperlipidemia, unspecified: Secondary | ICD-10-CM

## 2022-02-08 DIAGNOSIS — Z1211 Encounter for screening for malignant neoplasm of colon: Secondary | ICD-10-CM | POA: Diagnosis not present

## 2022-02-08 DIAGNOSIS — E1169 Type 2 diabetes mellitus with other specified complication: Secondary | ICD-10-CM

## 2022-02-08 DIAGNOSIS — R232 Flushing: Secondary | ICD-10-CM | POA: Diagnosis not present

## 2022-02-08 DIAGNOSIS — E1165 Type 2 diabetes mellitus with hyperglycemia: Secondary | ICD-10-CM | POA: Diagnosis not present

## 2022-02-08 DIAGNOSIS — Z1231 Encounter for screening mammogram for malignant neoplasm of breast: Secondary | ICD-10-CM

## 2022-02-08 DIAGNOSIS — Z0001 Encounter for general adult medical examination with abnormal findings: Secondary | ICD-10-CM | POA: Diagnosis not present

## 2022-02-08 DIAGNOSIS — G35 Multiple sclerosis: Secondary | ICD-10-CM

## 2022-02-08 LAB — COMPREHENSIVE METABOLIC PANEL
ALT: 28 U/L (ref 0–35)
AST: 18 U/L (ref 0–37)
Albumin: 4.4 g/dL (ref 3.5–5.2)
Alkaline Phosphatase: 102 U/L (ref 39–117)
BUN: 14 mg/dL (ref 6–23)
CO2: 29 mEq/L (ref 19–32)
Calcium: 9.9 mg/dL (ref 8.4–10.5)
Chloride: 102 mEq/L (ref 96–112)
Creatinine, Ser: 0.75 mg/dL (ref 0.40–1.20)
GFR: 89.14 mL/min (ref >60.00)
Glucose, Bld: 95 mg/dL (ref 70–99)
Potassium: 3.7 mEq/L (ref 3.5–5.1)
Sodium: 140 mEq/L (ref 135–145)
Total Bilirubin: 0.6 mg/dL (ref 0.2–1.2)
Total Protein: 6.6 g/dL (ref 6.0–8.3)

## 2022-02-08 LAB — LDL CHOLESTEROL, DIRECT: Direct LDL: 84 mg/dL

## 2022-02-08 LAB — HEMOGLOBIN A1C: Hgb A1c MFr Bld: 6 % (ref 4.6–6.5)

## 2022-02-08 NOTE — Assessment & Plan Note (Addendum)
Fasting glucose 120s-130s No adverse effects with ozempic Repeat hgbA1c

## 2022-02-08 NOTE — Progress Notes (Addendum)
Complete physical exam  Patient: Cathy [name]   DOB: 05-12-1965   57 y.o. Female  MRN: [id number] Visit Date: 02/12/2022  Subjective:    Chief Complaint  Patient presents with   Annual Exam    CPE Pt not fasting  Needs order placed for mobile mammo    Cathy [name] is a 57 y.o. female who presents today for a complete physical exam. She reports consuming a general diet.  No exercise regimen  She generally feels well. She reports sleeping well. She does not have additional problems to discuss today.  Vision:Yes Dental:Yes STD Screen:No  Wt Readings from Last 3 Encounters:  02/10/22 203 lb (92.1 kg)  02/08/22 203 lb 3.2 oz (92.2 kg)  10/28/21 206 lb (93.4 kg)    BP Readings from Last 3 Encounters:  02/10/22 124/77  02/08/22 126/82  10/28/21 136/85    Most recent fall risk assessment:    02/24/2020    9:31 AM  Fall Risk   Falls in the past year? 0  Number falls in past yr: 0  Injury with Fall? 0  Risk for fall due to : No Fall Risks  Follow up Falls evaluation completed   Depression screen:Yes - No Depression  Most recent depression screenings:    02/08/2022    2:26 PM 09/02/2020    9:09 AM  PHQ 2/9 Scores  PHQ - 2 Score 0 0  PHQ- 9 Score 7    HPI  DM (diabetes mellitus) (HCC) Fasting glucose 120s-130s No adverse effects with ozempic Repeat hgbA1c  Hot flashes Declined to continue paxil  Hyperlipidemia associated with type 2 diabetes mellitus (HCC) Repeat ldl: Elevated LDL with risk for cardiac and cerebrovascular disease at 9.4%: change atorvastatin to crestor 10mg . New rx sent  Multiple sclerosis (HCC) Current use of zeposia Under the care of Dr.  Past Medical History:  Diagnosis Date   Diabetes mellitus without complication (HCC)    Eczema    Hypertension    Multiple sclerosis (HCC)    Vision abnormalities    Past Surgical History:  Procedure Laterality Date   ABDOMINAL HYSTERECTOMY     CARPAL TUNNEL  RELEASE     CERVICAL SPINE SURGERY     Dr. Epimenio Foot   LASER ABLATION     TENDON REPAIR Left    wrist surgery   TUBAL LIGATION     Social History   Socioeconomic History   Marital status: Married    Spouse name: Anothony   Number of children: 3   Years of education: 12+   Highest education level: Not on file  Occupational History   Not on file  Tobacco Use   Smoking status: Never   Smokeless tobacco: Never  Vaping Use   Vaping Use: Never used  Substance and Sexual Activity   Alcohol use: Yes    Alcohol/week: 1.0 standard drink of alcohol    Types: 1 Glasses of wine per week    Comment: occasionally   Drug use: No   Sexual activity: Yes    Birth control/protection: Surgical  Other Topics Concern   Not on file  Social History Narrative   Right handed    Caffeine Sharolyn Douglas sometimes.   Lives with husband,  [id number]   Social Determinants of Health   Financial Resource Strain: Not on file  Food Insecurity: Not on file  Transportation Needs: Not on file  Physical Activity: Not on file  Stress: Not on file  Social Connections: Not  on file  Intimate Partner Violence: Not on file   Family Status  Relation Name Status   Mother  Alive   Father  Alive   Annamarie Major  Deceased   PGF  Deceased   Family History  Problem Relation Age of Onset   Hypertension Mother    Diabetes type II Mother    Stroke Mother    COPD Father    Multiple sclerosis Paternal Uncle    Multiple sclerosis Paternal Grandfather    Allergies  Allergen Reactions   Aspirin     Heartburn   Ibuprofen     heatburn    Patient Care Team: Demetric Dunnaway, Charlene Brooke, NP as PCP - General (Internal Medicine)   Medications: Outpatient Medications Prior to Visit  Medication Sig   Cholecalciferol (VITAMIN D) 2000 UNITS CAPS Take 2,000 Units by mouth daily.   dextroamphetamine (DEXEDRINE) 10 MG 24 hr capsule Take 2-4 capsules (20-40 mg total) by mouth daily.   diclofenac Sodium (VOLTAREN) 1 % GEL Apply  topically.   gabapentin (NEURONTIN) 300 MG capsule Take 300 mg by mouth 3 (three) times daily.   loratadine (CLARITIN) 10 MG tablet Take 10 mg by mouth daily.   losartan-hydrochlorothiazide (HYZAAR) 50-12.5 MG tablet TAKE 1 TABLET BY MOUTH DAILY   Ozanimod HCl (ZEPOSIA) 0.92 MG CAPS Take 1 capsule (0.92 mg total) by mouth daily.   Semaglutide, 1 MG/DOSE, (OZEMPIC, 1 MG/DOSE,) 4 MG/3ML SOPN Inject 1 mg into the skin once a week.   triamcinolone cream (KENALOG) 0.1 % Apply topically 3 (three) times daily.   [DISCONTINUED] atorvastatin (LIPITOR) 40 MG tablet TAKE 1 TABLET(40 MG) BY MOUTH DAILY AT 6 PM   [DISCONTINUED] PARoxetine (PAXIL) 10 MG tablet Take 1 tablet (10 mg total) by mouth at bedtime. (Patient not taking: Reported on 02/08/2022)   No facility-administered medications prior to visit.    Review of Systems  Constitutional:  Negative for fever.  HENT:  Negative for congestion and sore throat.   Eyes:        Negative for visual changes  Respiratory:  Negative for cough and shortness of breath.   Cardiovascular:  Negative for chest pain, palpitations and leg swelling.  Gastrointestinal:  Negative for blood in stool, constipation and diarrhea.  Genitourinary:  Negative for dysuria, frequency and urgency.  Musculoskeletal:  Negative for arthralgias and myalgias.  Skin:  Negative for rash.  Neurological:  Negative for dizziness and headaches.  Hematological:  Does not bruise/bleed easily.  Psychiatric/Behavioral:  Negative for suicidal ideas. The patient is not nervous/anxious.         Objective:  BP 126/82 (BP Location: Right Arm, Patient Position: Sitting, Cuff Size: Normal)   Pulse 100   Temp (!) 97.1 F (36.2 C) (Temporal)   Ht 5' 5.5" (1.664 m)   Wt 203 lb 3.2 oz (92.2 kg)   SpO2 98%   BMI 33.30 kg/m     Physical Exam Vitals and nursing note reviewed.  Constitutional:      General: She is not in acute distress. HENT:     Right Ear: Tympanic membrane, ear canal and  external ear normal.     Left Ear: Tympanic membrane, ear canal and external ear normal.     Nose: Nose normal.     Mouth/Throat:     Pharynx: No oropharyngeal exudate.  Eyes:     General: No scleral icterus.    Extraocular Movements: Extraocular movements intact.     Conjunctiva/sclera: Conjunctivae normal.  Cardiovascular:  Rate and Rhythm: Normal rate and regular rhythm.     Pulses: Normal pulses.     Heart sounds: Normal heart sounds.  Pulmonary:     Effort: Pulmonary effort is normal. No respiratory distress.     Breath sounds: Normal breath sounds.  Chest:     Comments: Declined breast exam Abdominal:     General: Bowel sounds are normal. There is no distension.     Palpations: Abdomen is soft.  Musculoskeletal:        General: Normal range of motion.     Cervical back: Normal range of motion and neck supple.     Right lower leg: No edema.     Left lower leg: No edema.  Lymphadenopathy:     Cervical: No cervical adenopathy.  Skin:    General: Skin is warm and dry.  Neurological:     Mental Status: She is alert and oriented to person, place, and time.  Psychiatric:        Mood and Affect: Mood normal.        Behavior: Behavior normal.        Thought Content: Thought content normal.      Results for orders placed or performed in visit on 02/08/22  Comprehensive metabolic panel  Result Value Ref Range   Sodium 140 135 - 145 mEq/L   Potassium 3.7 3.5 - 5.1 mEq/L   Chloride 102 96 - 112 mEq/L   CO2 29 19 - 32 mEq/L   Glucose, Bld 95 70 - 99 mg/dL   BUN 14 6 - 23 mg/dL   Creatinine, Ser 7.82 0.40 - 1.20 mg/dL   Total Bilirubin 0.6 0.2 - 1.2 mg/dL   Alkaline Phosphatase 102 39 - 117 U/L   AST 18 0 - 37 U/L   ALT 28 0 - 35 U/L   Total Protein 6.6 6.0 - 8.3 g/dL   Albumin 4.4 3.5 - 5.2 g/dL   GFR 95.62 >13.08 mL/min   Calcium 9.9 8.4 - 10.5 mg/dL  Direct LDL  Result Value Ref Range   Direct LDL 84.0 mg/dL  Hemoglobin M5H  Result Value Ref Range   Hgb A1c  MFr Bld 6.0 4.6 - 6.5 %  HM DIABETES EYE EXAM  Result Value Ref Range   HM Diabetic Eye Exam No Retinopathy No Retinopathy      Assessment & Plan:    Routine Health Maintenance and Physical Exam  Immunization History  Administered Date(s) Administered   Fluad Quad(high Dose 65+) 12/12/2018, 10/07/2019   Influenza, Seasonal, Injecte, Preservative Fre 12/18/2012   Influenza,inj,Quad PF,6+ Mos 10/18/2011, 09/28/2017   Influenza,trivalent, recombinat, inj, PF 01/24/2010   Influenza-Unspecified 01/24/2010, 10/18/2011, 12/18/2012, 10/25/2015, 10/24/2016   PFIZER(Purple Top)SARS-COV-2 Vaccination 05/09/2019, 05/30/2019, 03/24/2020   PNEUMOCOCCAL CONJUGATE-20 09/02/2020   Pneumococcal Polysaccharide-23 05/05/2016   Tdap 01/25/2008   Zoster Recombinat (Shingrix) 08/04/2020, 09/02/2021    Health Maintenance  Topic Date Due   COLONOSCOPY (Pts 45-2yrs Insurance coverage will need to be confirmed)  02/21/2018   MAMMOGRAM  04/28/2021   COVID-19 Vaccine (4 - 2023-24 season) 02/24/2022 (Originally 09/24/2021)   INFLUENZA VACCINE  04/24/2022 (Originally 08/24/2021)   HIV Screening  02/09/2023 (Originally 11/30/1980)   FOOT EXAM  08/05/2022   HEMOGLOBIN A1C  08/09/2022   Diabetic kidney evaluation - Urine ACR  09/03/2022   OPHTHALMOLOGY EXAM  11/12/2022   Diabetic kidney evaluation - eGFR measurement  02/09/2023   Hepatitis C Screening  Completed   Zoster Vaccines- Shingrix  Completed  HPV VACCINES  Aged Out   DTaP/Tdap/Td  Discontinued   PAP SMEAR-Modifier  Discontinued    Discussed health benefits of physical activity, and encouraged her to engage in regular exercise appropriate for her age and condition.  Problem List Items Addressed This Visit       Cardiovascular and Mediastinum   Hot flashes    Declined to continue paxil      Relevant Medications   rosuvastatin (CRESTOR) 10 MG tablet     Endocrine   DM (diabetes mellitus) (HCC)    Fasting glucose 120s-130s No adverse  effects with ozempic Repeat hgbA1c      Relevant Medications   rosuvastatin (CRESTOR) 10 MG tablet   Other Relevant Orders   Hemoglobin A1c (Completed)   Hyperlipidemia associated with type 2 diabetes mellitus (HCC)    Repeat ldl: Elevated LDL with risk for cardiac and cerebrovascular disease at 9.4%: change atorvastatin to crestor 10mg . New rx sent      Relevant Medications   rosuvastatin (CRESTOR) 10 MG tablet   Other Relevant Orders   Direct LDL (Completed)     Nervous and Auditory   Multiple sclerosis ([place])    Current use of zeposia Under the care of Dr. Felecia Shelling      Other Visit Diagnoses     Encounter for preventative adult health care exam with abnormal findings    -  Primary   Relevant Orders   Comprehensive metabolic panel (Completed)   Colon cancer screening       Relevant Orders   Cologuard   Breast cancer screening by mammogram       Relevant Orders   MM 3D SCREEN BREAST BILATERAL      Return in about 6 months (around 08/09/2022) for HTN, DM, hyperlipidemia (fasting).     Wilfred Lacy, NP

## 2022-02-08 NOTE — Assessment & Plan Note (Signed)
Current use of zeposia Under the care of Dr. Felecia Shelling

## 2022-02-08 NOTE — Patient Instructions (Signed)
Call [phone] for cologuard kit. Schedule appt for dental cleaning Maintain appt for mammogram Go to lab  Preventive Care 57-57 Years Old, 57 Female Preventive care refers to lifestyle choices and visits with your health care provider that can promote health and wellness. Preventive care visits are also called wellness exams. What can I expect for my preventive care visit? Counseling Your health care provider may ask you questions about your: Medical history, including: Past medical problems. Family medical history. Pregnancy history. Current health, including: Menstrual cycle. Method of birth control. Emotional well-being. Home life and relationship well-being. Sexual activity and sexual health. Lifestyle, including: Alcohol, nicotine or tobacco, and drug use. Access to firearms. Diet, exercise, and sleep habits. Work and work Statistician. Sunscreen use. Safety issues such as seatbelt and bike helmet use. Physical exam Your health care provider will check your: Height and weight. These may be used to calculate your BMI (body mass index). BMI is a measurement that tells if you are at a healthy weight. Waist circumference. This measures the distance around your waistline. This measurement also tells if you are at a healthy weight and may help predict your risk of certain diseases, such as type 2 diabetes and high blood pressure. Heart rate and blood pressure. Body temperature. Skin for abnormal spots. What immunizations do I need?  Vaccines are usually given at various ages, according to a schedule. Your health care provider will recommend vaccines for you based on your age, medical history, and lifestyle or other factors, such as travel or where you work. What tests do I need? Screening Your health care provider may recommend screening tests for certain conditions. This may include: Lipid and cholesterol levels. Diabetes screening. This is done by checking your blood sugar  (glucose) after you have not eaten for a while (fasting). Pelvic exam and Pap test. Hepatitis B test. Hepatitis C test. HIV (human immunodeficiency virus) test. STI (sexually transmitted infection) testing, if you are at risk. Lung cancer screening. Colorectal cancer screening. Mammogram. Talk with your health care provider about when you should start having regular mammograms. This may depend on whether you have a family history of breast cancer. BRCA-related cancer screening. This may be done if you have a family history of breast, ovarian, tubal, or peritoneal cancers. Bone density scan. This is done to screen for osteoporosis. Talk with your health care provider about your test results, treatment options, and if necessary, the need for more tests. Follow these instructions at home: Eating and drinking  Eat a diet that includes fresh fruits and vegetables, whole grains, lean protein, and low-fat dairy products. Take vitamin and mineral supplements as recommended by your health care provider. Do not drink alcohol if: Your health care provider tells you not to drink. You are pregnant, may be pregnant, or are planning to become pregnant. If you drink alcohol: Limit how much you have to 0-1 drink a day. Know how much alcohol is in your drink. In the [place] one drink equals one 12 oz bottle of beer (355 mL), one 5 oz glass of wine (148 mL), or one 1 oz glass of hard liquor (44 mL). Lifestyle Brush your teeth every morning and night with fluoride toothpaste. Floss one time each day. Exercise for at least 30 minutes 5 or more days each week. Do not use any products that contain nicotine or tobacco. These products include cigarettes, chewing tobacco, and vaping devices, such as e-cigarettes. If you need help quitting, ask your health care provider.  Do not use drugs. If you are sexually active, practice safe sex. Use a condom or other form of protection to prevent STIs. If you do not wish to  become pregnant, use a form of birth control. If you plan to become pregnant, see your health care provider for a prepregnancy visit. Take aspirin only as told by your health care provider. Make sure that you understand how much to take and what form to take. Work with your health care provider to find out whether it is safe and beneficial for you to take aspirin daily. Find healthy ways to manage stress, such as: Meditation, yoga, or listening to music. Journaling. Talking to a trusted person. Spending time with friends and family. Minimize exposure to UV radiation to reduce your risk of skin cancer. Safety Always wear your seat belt while driving or riding in a vehicle. Do not drive: If you have been drinking alcohol. Do not ride with someone who has been drinking. When you are tired or distracted. While texting. If you have been using any mind-altering substances or drugs. Wear a helmet and other protective equipment during sports activities. If you have firearms in your house, make sure you follow all gun safety procedures. Seek help if you have been physically or sexually abused. What's next? Visit your health care provider once a year for an annual wellness visit. Ask your health care provider how often you should have your eyes and teeth checked. Stay up to date on all vaccines. This information is not intended to replace advice given to you by your health care provider. Make sure you discuss any questions you have with your health care provider. Document Revised: 07/08/2020 Document Reviewed: 07/08/2020 Elsevier Patient Education  [employer].

## 2022-02-08 NOTE — Assessment & Plan Note (Signed)
Declined to continue paxil

## 2022-02-08 NOTE — Assessment & Plan Note (Addendum)
Repeat ldl: Elevated LDL with risk for cardiac and cerebrovascular disease at 9.4%: change atorvastatin to crestor 10mg . New rx sent

## 2022-02-09 NOTE — Patient Instructions (Addendum)
Below is our plan:  We will continue Zeposia. I will update labs, today. I will send a referral to PT for the back pain. Continue Tylenol at home. May consider a muscle relaxer if needed.   Please make sure you are staying well hydrated. I recommend 50-60 ounces daily. Well balanced diet and regular exercise encouraged. Consistent sleep schedule with 6-8 hours recommended.   Please continue follow up with care team as directed.   Follow up with Dr Felecia Shelling in 6 months   You may receive a survey regarding today's visit. I encourage you to leave honest feed back as I do use this information to improve patient care. Thank you for seeing me today!

## 2022-02-09 NOTE — Addendum Note (Signed)
Addended by: Wilfred Lacy L on: 02/09/2022 09:02 AM   Modules accepted: Orders

## 2022-02-09 NOTE — Progress Notes (Signed)
PATIENT: Cathy [name] DOB: 05-Jul-1965  REASON FOR VISIT: follow up HISTORY FROM: patient  Chief Complaint  Patient presents with   Room 10    Pt is here Alone. Pt states that things have been ok and just her back is hurting her.      HISTORY OF PRESENT ILLNESS:  02/10/22 ALL: Cathy [name] is a 57 y.o. female here today for follow up RRMS. She continues Zeposia. Last MRI brain and cervical spine stable 03/2020.   She feels that she is doing well, overall. She feels MS is stable. [name] new weakness or numbness.   She is having more thoracic and low back pain. This seems to wax and wane but has been worse for her over the past two months. It is a dull achy pain. [name] radicular symptoms. She feels Tylenol helps most. She continues gabapentin 300mg  at bedtime for sleep but does not seem to help back pain. PT has helped in past.   Headaches are well managed. She [name] longer takes imipramine.   She continues dextroamphetamine 10mg  daily for fatigue and feels it helps. She does have off and on insomnia but overall feels that she is sleeping well. She reports getting about 4-5 hours of sleep most nights.   Mood is stable.  HISTORY: (copied from Dr Cathy [name] previous note)  Cathy [name] is a 57 y.o. woman with MS andgait issues.   Update 08/10/2021: She was on Gilenya and tolerates it well but switched to Zeposia due to high copay. .   She has [name] exacerbations recently.  Her last MRIs were stable.    MRI cervical  spine 04/15/2020 shows several older foci bit none new since prior MRI    [name] exacerbation pr new MS symptom.   Gait is about the same with a mild limp.   [name] falls but rare stumbles and se needs to be areful on stairs.    She notes mild right leg weakness.   She has some right leg tingling at times.      Bladder functon is fine.   Some constipation.    She notes [name] new vision issues and needs reading glasses.   Colors are dulled OD         Dextroamphetamine helps hr fatigue.    She has insomnia about 1/3 the nights.   Does not know if she snores    She notes reduced STM and attention/focus.   Also mild verbal processing issues.   She is on dextroamphetamine 10 mg 4 pills in the morning -  helps her attention and fatigue.     Mood is doing well for the most part.      She has lower back pain but [name] radicular/sciatica pain     She has lost 20 pounds with Ozempic for DM   MS History:   She was diagnosed with MS after presenting with optic neuritis x 2 in 2009.   She had an MRI and LP and was diagnosed with MS.     She started on Copaxone and switched to [place] in 2013.    She denies any further exacerbation since the optic neuritis but was told she had MS plaques on her spine when she presented last year with a cervical radiculopathy requiring surgery.    I have seen her the past year at [facility].   Her last MRI's were Jan 21, 2014.  MRI images show foci in the cerebellum and the hemispheres consistent  with MS. There were [name] acute findings. She has NIDDM, HTN and high cholesterol.   Imaging: MRI cervical spine 06/27/18 showed several T2 hyperintense foci within the spinal cord.  These are located posteriorly adjacent to C2-C3, laterally to the left adjacent to C2-C3, laterally to the right adjacent to C6 and posterolaterally to the right adjacent to C7.  The foci were present on the 01/29/2016 .  Also mild spinal stenosis at [facility] and C6C7.  She has C4-C6 fusion.   MRI Brain 06/27/18:   "There are T2/flair hyperintense foci in the hemispheres, left thalamus, left cerebellum and spinal cord in a pattern and configuration consistent with chronic demyelinating plaque associated with multiple sclerosis.  None of the foci are acute   MRI cervical spine 04/15/2020 showed patchy T2 hyperintensity within the cervical spine, discrete foci are located centrally adjacent to C2, posteriorly adjacent to C3, to the right adjacent to C6-C7 and to  the right adjacent to C7-T1.  There did not appear to be any new lesions compared to the MRI from 2020.   There is metal artifact consistent with C3-C6 ACDF, unchanged compared to the 2020 MRI  REVIEW OF SYSTEMS: Out of a complete 14 system review of symptoms, the patient complains only of the following symptoms, headaches, fatigue, weakness right lower extremity and all other reviewed systems are negative.  ALLERGIES: Allergies  Allergen Reactions   Aspirin     Heartburn   Ibuprofen     heatburn    HOME MEDICATIONS: Outpatient Medications Prior to Visit  Medication Sig Dispense Refill   atorvastatin (LIPITOR) 40 MG tablet TAKE 1 TABLET(40 MG) BY MOUTH DAILY AT 6 PM 90 tablet 3   Cholecalciferol (VITAMIN D) 2000 UNITS CAPS Take 2,000 Units by mouth daily.     dextroamphetamine (DEXEDRINE) 10 MG 24 hr capsule Take 2-4 capsules (20-40 mg total) by mouth daily. 120 capsule 0   diclofenac Sodium (VOLTAREN) 1 % GEL Apply topically.     gabapentin (NEURONTIN) 300 MG capsule Take 300 mg by mouth 3 (three) times daily.     loratadine (CLARITIN) 10 MG tablet Take 10 mg by mouth daily.     losartan-hydrochlorothiazide (HYZAAR) 50-12.5 MG tablet TAKE 1 TABLET BY MOUTH DAILY 90 tablet 1   Ozanimod HCl (ZEPOSIA) 0.92 MG CAPS Take 1 capsule (0.92 mg total) by mouth daily. 30 capsule 0   Semaglutide, 1 MG/DOSE, (OZEMPIC, 1 MG/DOSE,) 4 MG/3ML SOPN Inject 1 mg into the skin once a week. 9 mL 3   triamcinolone cream (KENALOG) 0.1 % Apply topically 3 (three) times daily. 45 g 1   [name] facility-administered medications prior to visit.    PAST MEDICAL HISTORY: Past Medical History:  Diagnosis Date   Diabetes mellitus without complication (HCC)    Eczema    Hypertension    Multiple sclerosis (HCC)    Vision abnormalities     PAST SURGICAL HISTORY: Past Surgical History:  Procedure Laterality Date   ABDOMINAL HYSTERECTOMY     CARPAL TUNNEL RELEASE     CERVICAL SPINE SURGERY     Dr. Sharolyn Douglas    LASER ABLATION     TENDON REPAIR Left    wrist surgery   TUBAL LIGATION      FAMILY HISTORY: Family History  Problem Relation Age of Onset   Hypertension Mother    Diabetes type II Mother    Stroke Mother    COPD Father    Multiple sclerosis Paternal Uncle    Multiple sclerosis  Paternal Grandfather     SOCIAL HISTORY: Social History   Socioeconomic History   Marital status: Married    Spouse name: Anothony   Number of children: 3   Years of education: 12+   Highest education level: Not on file  Occupational History   Not on file  Tobacco Use   Smoking status: Never   Smokeless tobacco: Never  Vaping Use   Vaping Use: Never used  Substance and Sexual Activity   Alcohol use: Yes    Alcohol/week: 1.0 standard drink of alcohol    Types: 1 Glasses of wine per week    Comment: occasionally   Drug use: [name]   Sexual activity: Yes    Birth control/protection: Surgical  Other Topics Concern   Not on file  Social History Narrative   Right handed    Caffeine VZS:MOLM sometimes.   Lives with husband,  Ethelene Browns   Social Determinants of Health   Financial Resource Strain: Not on file  Food Insecurity: Not on file  Transportation Needs: Not on file  Physical Activity: Not on file  Stress: Not on file  Social Connections: Not on file  Intimate Partner Violence: Not on file      PHYSICAL EXAM  Vitals:   02/10/22 1408  BP: 124/77  Pulse: (!) 101  Weight: 203 lb (92.1 kg)  Height: 5\' 5"  (1.651 m)    Body mass index is 33.78 kg/m.  Generalized: Well developed, in [name] acute distress  Cardiology: normal rate and rhythm, [name] murmur noted Respiratory: clear to auscultation bilaterally  Neurological examination  Mentation: Alert oriented to time, place, history taking. Follows all commands speech and language fluent Cranial nerve II-XII: Pupils were equal round reactive to light. Extraocular movements were full, visual field were full on confrontational test. Facial  sensation and strength were normal. Uvula tongue midline. Head turning and shoulder shrug  were normal and symmetric. Motor: The motor testing reveals 5 over 5 strength of all 4 extremities, with exception of right hip flexion. Good symmetric motor tone is noted throughout.  Sensory: Sensory testing is intact to soft touch on all 4 extremities. [name] evidence of extinction is noted.  Coordination: Cerebellar testing reveals good finger-nose-finger and mildly reduced right heel-to-shin.  Gait and station: Gait is mildly ataxic, slight foot drop of right foot.  Reflexes: Deep tendon reflexes are symmetric and normal bilaterally.   DIAGNOSTIC DATA (LABS, IMAGING, TESTING) - I reviewed patient records, labs, notes, testing and imaging myself where available.      [name] data to display           Lab Results  Component Value Date   WBC 5.3 08/10/2021   HGB 12.6 08/10/2021   HCT 37.8 08/10/2021   MCV 86 08/10/2021   PLT 408 08/10/2021      Component Value Date/Time   NA 140 02/08/2022 1420   NA 135 03/31/2020 1644   K 3.7 02/08/2022 1420   CL 102 02/08/2022 1420   CO2 29 02/08/2022 1420   GLUCOSE 95 02/08/2022 1420   BUN 14 02/08/2022 1420   BUN 8 03/31/2020 1644   CREATININE 0.75 02/08/2022 1420   CALCIUM 9.9 02/08/2022 1420   PROT 6.6 02/08/2022 1420   PROT 6.3 02/03/2021 1446   ALBUMIN 4.4 02/08/2022 1420   ALBUMIN 4.4 02/03/2021 1446   AST 18 02/08/2022 1420   ALT 28 02/08/2022 1420   ALKPHOS 102 02/08/2022 1420   BILITOT 0.6 02/08/2022 1420   BILITOT 0.3 02/03/2021  [place] 09/26/2019 1604   GFRAA 105 09/26/2019 1604   Lab Results  Component Value Date   CHOL 161 08/04/2021   HDL 52.90 08/04/2021   LDLCALC 88 08/04/2021   LDLDIRECT 84.0 02/08/2022   TRIG 97.0 08/04/2021   CHOLHDL 3 08/04/2021   Lab Results  Component Value Date   HGBA1C 6.0 02/08/2022   Lab Results  Component Value Date   VITAMINB12 363 08/27/2009   Lab Results  Component Value  Date   TSH 1.94 11/01/2021       ASSESSMENT AND PLAN 57 y.o. year old female  has a past medical history of Diabetes mellitus without complication ([place]), Eczema, Hypertension, Multiple sclerosis ([place]), and Vision abnormalities. here with     ICD-10-CM   1. Multiple sclerosis ([place])  G35 CBC with Differential/Platelets    Ambulatory referral to Physical Therapy    MR BRAIN W WO CONTRAST    MR CERVICAL SPINE W WO CONTRAST    2. High risk medication use  Z79.899     3. Attention deficit disorder, unspecified hyperactivity presence  F98.8     4. Other fatigue  R53.83     5. Chronic bilateral back pain, unspecified back location  M54.9 Ambulatory referral to Physical Therapy   G89.29 MR Lumbar Spine W Wo Contrast       Cathy [name] is doing well, today. She will continue current treatment plan. We will update labs and MRIs. Will assess lumbar spine for chronic pain. PT was helpful in [place]. I will send her again. Prefer neuro rehab if possible. We have discussed steroid taper but she declines at this time. Intolerant to Nsaids. She may continue Tylenol if helpful. I have encouraged healthy lifestyle habits. She will continue close follow up with PCP. Importance of diabetes management reviewed. She will follow up with [place] in 6 months, sooner if needed. She verbalizes understanding and agreement with this plan.    Orders Placed This Encounter  Procedures   MR BRAIN W WO CONTRAST    Standing Status:   Future    Standing Expiration Date:   02/11/2023    Order Specific Question:   If indicated for the ordered procedure, I authorize the administration of contrast media per Radiology protocol    Answer:   Yes    Order Specific Question:   What is the patient's sedation requirement?    Answer:   [name] Sedation    Order Specific Question:   Does the patient have a pacemaker or implanted devices?    Answer:   [name]    Order Specific Question:   Preferred imaging location?    Answer:   Internal   MR  CERVICAL SPINE W WO CONTRAST    Standing Status:   Future    Standing Expiration Date:   02/11/2023    Order Specific Question:   If indicated for the ordered procedure, I authorize the administration of contrast media per Radiology protocol    Answer:   Yes    Order Specific Question:   What is the patient's sedation requirement?    Answer:   [name] Sedation    Order Specific Question:   Does the patient have a pacemaker or implanted devices?    Answer:   [name]    Order Specific Question:   Preferred imaging location?    Answer:   Internal   MR Lumbar Spine W Wo Contrast    Standing Status:   Future  Standing Expiration Date:   02/11/2023    Order Specific Question:   If indicated for the ordered procedure, I authorize the administration of contrast media per Radiology protocol    Answer:   Yes    Order Specific Question:   What is the patient's sedation requirement?    Answer:   [name] Sedation    Order Specific Question:   Does the patient have a pacemaker or implanted devices?    Answer:   [name]    Order Specific Question:   Preferred imaging location?    Answer:   Internal   CBC with Differential/Platelets   Ambulatory referral to Physical Therapy    Referral Priority:   Routine    Referral Type:   Physical Medicine    Referral Reason:   Specialty Services Required    Requested Specialty:   Physical Therapy    Number of Visits Requested:   1     [name] orders of the defined types were placed in this encounter.     I spent 30 minutes with the patient. 50% of this time was spent counseling and educating patient on plan of care and medications.     Shawnie Dapper, FNP-C 02/10/2022, 4:10 PM [facility] [address] [place], [place] [address] [phone]

## 2022-02-10 ENCOUNTER — Encounter: Payer: Self-pay | Admitting: Nurse Practitioner

## 2022-02-10 ENCOUNTER — Ambulatory Visit: Payer: Federal, State, Local not specified - PPO | Admitting: Family Medicine

## 2022-02-10 ENCOUNTER — Encounter: Payer: Self-pay | Admitting: Family Medicine

## 2022-02-10 ENCOUNTER — Telehealth: Payer: Self-pay | Admitting: Family Medicine

## 2022-02-10 VITALS — BP 124/77 | HR 101 | Ht 65.0 in | Wt 203.0 lb

## 2022-02-10 DIAGNOSIS — G8929 Other chronic pain: Secondary | ICD-10-CM

## 2022-02-10 DIAGNOSIS — Z79899 Other long term (current) drug therapy: Secondary | ICD-10-CM

## 2022-02-10 DIAGNOSIS — F988 Other specified behavioral and emotional disorders with onset usually occurring in childhood and adolescence: Secondary | ICD-10-CM

## 2022-02-10 DIAGNOSIS — R5383 Other fatigue: Secondary | ICD-10-CM | POA: Diagnosis not present

## 2022-02-10 DIAGNOSIS — G35 Multiple sclerosis: Secondary | ICD-10-CM | POA: Diagnosis not present

## 2022-02-10 DIAGNOSIS — M549 Dorsalgia, unspecified: Secondary | ICD-10-CM

## 2022-02-10 NOTE — Telephone Encounter (Signed)
BCBS Federal NPR sent to GI [phone] 

## 2022-02-11 ENCOUNTER — Other Ambulatory Visit (HOSPITAL_COMMUNITY): Payer: Self-pay

## 2022-02-11 LAB — CBC WITH DIFFERENTIAL/PLATELET
Basophils Absolute: 0 10*3/uL (ref 0.0–0.2)
Basos: 1 %
EOS (ABSOLUTE): 0.1 10*3/uL (ref 0.0–0.4)
Eos: 2 %
Hematocrit: 40.3 % (ref 34.0–46.6)
Hemoglobin: 13.2 g/dL (ref 11.1–15.9)
Immature Grans (Abs): 0 10*3/uL (ref 0.0–0.1)
Immature Granulocytes: 0 %
Lymphocytes Absolute: 0.6 10*3/uL — ABNORMAL LOW (ref 0.7–3.1)
Lymphs: 17 %
MCH: 29 pg (ref 26.6–33.0)
MCHC: 32.8 g/dL (ref 31.5–35.7)
MCV: 89 fL (ref 79–97)
Monocytes Absolute: 0.4 10*3/uL (ref 0.1–0.9)
Monocytes: 13 %
Neutrophils Absolute: 2.2 10*3/uL (ref 1.4–7.0)
Neutrophils: 67 %
Platelets: 375 10*3/uL (ref 150–450)
RBC: 4.55 x10E6/uL (ref 3.77–5.28)
RDW: 13.1 % (ref 11.7–15.4)
WBC: 3.3 10*3/uL — ABNORMAL LOW (ref 3.4–10.8)

## 2022-02-12 MED ORDER — ROSUVASTATIN CALCIUM 10 MG PO TABS: 10.0000 mg | ORAL_TABLET | Freq: Every day | ORAL | 3 refills | Status: DC

## 2022-02-12 NOTE — Addendum Note (Signed)
Addended by: Leana Gamer on: 02/12/2022 03:46 PM   Modules accepted: Orders

## 2022-02-16 ENCOUNTER — Telehealth: Payer: Self-pay | Admitting: Pharmacy Technician

## 2022-02-16 NOTE — Telephone Encounter (Signed)
Patient Advocate Encounter   Received notification that prior authorization for Zeposia 0.92MG  capsules is required.   PA submitted on 02/16/2022 Key [id number] Status is pending       Lyndel Safe, [place] Patient Advocate Specialist [facility] Patient Advocate Team Direct Number: [phone]  Fax: [phone]

## 2022-02-17 ENCOUNTER — Encounter: Payer: Self-pay | Admitting: Nurse Practitioner

## 2022-02-17 ENCOUNTER — Other Ambulatory Visit: Payer: Self-pay

## 2022-02-17 DIAGNOSIS — Z1211 Encounter for screening for malignant neoplasm of colon: Secondary | ICD-10-CM

## 2022-02-18 ENCOUNTER — Other Ambulatory Visit (HOSPITAL_COMMUNITY): Payer: Self-pay

## 2022-02-18 NOTE — Telephone Encounter (Signed)
Pharmacy Patient Advocate Encounter  Prior Authorization for Ozempic has been submitted and approved.    PA# [id number] Effective dates: 01/19/2022 through 02/18/2023

## 2022-02-18 NOTE — Telephone Encounter (Signed)
Pharmacy Patient Advocate Encounter  Prior Authorization for Zeposia 0.92MG  capsules has been approved.    PA# None provided Effective dates: 01/17/2022 through 02/16/2023

## 2022-02-21 ENCOUNTER — Other Ambulatory Visit: Payer: Self-pay | Admitting: *Deleted

## 2022-02-21 DIAGNOSIS — G35 Multiple sclerosis: Secondary | ICD-10-CM

## 2022-02-21 MED ORDER — ZEPOSIA 0.92 MG PO CAPS: 0.9200 mg | ORAL_CAPSULE | Freq: Every day | ORAL | 11 refills | Status: DC

## 2022-02-27 ENCOUNTER — Ambulatory Visit
Admission: RE | Admit: 2022-02-27 | Discharge: 2022-02-27 | Disposition: A | Discharge status: Home / Self Care | Payer: Federal, State, Local not specified - PPO | Source: Ambulatory Visit | Attending: Family Medicine | Admitting: Family Medicine

## 2022-02-27 ENCOUNTER — Other Ambulatory Visit: Payer: Federal, State, Local not specified - PPO

## 2022-02-27 DIAGNOSIS — G35 Multiple sclerosis: Secondary | ICD-10-CM | POA: Diagnosis not present

## 2022-02-27 DIAGNOSIS — G8929 Other chronic pain: Secondary | ICD-10-CM

## 2022-02-27 DIAGNOSIS — M549 Dorsalgia, unspecified: Secondary | ICD-10-CM | POA: Diagnosis not present

## 2022-02-27 MED ORDER — GADOPICLENOL 0.5 MMOL/ML IV SOLN
9.0000 mL | Freq: Once | INTRAVENOUS | Status: AC | PRN
  Administered 2022-02-27: 9 mL via INTRAVENOUS

## 2022-03-01 ENCOUNTER — Encounter: Payer: Self-pay | Admitting: Physical Therapy

## 2022-03-01 ENCOUNTER — Ambulatory Visit: Payer: Federal, State, Local not specified - PPO | Attending: Family Medicine | Admitting: Physical Therapy

## 2022-03-01 DIAGNOSIS — M5459 Other low back pain: Secondary | ICD-10-CM | POA: Insufficient documentation

## 2022-03-01 DIAGNOSIS — G8929 Other chronic pain: Secondary | ICD-10-CM | POA: Diagnosis not present

## 2022-03-01 DIAGNOSIS — M549 Dorsalgia, unspecified: Secondary | ICD-10-CM | POA: Diagnosis not present

## 2022-03-01 DIAGNOSIS — R262 Difficulty in walking, not elsewhere classified: Secondary | ICD-10-CM | POA: Insufficient documentation

## 2022-03-01 DIAGNOSIS — G35 Multiple sclerosis: Secondary | ICD-10-CM | POA: Insufficient documentation

## 2022-03-01 DIAGNOSIS — M6281 Muscle weakness (generalized): Secondary | ICD-10-CM | POA: Insufficient documentation

## 2022-03-01 NOTE — Therapy (Signed)
January OUTPATIENT PHYSICAL THERAPY LOWER EXTREMITY EVALUATION   Patient Name: Cathy [name] MRN: [id number] DOB:[birth year], 57 y.o., female Today's Date: 03/01/2022  END OF SESSION:  PT End of Session - 03/01/22 1732     Visit Number 1    Number of Visits 50    Date for PT Re-Evaluation 05/30/22    Authorization Type BCBS    PT Start Time 1730    PT Stop Time 1817    PT Time Calculation (min) 47 min    Activity Tolerance Patient tolerated treatment well    Behavior During Therapy WFL for tasks assessed/performed             Past Medical History:  Diagnosis Date   Diabetes mellitus without complication ([place])    Eczema    Hypertension    Multiple sclerosis ([place])    Vision abnormalities    Past Surgical History:  Procedure Laterality Date   ABDOMINAL HYSTERECTOMY     CARPAL TUNNEL RELEASE     CERVICAL SPINE SURGERY     Dr. Rennis Harding   LASER ABLATION     TENDON REPAIR Left    wrist surgery   TUBAL LIGATION     Patient Active Problem List   Diagnosis Date Noted   Hot flashes 10/28/2021   Primary osteoarthritis of right knee 07/15/2018   Right knee pain 06/20/2018   Ganglion cyst of dorsum of left wrist 03/14/2017   Leukopenia 12/29/2016   Dysesthesia 01/06/2016   Left shoulder pain 10/06/2015   Gait disturbance 09/03/2015   High risk medication use 05/21/2014   Vitamin D deficiency 05/21/2014   Other fatigue 05/21/2014   Disturbed cognition 05/21/2014   Attention deficit disorder 05/21/2014   Spinal stenosis in cervical region 11/29/2013   Insomnia 08/22/2013   Herniated nucleus pulposus with myelopathy, cervical 08/02/2013   Cervical nerve root disorder 07/29/2013   Essential (primary) hypertension 05/27/2013   Hyperlipidemia associated with type 2 diabetes mellitus ([place]) 05/01/2013   DM (diabetes mellitus) ([place]) 05/01/2013   DS (disseminated sclerosis) ([place]) 04/30/2013   Solitary pulmonary nodule 04/30/2013   CHEST PAIN UNSPECIFIED  10/06/2009   ANEMIA-NOS 08/03/2009   Multiple sclerosis ([place]) 08/03/2009   Optic neuritis, right 08/03/2009   ALLERGIC RHINITIS, SEASONAL 08/03/2009   ASTHMA 08/03/2009   Hay fever 08/03/2009   History of disorder of central nervous system 08/03/2009   Mild intermittent asthma without complication 24/40/1027    PCP: Lorayne Marek, NP  REFERRING PROVIDER: Lomax, NP  REFERRING DIAG: MS, weakness  THERAPY DIAG:  Muscle weakness (generalized)  Other low back pain  Difficulty in walking, not elsewhere classified  Rationale for Evaluation and Treatment: Rehabilitation  ONSET DATE: January 2024  SUBJECTIVE:   SUBJECTIVE STATEMENT: Patient reports a dx of MS, she reports that she has noticed some right lower leg weakness and more difficulty walking, also some low back pain  PERTINENT HISTORY: See above PAIN:  Are you having pain? Yes: NPRS scale: 0/10 Pain location: low back Pain description: ache Aggravating factors: sitting  up to 6/10 Relieving factors: walk, tylenol  PRECAUTIONS: None  WEIGHT BEARING RESTRICTIONS: No  FALLS:  Has patient fallen in last 6 months? 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, cleans house  PATIENT GOALS: be stronger, walk better, less pain  NEXT MD VISIT: June  2024  OBJECTIVE:   COGNITION: Overall cognitive status: Within functional limits for tasks assessed     SENSATION: WFL  POSTURE: rounded shoulders and forward head  PALPATION: Tight in the low back, non tender  LOWER EXTREMITY ROM:  Lumbar ROM WNL's flexion, other motions decreased 25% with pain right side bending  Active ROM Right eval Left eval  Hip flexion 50 100  Hip extension    Hip abduction    Hip adduction    Hip internal rotation    Hip external  rotation    Knee flexion    Knee extension 12 0  Ankle dorsiflexion 5 12  Ankle plantarflexion    Ankle inversion    Ankle eversion     (Blank rows = not tested)  LOWER EXTREMITY MMT:  MMT Right eval Left eval  Hip flexion 3+ 4+  Hip extension    Hip abduction 3+ 4+  Hip adduction    Hip internal rotation    Hip external rotation    Knee flexion 4- 4+  Knee extension 3+ 4+  Ankle dorsiflexion 4- 4+  Ankle plantarflexion 4- 4+  Ankle inversion 1 4+  Ankle eversion 1 4+   (Blank rows = not tested)  FUNCTIONAL TESTS:  Timed up and go (TUG): 15 3 minute walk test: 550' Berg Balance Scale: 44/56  GAIT: Distance walked: 500' Assistive device utilized: None Level of assistance: Complete Independence Comments: right LE decreased coordination, limp on the right  TODAY'S TREATMENT:                                                                                                                              DATE:    PATIENT EDUCATION:  Education details: POC Person educated: Patient Education method: Explanation Education comprehension: verbalized understanding  HOME EXERCISE PROGRAM: TBD  ASSESSMENT:  CLINICAL IMPRESSION: Patient is a 57 y.o. female who was seen today for physical therapy evaluation and treatment for MS, right LE weakness, LBP.  Reports that over the past year she has been noticing the right leg becoming weaker and her having some difficulty walking.  BERG balance 44/56, 3 minute walk test 550', decreased control of the right LE   OBJECTIVE IMPAIRMENTS: Abnormal gait, cardiopulmonary status limiting activity, decreased activity tolerance, decreased balance, decreased coordination, decreased endurance, decreased mobility, difficulty walking, decreased ROM, decreased strength, increased muscle spasms, impaired flexibility, improper body mechanics, and pain.  REHAB POTENTIAL: Good  CLINICAL DECISION MAKING: Stable/uncomplicated  EVALUATION COMPLEXITY:  Low   GOALS: Goals reviewed with patient? Yes  SHORT TERM GOALS: Target date: 03/15/22 Independent with initial HEP Goal status: INITIAL  LONG TERM GOALS: Target date: 05/30/22  Independent with advanced HEP or gym/pool Goal status: INITIAL  2.  Increase Berg balance score to 49/56 Baseline: 44/56 Goal status: INITIAL  3.  Increase right hip flexion strength to 4/5 Baseline:  Goal status: INITIAL  4.  Walk 1/2 mile without rest Baseline: 550 feet  Goal status: INITIAL  PLAN:  PT FREQUENCY: 1-2x/week  PT DURATION: 12 weeks  PLANNED INTERVENTIONS: Therapeutic exercises, Therapeutic activity, Neuromuscular re-education, Balance training, Gait training, Patient/Family education, Self Care, Joint mobilization, Stair training, Electrical stimulation, and Manual therapy  PLAN FOR NEXT SESSION: start gym program, issue HEP   Sumner Boast, PT 03/01/2022, 5:33 PM

## 2022-03-02 ENCOUNTER — Other Ambulatory Visit: Payer: Self-pay | Admitting: Neurology

## 2022-03-02 MED ORDER — DEXTROAMPHETAMINE SULFATE ER 10 MG PO CP24: 20.0000 mg | ORAL_CAPSULE | Freq: Every day | ORAL | 0 refills | Status: DC

## 2022-03-03 ENCOUNTER — Ambulatory Visit
Admission: RE | Admit: 2022-03-03 | Discharge: 2022-03-03 | Disposition: A | Discharge status: Home / Self Care | Payer: Federal, State, Local not specified - PPO | Source: Ambulatory Visit | Attending: Family Medicine | Admitting: Family Medicine

## 2022-03-03 DIAGNOSIS — G35 Multiple sclerosis: Secondary | ICD-10-CM

## 2022-03-03 MED ORDER — GADOPICLENOL 0.5 MMOL/ML IV SOLN
10.0000 mL | Freq: Once | INTRAVENOUS | Status: AC | PRN
  Administered 2022-03-03: 10 mL via INTRAVENOUS

## 2022-03-04 ENCOUNTER — Ambulatory Visit
Admission: RE | Admit: 2022-03-04 | Discharge: 2022-03-04 | Disposition: A | Discharge status: Home / Self Care | Payer: Federal, State, Local not specified - PPO | Source: Ambulatory Visit | Attending: Nurse Practitioner | Admitting: Nurse Practitioner

## 2022-03-04 DIAGNOSIS — Z1231 Encounter for screening mammogram for malignant neoplasm of breast: Secondary | ICD-10-CM | POA: Diagnosis not present

## 2022-03-08 NOTE — Progress Notes (Signed)
Stable Follow instructions as discussed during office visit.

## 2022-03-16 ENCOUNTER — Ambulatory Visit: Payer: Federal, State, Local not specified - PPO | Admitting: Physical Therapy

## 2022-03-16 LAB — COLOGUARD: COLOGUARD: NEGATIVE

## 2022-03-17 ENCOUNTER — Encounter: Payer: Self-pay | Admitting: Neurology

## 2022-03-23 ENCOUNTER — Encounter: Payer: Self-pay | Admitting: Physical Therapy

## 2022-03-23 ENCOUNTER — Ambulatory Visit: Payer: Federal, State, Local not specified - PPO | Admitting: Physical Therapy

## 2022-03-23 DIAGNOSIS — G35 Multiple sclerosis: Secondary | ICD-10-CM | POA: Diagnosis not present

## 2022-03-23 DIAGNOSIS — M6281 Muscle weakness (generalized): Secondary | ICD-10-CM

## 2022-03-23 DIAGNOSIS — R262 Difficulty in walking, not elsewhere classified: Secondary | ICD-10-CM

## 2022-03-23 DIAGNOSIS — M5459 Other low back pain: Secondary | ICD-10-CM

## 2022-03-23 DIAGNOSIS — M549 Dorsalgia, unspecified: Secondary | ICD-10-CM | POA: Diagnosis not present

## 2022-03-23 DIAGNOSIS — G8929 Other chronic pain: Secondary | ICD-10-CM | POA: Diagnosis not present

## 2022-03-23 NOTE — Therapy (Signed)
January OUTPATIENT PHYSICAL THERAPY LOWER EXTREMITY EVALUATION   Patient Name: Cathy [name] MRN: [id number] DOB:December 24, 1965, 57 y.o., female Today's Date: 03/23/2022  END OF SESSION:  PT End of Session - 03/23/22 1717     Visit Number 2    Number of Visits 50    Date for PT Re-Evaluation 05/30/22    Authorization Type BCBS    PT Start Time 1713    PT Stop Time 1755    PT Time Calculation (min) 42 min    Activity Tolerance Patient tolerated treatment well    Behavior During Therapy WFL for tasks assessed/performed             Past Medical History:  Diagnosis Date   Diabetes mellitus without complication ([place])    Eczema    Hypertension    Multiple sclerosis ([place])    Vision abnormalities    Past Surgical History:  Procedure Laterality Date   ABDOMINAL HYSTERECTOMY     CARPAL TUNNEL RELEASE     CERVICAL SPINE SURGERY     Dr. Rennis Harding   LASER ABLATION     TENDON REPAIR Left    wrist surgery   TUBAL LIGATION     Patient Active Problem List   Diagnosis Date Noted   Hot flashes 10/28/2021   Primary osteoarthritis of right knee 07/15/2018   Right knee pain 06/20/2018   Ganglion cyst of dorsum of left wrist 03/14/2017   Leukopenia 12/29/2016   Dysesthesia 01/06/2016   Left shoulder pain 10/06/2015   Gait disturbance 09/03/2015   High risk medication use 05/21/2014   Vitamin D deficiency 05/21/2014   Other fatigue 05/21/2014   Disturbed cognition 05/21/2014   Attention deficit disorder 05/21/2014   Spinal stenosis in cervical region 11/29/2013   Insomnia 08/22/2013   Herniated nucleus pulposus with myelopathy, cervical 08/02/2013   Cervical nerve root disorder 07/29/2013   Essential (primary) hypertension 05/27/2013   Hyperlipidemia associated with type 2 diabetes mellitus ([place]) 05/01/2013   DM (diabetes mellitus) ([place]) 05/01/2013   DS (disseminated sclerosis) ([place]) 04/30/2013   Solitary pulmonary nodule 04/30/2013   CHEST PAIN UNSPECIFIED  10/06/2009   ANEMIA-NOS 08/03/2009   Multiple sclerosis ([place]) 08/03/2009   Optic neuritis, right 08/03/2009   ALLERGIC RHINITIS, SEASONAL 08/03/2009   ASTHMA 08/03/2009   Hay fever 08/03/2009   History of disorder of central nervous system 08/03/2009   Mild intermittent asthma without complication [id number]    PCP: Lorayne Marek, NP  REFERRING PROVIDER: Lomax, NP  REFERRING DIAG: MS, weakness  THERAPY DIAG:  Muscle weakness (generalized)  Other low back pain  Difficulty in walking, not elsewhere classified  Rationale for Evaluation and Treatment: Rehabilitation  ONSET DATE: January 2024  SUBJECTIVE:   SUBJECTIVE STATEMENT: Reports no pain, reports no falls  PERTINENT HISTORY: See above PAIN:  Are you having pain? Yes: NPRS scale: 0/10 Pain location: low back Pain description: ache Aggravating factors: sitting  up to 6/10 Relieving factors: walk, tylenol  PRECAUTIONS: None  WEIGHT BEARING RESTRICTIONS: No  FALLS:  Has patient fallen in last 6 months? 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, cleans house  PATIENT GOALS: be stronger, walk better, less pain  NEXT MD VISIT: June 2024  OBJECTIVE:   COGNITION: Overall cognitive status: Within functional limits for tasks assessed     SENSATION:  WFL  POSTURE: rounded shoulders and forward head  PALPATION: Tight in the low back, non tender  LOWER EXTREMITY ROM:  Lumbar ROM WNL's flexion, other motions decreased 25% with pain right side bending  Active ROM Right eval Left eval  Hip flexion 50 100  Hip extension    Hip abduction    Hip adduction    Hip internal rotation    Hip external rotation    Knee flexion    Knee extension 12 0  Ankle dorsiflexion 5 12  Ankle plantarflexion    Ankle  inversion    Ankle eversion     (Blank rows = not tested)  LOWER EXTREMITY MMT:  MMT Right eval Left eval  Hip flexion 3+ 4+  Hip extension    Hip abduction 3+ 4+  Hip adduction    Hip internal rotation    Hip external rotation    Knee flexion 4- 4+  Knee extension 3+ 4+  Ankle dorsiflexion 4- 4+  Ankle plantarflexion 4- 4+  Ankle inversion 1 4+  Ankle eversion 1 4+   (Blank rows = not tested)  FUNCTIONAL TESTS:  Timed up and go (TUG): 15 3 minute walk test: 550' Berg Balance Scale: 44/56  GAIT: Distance walked: 500' Assistive device utilized: None Level of assistance: Complete Independence Comments: right LE decreased coordination, limp on the right  TODAY'S TREATMENT:                                                                                                                              DATE:  03/23/22 Nustep level 4 x 5 minutes 5# straight arm pulls 2x10 Side stepping on the airex balance beam, Tandem walk on airex balance beam Cone toe touch on and off airex Side step on and off airex On airex ball toss, head turns and eyes closed LEg curls 15# x10, right only 10# x10 Leg extension 5# x10, then right only with 2# and then 3# x 10 each Feet on ball K2C, bridges  PATIENT EDUCATION:  Education details: POC Person educated: Patient Education method: Explanation Education comprehension: verbalized understanding  HOME EXERCISE PROGRAM: TBD  ASSESSMENT:  CLINICAL IMPRESSION: Patient did very wel, no c/o pain, had difficulty with balance, requiring CGA for this.  The right leg and the right ankle are very weak, the ankle tends to roll over into supination  OBJECTIVE IMPAIRMENTS: Abnormal gait, cardiopulmonary status limiting activity, decreased activity tolerance, decreased balance, decreased coordination, decreased endurance, decreased mobility, difficulty walking, decreased ROM, decreased strength, increased muscle spasms, impaired flexibility, improper  body mechanics, and pain.  REHAB POTENTIAL: Good  CLINICAL DECISION MAKING: Stable/uncomplicated  EVALUATION COMPLEXITY: Low   GOALS: Goals reviewed with patient? Yes  SHORT TERM GOALS: Target date: 03/15/22 Independent with initial HEP Goal status: ongoing  LONG TERM GOALS: Target date: 05/30/22  Independent with advanced HEP or gym/pool Goal status: INITIAL  2.  Increase Berg balance score to 49/56 Baseline: 44/56 Goal  status: INITIAL  3.  Increase right hip flexion strength to 4/5 Baseline:  Goal status: INITIAL  4.  Walk 1/2 mile without rest Baseline: 550 feet Goal status: INITIAL  PLAN:  PT FREQUENCY: 1-2x/week  PT DURATION: 12 weeks  PLANNED INTERVENTIONS: Therapeutic exercises, Therapeutic activity, Neuromuscular re-education, Balance training, Gait training, Patient/Family education, Self Care, Joint mobilization, Stair training, Electrical stimulation, and Manual therapy  PLAN FOR NEXT SESSION: see how she did after our treatment issue HEP   Hyland Mollenkopf W, PT 03/23/2022, 5:18 PM

## 2022-03-24 ENCOUNTER — Other Ambulatory Visit: Payer: Self-pay | Admitting: Nurse Practitioner

## 2022-03-24 DIAGNOSIS — I10 Essential (primary) hypertension: Secondary | ICD-10-CM

## 2022-03-28 ENCOUNTER — Encounter: Payer: Self-pay | Admitting: Physical Therapy

## 2022-03-28 ENCOUNTER — Ambulatory Visit: Payer: Federal, State, Local not specified - PPO | Attending: Family Medicine | Admitting: Physical Therapy

## 2022-03-28 DIAGNOSIS — R262 Difficulty in walking, not elsewhere classified: Secondary | ICD-10-CM

## 2022-03-28 DIAGNOSIS — M6281 Muscle weakness (generalized): Secondary | ICD-10-CM | POA: Diagnosis not present

## 2022-03-28 DIAGNOSIS — M5459 Other low back pain: Secondary | ICD-10-CM

## 2022-03-28 NOTE — Therapy (Signed)
January OUTPATIENT PHYSICAL THERAPY LOWER EXTREMITY EVALUATION   Patient Name: Cathy [name] MRN: [id number] DOB:January 23, 1966, 57 y.o., female Today's Date: 03/28/2022  END OF SESSION:  PT End of Session - 03/28/22 1705     Visit Number 3    Number of Visits 50    Date for PT Re-Evaluation 05/30/22    Authorization Type BCBS    PT Start Time 1700    PT Stop Time 1745    PT Time Calculation (min) 45 min    Activity Tolerance Patient tolerated treatment well    Behavior During Therapy WFL for tasks assessed/performed             Past Medical History:  Diagnosis Date   Diabetes mellitus without complication ([place])    Eczema    Hypertension    Multiple sclerosis ([place])    Vision abnormalities    Past Surgical History:  Procedure Laterality Date   ABDOMINAL HYSTERECTOMY     CARPAL TUNNEL RELEASE     CERVICAL SPINE SURGERY     Dr. Rennis Harding   LASER ABLATION     TENDON REPAIR Left    wrist surgery   TUBAL LIGATION     Patient Active Problem List   Diagnosis Date Noted   Hot flashes 10/28/2021   Primary osteoarthritis of right knee 07/15/2018   Right knee pain 06/20/2018   Ganglion cyst of dorsum of left wrist 03/14/2017   Leukopenia 12/29/2016   Dysesthesia 01/06/2016   Left shoulder pain 10/06/2015   Gait disturbance 09/03/2015   High risk medication use 05/21/2014   Vitamin D deficiency 05/21/2014   Other fatigue 05/21/2014   Disturbed cognition 05/21/2014   Attention deficit disorder 05/21/2014   Spinal stenosis in cervical region 11/29/2013   Insomnia 08/22/2013   Herniated nucleus pulposus with myelopathy, cervical 08/02/2013   Cervical nerve root disorder 07/29/2013   Essential (primary) hypertension 05/27/2013   Hyperlipidemia associated with type 2 diabetes mellitus ([place]) 05/01/2013   DM (diabetes mellitus) ([place]) 05/01/2013   DS (disseminated sclerosis) ([place]) 04/30/2013   Solitary pulmonary nodule 04/30/2013   CHEST PAIN UNSPECIFIED  10/06/2009   ANEMIA-NOS 08/03/2009   Multiple sclerosis ([place]) 08/03/2009   Optic neuritis, right 08/03/2009   ALLERGIC RHINITIS, SEASONAL 08/03/2009   ASTHMA 08/03/2009   Hay fever 08/03/2009   History of disorder of central nervous system 08/03/2009   Mild intermittent asthma without complication [id number]    PCP: Lorayne Marek, NP  REFERRING PROVIDER: Lomax, NP  REFERRING DIAG: MS, weakness  THERAPY DIAG:  Muscle weakness (generalized)  Other low back pain  Difficulty in walking, not elsewhere classified  Rationale for Evaluation and Treatment: Rehabilitation  ONSET DATE: January 2024  SUBJECTIVE:   SUBJECTIVE STATEMENT: Reports that she was tired after the last treatment, not sore  PERTINENT HISTORY: See above PAIN:  Are you having pain? Yes: NPRS scale: 0/10 Pain location: low back Pain description: ache Aggravating factors: sitting  up to 6/10 Relieving factors: walk, tylenol  PRECAUTIONS: None  WEIGHT BEARING RESTRICTIONS: No  FALLS:  Has patient fallen in last 6 months? 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, cleans house  PATIENT GOALS: be stronger, walk better, less pain  NEXT MD VISIT: June 2024  OBJECTIVE:   COGNITION: Overall cognitive status: Within functional limits for tasks assessed  SENSATION: WFL  POSTURE: rounded shoulders and forward head  PALPATION: Tight in the low back, non tender  LOWER EXTREMITY ROM:  Lumbar ROM WNL's flexion, other motions decreased 25% with pain right side bending  Active ROM Right eval Left eval  Hip flexion 50 100  Hip extension    Hip abduction    Hip adduction    Hip internal rotation    Hip external rotation    Knee flexion    Knee extension 12 0  Ankle dorsiflexion 5 12  Ankle  plantarflexion    Ankle inversion    Ankle eversion     (Blank rows = not tested)  LOWER EXTREMITY MMT:  MMT Right eval Left eval  Hip flexion 3+ 4+  Hip extension    Hip abduction 3+ 4+  Hip adduction    Hip internal rotation    Hip external rotation    Knee flexion 4- 4+  Knee extension 3+ 4+  Ankle dorsiflexion 4- 4+  Ankle plantarflexion 4- 4+  Ankle inversion 1 4+  Ankle eversion 1 4+   (Blank rows = not tested)  FUNCTIONAL TESTS:  Timed up and go (TUG): 15 3 minute walk test: 550' Berg Balance Scale: 44/56  GAIT: Distance walked: 500' Assistive device utilized: None Level of assistance: Complete Independence Comments: right LE decreased coordination, limp on the right  TODAY'S TREATMENT:                                                                                                                              DATE:  03/28/22 Nustep level 5 x 6 minutes 30# resisted gait all directions 5# straight arm pulls on airex Volleyball on airex Leg press 20# 2x10, 20# 5 x each leg 15# lats 2x10 Walking ball toss Walking direction changes 25# leg curls  both, right only 15# Feet on ball K2C, trunk rotation, small bridge and isometric abs  03/23/22 Nustep level 4 x 5 minutes 5# straight arm pulls 2x10 Side stepping on the airex balance beam, Tandem walk on airex balance beam Cone toe touch on and off airex Side step on and off airex On airex ball toss, head turns and eyes closed LEg curls 15# x10, right only 10# x10 Leg extension 5# x10, then right only with 2# and then 3# x 10 each Feet on ball K2C, bridges  PATIENT EDUCATION:  Education details: POC Person educated: Patient Education method: Explanation Education comprehension: verbalized understanding  HOME EXERCISE PROGRAM: TBD  ASSESSMENT:  CLINICAL IMPRESSION: Patient did very wel, no c/o pain, had some difficulty with core activation, really needed a lot of verbal and tactile cues to activate the  core.  Less difficulty with balance today but still needs SBA  OBJECTIVE IMPAIRMENTS: Abnormal gait, cardiopulmonary status limiting activity, decreased activity tolerance, decreased balance, decreased coordination, decreased endurance, decreased mobility, difficulty walking, decreased ROM, decreased strength, increased muscle spasms, impaired flexibility, improper body mechanics, and pain.  REHAB  POTENTIAL: Good  CLINICAL DECISION MAKING: Stable/uncomplicated  EVALUATION COMPLEXITY: Low   GOALS: Goals reviewed with patient? Yes  SHORT TERM GOALS: Target date: 03/15/22 Independent with initial HEP Goal status: met  LONG TERM GOALS: Target date: 05/30/22  Independent with advanced HEP or gym/pool Goal status: INITIAL  2.  Increase Berg balance score to 49/56 Baseline: 44/56 Goal status: INITIAL  3.  Increase right hip flexion strength to 4/5 Baseline:  Goal status: INITIAL  4.  Walk 1/2 mile without rest Baseline: 550 feet Goal status: INITIAL  PLAN:  PT FREQUENCY: 1-2x/week  PT DURATION: 12 weeks  PLANNED INTERVENTIONS: Therapeutic exercises, Therapeutic activity, Neuromuscular re-education, Balance training, Gait training, Patient/Family education, Self Care, Joint mobilization, Stair training, Electrical stimulation, and Manual therapy  PLAN FOR NEXT SESSION: continue to progress exercises for strength and balance Jayelle Page W, PT 03/28/2022, 5:06 PM

## 2022-03-30 ENCOUNTER — Other Ambulatory Visit: Payer: Self-pay | Admitting: Neurology

## 2022-03-30 MED ORDER — DEXTROAMPHETAMINE SULFATE ER 10 MG PO CP24: 20.0000 mg | ORAL_CAPSULE | Freq: Every day | ORAL | 0 refills | Status: DC

## 2022-03-30 NOTE — Telephone Encounter (Signed)
Pt last seen on 08/10/21 Follow up scheduled on 07/25/22 Rx last filled on 03/03/22 # 120 (30 day supply) Rx pending to be signed

## 2022-04-06 ENCOUNTER — Ambulatory Visit: Payer: Federal, State, Local not specified - PPO | Admitting: Physical Therapy

## 2022-04-06 ENCOUNTER — Encounter: Payer: Self-pay | Admitting: Physical Therapy

## 2022-04-06 DIAGNOSIS — R262 Difficulty in walking, not elsewhere classified: Secondary | ICD-10-CM

## 2022-04-06 DIAGNOSIS — M5459 Other low back pain: Secondary | ICD-10-CM | POA: Diagnosis not present

## 2022-04-06 DIAGNOSIS — M6281 Muscle weakness (generalized): Secondary | ICD-10-CM

## 2022-04-06 NOTE — Therapy (Signed)
OUTPATIENT PHYSICAL THERAPY LOWER EXTREMITY TREATMENT   Patient Name: Cathy [name] MRN: [id number] DOB:[birth year], 57 y.o., female Today's Date: 04/06/2022  END OF SESSION:  PT End of Session - 04/06/22 1710     Visit Number 4    Number of Visits 50    Date for PT Re-Evaluation 05/30/22    Authorization Type BCBS    PT Start Time 1710    PT Stop Time 1750    PT Time Calculation (min) 40 min    Activity Tolerance Patient tolerated treatment well    Behavior During Therapy WFL for tasks assessed/performed             Past Medical History:  Diagnosis Date   Diabetes mellitus without complication ([place])    Eczema    Hypertension    Multiple sclerosis ([place])    Vision abnormalities    Past Surgical History:  Procedure Laterality Date   ABDOMINAL HYSTERECTOMY     CARPAL TUNNEL RELEASE     CERVICAL SPINE SURGERY     Dr. Rennis Harding   LASER ABLATION     TENDON REPAIR Left    wrist surgery   TUBAL LIGATION     Patient Active Problem List   Diagnosis Date Noted   Hot flashes 10/28/2021   Primary osteoarthritis of right knee 07/15/2018   Right knee pain 06/20/2018   Ganglion cyst of dorsum of left wrist 03/14/2017   Leukopenia 12/29/2016   Dysesthesia 01/06/2016   Left shoulder pain 10/06/2015   Gait disturbance 09/03/2015   High risk medication use 05/21/2014   Vitamin D deficiency 05/21/2014   Other fatigue 05/21/2014   Disturbed cognition 05/21/2014   Attention deficit disorder 05/21/2014   Spinal stenosis in cervical region 11/29/2013   Insomnia 08/22/2013   Herniated nucleus pulposus with myelopathy, cervical 08/02/2013   Cervical nerve root disorder 07/29/2013   Essential (primary) hypertension 05/27/2013   Hyperlipidemia associated with type 2 diabetes mellitus ([place]) 05/01/2013   DM (diabetes mellitus) ([place]) 05/01/2013   DS (disseminated sclerosis) ([place]) 04/30/2013   Solitary pulmonary nodule 04/30/2013   CHEST PAIN UNSPECIFIED 10/06/2009    ANEMIA-NOS 08/03/2009   Multiple sclerosis ([place]) 08/03/2009   Optic neuritis, right 08/03/2009   ALLERGIC RHINITIS, SEASONAL 08/03/2009   ASTHMA 08/03/2009   Hay fever 08/03/2009   History of disorder of central nervous system 08/03/2009   Mild intermittent asthma without complication [id number]    PCP: Lorayne Marek, NP  REFERRING PROVIDER: Lomax, NP  REFERRING DIAG: MS, weakness  THERAPY DIAG:  Muscle weakness (generalized)  Other low back pain  Difficulty in walking, not elsewhere classified  Rationale for Evaluation and Treatment: Rehabilitation  ONSET DATE: January 2024  SUBJECTIVE:   SUBJECTIVE STATEMENT: Doing pretty good, I am tired at the end but not too sore  PERTINENT HISTORY: See above PAIN:  Are you having pain? Yes: NPRS scale: 0/10 Pain location: low back Pain description: ache Aggravating factors: sitting  up to 6/10 Relieving factors: walk, tylenol  PRECAUTIONS: None  WEIGHT BEARING RESTRICTIONS: No  FALLS:  Has patient fallen in last 6 months? 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, cleans house  PATIENT GOALS: be stronger, walk better, less pain  NEXT MD VISIT: June 2024  OBJECTIVE:   COGNITION: Overall cognitive status: Within functional limits for tasks  assessed     SENSATION: WFL  POSTURE: rounded shoulders and forward head  PALPATION: Tight in the low back, non tender  LOWER EXTREMITY ROM:  Lumbar ROM WNL's flexion, other motions decreased 25% with pain right side bending  Active ROM Right eval Left eval  Hip flexion 50 100  Hip extension    Hip abduction    Hip adduction    Hip internal rotation    Hip external rotation    Knee flexion    Knee extension 12 0  Ankle dorsiflexion 5 12  Ankle plantarflexion     Ankle inversion    Ankle eversion     (Blank rows = not tested)  LOWER EXTREMITY MMT:  MMT Right eval Left eval  Hip flexion 3+ 4+  Hip extension    Hip abduction 3+ 4+  Hip adduction    Hip internal rotation    Hip external rotation    Knee flexion 4- 4+  Knee extension 3+ 4+  Ankle dorsiflexion 4- 4+  Ankle plantarflexion 4- 4+  Ankle inversion 1 4+  Ankle eversion 1 4+   (Blank rows = not tested)  FUNCTIONAL TESTS:  Timed up and go (TUG): 15 3 minute walk test: 550' Berg Balance Scale: 44/56  GAIT: Distance walked: 500' Assistive device utilized: None Level of assistance: Complete Independence Comments: right LE decreased coordination, limp on the right  TODAY'S TREATMENT:                                                                                                                              DATE:  04/06/22 Nustep level 5 x 5 minutes 10# straight arm pulls Leg press 40# both then 20# single legs REsisted gait all directions 40# Yellow tband PF/DF right ankle PROM right ankle, prior to this she had no active inversion or eversion Did some AROM inversion and eversion Volley ball on and off airex Airex marching Airex balance beam side stepping and tandem walking  03/28/22 Nustep level 5 x 6 minutes 30# resisted gait all directions 5# straight arm pulls on airex Volleyball on airex Leg press 20# 2x10, 20# 5 x each leg 15# lats 2x10 Walking ball toss Walking direction changes 25# leg curls  both, right only 15# Feet on ball K2C, trunk rotation, small bridge and isometric abs  03/23/22 Nustep level 4 x 5 minutes 5# straight arm pulls 2x10 Side stepping on the airex balance beam, Tandem walk on airex balance beam Cone toe touch on and off airex Side step on and off airex On airex ball toss, head turns and eyes closed LEg curls 15# x10, right only 10# x10 Leg extension 5# x10, then right only with 2# and then 3# x 10 each Feet on ball K2C,  bridges  PATIENT EDUCATION:  Education details: POC Person educated: Patient Education method: Explanation Education comprehension: verbalized understanding  HOME EXERCISE PROGRAM: TBD  ASSESSMENT:  CLINICAL IMPRESSION: Patient doing well, no  pain or soreness with after PT, she does have the right ankle that is very weak noted by it rolling laterally on uneven surfaces, I tried to get her to do some inversion and eversion and she could not actively do this, after some passive stretching and motions she was able to do this, asked her to do this at home  OBJECTIVE IMPAIRMENTS: Abnormal gait, cardiopulmonary status limiting activity, decreased activity tolerance, decreased balance, decreased coordination, decreased endurance, decreased mobility, difficulty walking, decreased ROM, decreased strength, increased muscle spasms, impaired flexibility, improper body mechanics, and pain.  REHAB POTENTIAL: Good  CLINICAL DECISION MAKING: Stable/uncomplicated  EVALUATION COMPLEXITY: Low   GOALS: Goals reviewed with patient? Yes  SHORT TERM GOALS: Target date: 03/15/22 Independent with initial HEP Goal status: met  LONG TERM GOALS: Target date: 05/30/22  Independent with advanced HEP or gym/pool Goal status: ongoing  2.  Increase Berg balance score to 49/56 Baseline: 44/56 Goal status: INITIAL  3.  Increase right hip flexion strength to 4/5 Baseline:  Goal status: ongoing  4.  Walk 1/2 mile without rest Baseline: 550 feet Goal status:ongoing  PLAN:  PT FREQUENCY: 1-2x/week  PT DURATION: 12 weeks  PLANNED INTERVENTIONS: Therapeutic exercises, Therapeutic activity, Neuromuscular re-education, Balance training, Gait training, Patient/Family education, Self Care, Joint mobilization, Stair training, Electrical stimulation, and Manual therapy  PLAN FOR NEXT SESSION: continue to progress exercises for strength and balance Edward Guthmiller W, PT 04/06/2022, 5:10 PM

## 2022-04-19 ENCOUNTER — Ambulatory Visit: Payer: Federal, State, Local not specified - PPO

## 2022-04-19 DIAGNOSIS — R262 Difficulty in walking, not elsewhere classified: Secondary | ICD-10-CM

## 2022-04-19 DIAGNOSIS — M5459 Other low back pain: Secondary | ICD-10-CM | POA: Diagnosis not present

## 2022-04-19 DIAGNOSIS — M6281 Muscle weakness (generalized): Secondary | ICD-10-CM

## 2022-04-19 NOTE — Therapy (Signed)
OUTPATIENT PHYSICAL THERAPY LOWER EXTREMITY TREATMENT   Patient Name: Cathy [name] MRN: [id number] DOB:1965-12-26, 57 y.o., female Today's Date: 04/19/2022  END OF SESSION:  PT End of Session - 04/19/22 1805     Visit Number 5    Number of Visits 50    Date for PT Re-Evaluation 05/30/22    Authorization Type [employer]    PT Start Time 1805    PT Stop Time 1845    PT Time Calculation (min) 40 min    Activity Tolerance Patient tolerated treatment well    Behavior During Therapy WFL for tasks assessed/performed              Past Medical History:  Diagnosis Date   Diabetes mellitus without complication ([place])    Eczema    Hypertension    Multiple sclerosis ([place])    Vision abnormalities    Past Surgical History:  Procedure Laterality Date   ABDOMINAL HYSTERECTOMY     CARPAL TUNNEL RELEASE     CERVICAL SPINE SURGERY     Dr. Rennis Harding   LASER ABLATION     TENDON REPAIR Left    wrist surgery   TUBAL LIGATION     Patient Active Problem List   Diagnosis Date Noted   Hot flashes 10/28/2021   Primary osteoarthritis of right knee 07/15/2018   Right knee pain 06/20/2018   Ganglion cyst of dorsum of left wrist 03/14/2017   Leukopenia 12/29/2016   Dysesthesia 01/06/2016   Left shoulder pain 10/06/2015   Gait disturbance 09/03/2015   High risk medication use 05/21/2014   Vitamin D deficiency 05/21/2014   Other fatigue 05/21/2014   Disturbed cognition 05/21/2014   Attention deficit disorder 05/21/2014   Spinal stenosis in cervical region 11/29/2013   Insomnia 08/22/2013   Herniated nucleus pulposus with myelopathy, cervical 08/02/2013   Cervical nerve root disorder 07/29/2013   Essential (primary) hypertension 05/27/2013   Hyperlipidemia associated with type 2 diabetes mellitus ([place]) 05/01/2013   DM (diabetes mellitus) ([place]) 05/01/2013   DS (disseminated sclerosis) ([place]) 04/30/2013   Solitary pulmonary nodule 04/30/2013   CHEST PAIN UNSPECIFIED 10/06/2009    ANEMIA-NOS 08/03/2009   Multiple sclerosis ([place]) 08/03/2009   Optic neuritis, right 08/03/2009   ALLERGIC RHINITIS, SEASONAL 08/03/2009   ASTHMA 08/03/2009   Hay fever 08/03/2009   History of disorder of central nervous system 08/03/2009   Mild intermittent asthma without complication [id number]    PCP: Lorayne Marek, NP  REFERRING PROVIDER: Lomax, NP  REFERRING DIAG: MS, weakness  THERAPY DIAG:  Muscle weakness (generalized)  Difficulty in walking, not elsewhere classified  Rationale for Evaluation and Treatment: Rehabilitation  ONSET DATE: January 2024  SUBJECTIVE:   SUBJECTIVE STATEMENT: Doing pretty good, I did fall out the bed my knee hurt a little bit but nothing drastic. This was Saturday. I am so happy because y'all are doing a great job because I am able to get in the car without having to physically lift my leg up.   PERTINENT HISTORY: See above PAIN:  Are you having pain? Yes: NPRS scale: 0/10 Pain location: low back Pain description: ache Aggravating factors: sitting  up to 6/10 Relieving factors: walk, tylenol  PRECAUTIONS: None  WEIGHT BEARING RESTRICTIONS: No  FALLS:  Has patient fallen in last 6 months? 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, cleans house  PATIENT GOALS: be stronger, walk better, less pain  NEXT MD VISIT: June 2024  OBJECTIVE:   COGNITION: Overall cognitive status: Within functional limits for tasks assessed     SENSATION: WFL  POSTURE: rounded shoulders and forward head  PALPATION: Tight in the low back, non tender  LOWER EXTREMITY ROM:  Lumbar ROM WNL's flexion, other motions decreased 25% with pain right side bending  Active ROM Right eval Left eval  Hip flexion 50 100  Hip extension    Hip abduction     Hip adduction    Hip internal rotation    Hip external rotation    Knee flexion    Knee extension 12 0  Ankle dorsiflexion 5 12  Ankle plantarflexion    Ankle inversion    Ankle eversion     (Blank rows = not tested)  LOWER EXTREMITY MMT:  MMT Right eval Left eval  Hip flexion 3+ 4+  Hip extension    Hip abduction 3+ 4+  Hip adduction    Hip internal rotation    Hip external rotation    Knee flexion 4- 4+  Knee extension 3+ 4+  Ankle dorsiflexion 4- 4+  Ankle plantarflexion 4- 4+  Ankle inversion 1 4+  Ankle eversion 1 4+   (Blank rows = not tested)  FUNCTIONAL TESTS:  Timed up and go (TUG): 15 3 minute walk test: 550' Berg Balance Scale: 44/56  GAIT: Distance walked: 500' Assistive device utilized: None Level of assistance: Complete Independence Comments: right LE decreased coordination, limp on the right  TODAY'S TREATMENT:                                                                                                                              DATE:  04/19/22 NuStep L5 x67mins 10# straight arm pulls 2x10 Leg ext 10# 2x10, 5# x10 HS curls 25# 2x10, 15# x10 S2S on airex 2x10 Leg press 40# 2x10  04/06/22 Nustep level 5 x 5 minutes 10# straight arm pulls Leg press 40# both then 20# single legs REsisted gait all directions 40# Yellow tband PF/DF right ankle PROM right ankle, prior to this she had no active inversion or eversion Did some AROM inversion and eversion Volley ball on and off airex Airex marching Airex balance beam side stepping and tandem walking  03/28/22 Nustep level 5 x 6 minutes 30# resisted gait all directions 5# straight arm pulls on airex Volleyball on airex Leg press 20# 2x10, 20# 5 x each leg 15# lats 2x10 Walking ball toss Walking direction changes 25# leg curls  both, right only 15# Feet on ball K2C, trunk rotation, small bridge and isometric abs  03/23/22 Nustep level 4 x 5 minutes 5# straight arm pulls 2x10 Side  stepping on the airex balance beam, Tandem walk on airex balance beam Cone toe touch on and off airex Side step on and off airex On airex ball toss, head  turns and eyes closed LEg curls 15# x10, right only 10# x10 Leg extension 5# x10, then right only with 2# and then 3# x 10 each Feet on ball K2C, bridges  PATIENT EDUCATION:  Education details: POC Person educated: Patient Education method: Explanation Education comprehension: verbalized understanding  HOME EXERCISE PROGRAM: TBD  ASSESSMENT:  CLINICAL IMPRESSION: Patient reports she did have a fall but it was not from losing her balance or anything she just rolled off the bed, which has never happened before. She states that PT has really been helping as she has noticed it has gotten easier getting in and out of the car. We continued working on LE strengthening and did some isolating of the R leg.    OBJECTIVE IMPAIRMENTS: Abnormal gait, cardiopulmonary status limiting activity, decreased activity tolerance, decreased balance, decreased coordination, decreased endurance, decreased mobility, difficulty walking, decreased ROM, decreased strength, increased muscle spasms, impaired flexibility, improper body mechanics, and pain.  REHAB POTENTIAL: Good  CLINICAL DECISION MAKING: Stable/uncomplicated  EVALUATION COMPLEXITY: Low   GOALS: Goals reviewed with patient? Yes  SHORT TERM GOALS: Target date: 03/15/22 Independent with initial HEP Goal status: met  LONG TERM GOALS: Target date: 05/30/22  Independent with advanced HEP or gym/pool Goal status: ongoing  2.  Increase Berg balance score to 49/56 Baseline: 44/56 Goal status: INITIAL  3.  Increase right hip flexion strength to 4/5 Baseline:  Goal status: ongoing  4.  Walk 1/2 mile without rest Baseline: 550 feet Goal status:ongoing  PLAN:  PT FREQUENCY: 1-2x/week  PT DURATION: 12 weeks  PLANNED INTERVENTIONS: Therapeutic exercises, Therapeutic activity,  Neuromuscular re-education, Balance training, Gait training, Patient/Family education, Self Care, Joint mobilization, Stair training, Electrical stimulation, and Manual therapy  PLAN FOR NEXT SESSION: continue to progress exercises for strength and balance   Andris Baumann, PT 04/19/2022, 6:45 PM

## 2022-04-27 ENCOUNTER — Ambulatory Visit: Payer: Federal, State, Local not specified - PPO | Attending: Family Medicine | Admitting: Physical Therapy

## 2022-04-27 ENCOUNTER — Encounter: Payer: Self-pay | Admitting: Physical Therapy

## 2022-04-27 DIAGNOSIS — M5459 Other low back pain: Secondary | ICD-10-CM

## 2022-04-27 DIAGNOSIS — R262 Difficulty in walking, not elsewhere classified: Secondary | ICD-10-CM | POA: Insufficient documentation

## 2022-04-27 DIAGNOSIS — M6281 Muscle weakness (generalized): Secondary | ICD-10-CM | POA: Insufficient documentation

## 2022-04-27 NOTE — Therapy (Signed)
OUTPATIENT PHYSICAL THERAPY LOWER EXTREMITY TREATMENT   Patient Name: Cathy [name] MRN: [id number] DOB:1965/05/16, 57 y.o., female Today's Date: 04/27/2022  END OF SESSION:  PT End of Session - 04/27/22 1805     Visit Number 6    Date for PT Re-Evaluation 05/30/22    PT Start Time 1801    PT Stop Time 1840    PT Time Calculation (min) 39 min    Activity Tolerance Patient tolerated treatment well    Behavior During Therapy WFL for tasks assessed/performed               Past Medical History:  Diagnosis Date   Diabetes mellitus without complication    Eczema    Hypertension    Multiple sclerosis    Vision abnormalities    Past Surgical History:  Procedure Laterality Date   ABDOMINAL HYSTERECTOMY     CARPAL TUNNEL RELEASE     CERVICAL SPINE SURGERY     Dr. Rennis Harding   LASER ABLATION     TENDON REPAIR Left    wrist surgery   TUBAL LIGATION     Patient Active Problem List   Diagnosis Date Noted   Hot flashes 10/28/2021   Primary osteoarthritis of right knee 07/15/2018   Right knee pain 06/20/2018   Ganglion cyst of dorsum of left wrist 03/14/2017   Leukopenia 12/29/2016   Dysesthesia 01/06/2016   Left shoulder pain 10/06/2015   Gait disturbance 09/03/2015   High risk medication use 05/21/2014   Vitamin D deficiency 05/21/2014   Other fatigue 05/21/2014   Disturbed cognition 05/21/2014   Attention deficit disorder 05/21/2014   Spinal stenosis in cervical region 11/29/2013   Insomnia 08/22/2013   Herniated nucleus pulposus with myelopathy, cervical 08/02/2013   Cervical nerve root disorder 07/29/2013   Essential (primary) hypertension 05/27/2013   Hyperlipidemia associated with type 2 diabetes mellitus 05/01/2013   DM (diabetes mellitus) 05/01/2013   DS (disseminated sclerosis) 04/30/2013   Solitary pulmonary nodule 04/30/2013   CHEST PAIN UNSPECIFIED 10/06/2009   ANEMIA-NOS 08/03/2009   Multiple sclerosis 08/03/2009   Optic neuritis,  right 08/03/2009   ALLERGIC RHINITIS, SEASONAL 08/03/2009   ASTHMA 08/03/2009   Hay fever 08/03/2009   History of disorder of central nervous system 08/03/2009   Mild intermittent asthma without complication [id number]    PCP: Lorayne Marek, NP  REFERRING PROVIDER: Lomax, NP  REFERRING DIAG: MS, weakness  THERAPY DIAG:  Muscle weakness (generalized)  Difficulty in walking, not elsewhere classified  Other low back pain  Rationale for Evaluation and Treatment: Rehabilitation  ONSET DATE: January 2024  SUBJECTIVE:   SUBJECTIVE STATEMENT: Doing pretty good, I did fall out the bed my knee hurt a little bit but nothing drastic. This was Saturday. I am so happy because y'all are doing a great job because I am able to get in the car without having to physically lift my leg up.   PERTINENT HISTORY: See above PAIN:  Are you having pain? Yes: NPRS scale: 0/10 Pain location: low back Pain description: ache Aggravating factors: sitting  up to 6/10 Relieving factors: walk, tylenol  PRECAUTIONS: None  WEIGHT BEARING RESTRICTIONS: No  FALLS:  Has patient fallen in last 6 months? 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, cleans house  PATIENT GOALS: be stronger, walk better, less  pain  NEXT MD VISIT: June 2024  OBJECTIVE:   COGNITION: Overall cognitive status: Within functional limits for tasks assessed     SENSATION: WFL  POSTURE: rounded shoulders and forward head  PALPATION: Tight in the low back, non tender  LOWER EXTREMITY ROM:  Lumbar ROM WNL's flexion, other motions decreased 25% with pain right side bending  Active ROM Right eval Left eval  Hip flexion 50 100  Hip extension    Hip abduction    Hip adduction    Hip internal rotation    Hip external  rotation    Knee flexion    Knee extension 12 0  Ankle dorsiflexion 5 12  Ankle plantarflexion    Ankle inversion    Ankle eversion     (Blank rows = not tested)  LOWER EXTREMITY MMT:  MMT Right eval Left eval  Hip flexion 3+ 4+  Hip extension    Hip abduction 3+ 4+  Hip adduction    Hip internal rotation    Hip external rotation    Knee flexion 4- 4+  Knee extension 3+ 4+  Ankle dorsiflexion 4- 4+  Ankle plantarflexion 4- 4+  Ankle inversion 1 4+  Ankle eversion 1 4+   (Blank rows = not tested)  FUNCTIONAL TESTS:  Timed up and go (TUG): 15 3 minute walk test: 550' Berg Balance Scale: 44/56  GAIT: Distance walked: 500' Assistive device utilized: None Level of assistance: Complete Independence Comments: right LE decreased coordination, limp on the right  TODAY'S TREATMENT:                                                                                                                              DATE:  04/27/22 NuStep L4 x 6 minutes B side stepping up onto and off blue air ex pad x 10 reps each direction Standing on Air ex pad, slow marching, CGA. SLS, alternate tapping on cones placed on the floor. Progressed form 1 cone to multiple cones to tapping multiple cones with rotation in SLS, occasional Min A 4 square stepping in both directions, increasing speed, then changing directions upon therapist's command. S, no unsteadiness. Sit to stand with 4# ball in BUE with OHP at the top, 10 reps Repeat while standing on the Air ex pad, chest press x 10 Pallof press, 10#, 2 x 10 each side Treadmill pulls x 10 with each leg.  04/19/22 NuStep L5 x17mins 10# straight arm pulls 2x10 Leg ext 10# 2x10, 5# x10 HS curls 25# 2x10, 15# x10 S2S on airex 2x10 Leg press 40# 2x10  04/06/22 Nustep level 5 x 5 minutes 10# straight arm pulls Leg press 40# both then 20# single legs REsisted gait all directions 40# Yellow tband PF/DF right ankle PROM right ankle, prior to this she  had no active inversion or eversion Did some AROM inversion and eversion Volley ball on and off airex Airex marching Airex balance beam side stepping and tandem  walking  03/28/22 Nustep level 5 x 6 minutes 30# resisted gait all directions 5# straight arm pulls on airex Volleyball on airex Leg press 20# 2x10, 20# 5 x each leg 15# lats 2x10 Walking ball toss Walking direction changes 25# leg curls  both, right only 15# Feet on ball K2C, trunk rotation, small bridge and isometric abs  03/23/22 Nustep level 4 x 5 minutes 5# straight arm pulls 2x10 Side stepping on the airex balance beam, Tandem walk on airex balance beam Cone toe touch on and off airex Side step on and off airex On airex ball toss, head turns and eyes closed LEg curls 15# x10, right only 10# x10 Leg extension 5# x10, then right only with 2# and then 3# x 10 each Feet on ball K2C, bridges  PATIENT EDUCATION:  Education details: POC Person educated: Patient Education method: Explanation Education comprehension: verbalized understanding  HOME EXERCISE PROGRAM: TBD  ASSESSMENT:  CLINICAL IMPRESSION: Patient reports no issues. She is enjoying the challenge of PT. Treatment focused on both strength and balance with multiple activities challenging her coordination and speed of movement. She tolerated all activities well, reports that they were challenging.   OBJECTIVE IMPAIRMENTS: Abnormal gait, cardiopulmonary status limiting activity, decreased activity tolerance, decreased balance, decreased coordination, decreased endurance, decreased mobility, difficulty walking, decreased ROM, decreased strength, increased muscle spasms, impaired flexibility, improper body mechanics, and pain.  REHAB POTENTIAL: Good  CLINICAL DECISION MAKING: Stable/uncomplicated  EVALUATION COMPLEXITY: Low   GOALS: Goals reviewed with patient? Yes  SHORT TERM GOALS: Target date: 03/15/22 Independent with initial HEP Goal status:  met  LONG TERM GOALS: Target date: 05/30/22  Independent with advanced HEP or gym/pool Goal status: ongoing  2.  Increase Berg balance score to 49/56 Baseline: 44/56 Goal status: INITIAL  3.  Increase right hip flexion strength to 4/5 Baseline:  Goal status: 04/27/22 ongoing  4.  Walk 1/2 mile without rest Baseline: 550 feet Goal status: 04/27/22 ongoing  PLAN:  PT FREQUENCY: 1-2x/week  PT DURATION: 12 weeks  PLANNED INTERVENTIONS: Therapeutic exercises, Therapeutic activity, Neuromuscular re-education, Balance training, Gait training, Patient/Family education, Self Care, Joint mobilization, Stair training, Electrical stimulation, and Manual therapy  PLAN FOR NEXT SESSION: continue to progress exercises for strength and balance   Marcelina Morel, DPT 04/27/2022, 6:45 PM

## 2022-05-02 ENCOUNTER — Other Ambulatory Visit: Payer: Self-pay | Admitting: Neurology

## 2022-05-02 MED ORDER — DEXTROAMPHETAMINE SULFATE ER 10 MG PO CP24: 20.0000 mg | ORAL_CAPSULE | Freq: Every day | ORAL | 0 refills | Status: DC

## 2022-05-02 NOTE — Telephone Encounter (Signed)
Last seen on 02/10/22 Follow up scheduled on 7/01/31/22 Last filled on 04/04/22 # 120 tablets (30 day supply) Rx pending to be signed

## 2022-05-04 ENCOUNTER — Ambulatory Visit: Payer: Federal, State, Local not specified - PPO | Admitting: Physical Therapy

## 2022-05-04 ENCOUNTER — Encounter: Payer: Self-pay | Admitting: Physical Therapy

## 2022-05-04 DIAGNOSIS — M5459 Other low back pain: Secondary | ICD-10-CM | POA: Diagnosis not present

## 2022-05-04 DIAGNOSIS — R262 Difficulty in walking, not elsewhere classified: Secondary | ICD-10-CM | POA: Diagnosis not present

## 2022-05-04 DIAGNOSIS — M6281 Muscle weakness (generalized): Secondary | ICD-10-CM | POA: Diagnosis not present

## 2022-05-04 NOTE — Therapy (Signed)
OUTPATIENT PHYSICAL THERAPY LOWER EXTREMITY TREATMENT   Patient Name: Cathy [name] MRN: [id number] DOB:January 26, 1965, 57 y.o., female Today's Date: 05/04/2022  END OF SESSION:  PT End of Session - 05/04/22 1723     Visit Number 7    Date for PT Re-Evaluation 05/30/22    PT Start Time 1720    PT Stop Time 1758    PT Time Calculation (min) 38 min                Past Medical History:  Diagnosis Date   Diabetes mellitus without complication    Eczema    Hypertension    Multiple sclerosis    Vision abnormalities    Past Surgical History:  Procedure Laterality Date   ABDOMINAL HYSTERECTOMY     CARPAL TUNNEL RELEASE     CERVICAL SPINE SURGERY     Dr. Sharolyn Douglas   LASER ABLATION     TENDON REPAIR Left    wrist surgery   TUBAL LIGATION     Patient Active Problem List   Diagnosis Date Noted   Hot flashes 10/28/2021   Primary osteoarthritis of right knee 07/15/2018   Right knee pain 06/20/2018   Ganglion cyst of dorsum of left wrist 03/14/2017   Leukopenia 12/29/2016   Dysesthesia 01/06/2016   Left shoulder pain 10/06/2015   Gait disturbance 09/03/2015   High risk medication use 05/21/2014   Vitamin D deficiency 05/21/2014   Other fatigue 05/21/2014   Disturbed cognition 05/21/2014   Attention deficit disorder 05/21/2014   Spinal stenosis in cervical region 11/29/2013   Insomnia 08/22/2013   Herniated nucleus pulposus with myelopathy, cervical 08/02/2013   Cervical nerve root disorder 07/29/2013   Essential (primary) hypertension 05/27/2013   Hyperlipidemia associated with type 2 diabetes mellitus 05/01/2013   DM (diabetes mellitus) 05/01/2013   DS (disseminated sclerosis) 04/30/2013   Solitary pulmonary nodule 04/30/2013   CHEST PAIN UNSPECIFIED 10/06/2009   ANEMIA-NOS 08/03/2009   Multiple sclerosis 08/03/2009   Optic neuritis, right 08/03/2009   ALLERGIC RHINITIS, SEASONAL 08/03/2009   ASTHMA 08/03/2009   Hay fever 08/03/2009   History of  disorder of central nervous system 08/03/2009   Mild intermittent asthma without complication 08/03/2009    PCP: Elease Etienne, NP  REFERRING PROVIDER: Lomax, NP  REFERRING DIAG: MS, weakness  THERAPY DIAG:  Muscle weakness (generalized)  Difficulty in walking, not elsewhere classified  Other low back pain  Rationale for Evaluation and Treatment: Rehabilitation  ONSET DATE: January 2024  SUBJECTIVE:   SUBJECTIVE STATEMENT: Patient reports that after her last treatment her back was bit sore. She took some Tylenol. It was better the next morning.   PERTINENT HISTORY: See above PAIN:  Are you having pain? Yes: NPRS scale: 0/10 Pain location: low back Pain description: ache Aggravating factors: sitting  up to 6/10 Relieving factors: walk, tylenol  PRECAUTIONS: None  WEIGHT BEARING RESTRICTIONS: No  FALLS:  Has patient fallen in last 6 months? 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, cleans house  PATIENT GOALS: be stronger, walk better, less pain  NEXT MD VISIT: June 2024  OBJECTIVE:   COGNITION: Overall cognitive status: Within functional limits for tasks assessed     SENSATION: WFL  POSTURE: rounded shoulders and forward head  PALPATION: Tight in the low back, non tender  LOWER  EXTREMITY ROM:  Lumbar ROM WNL's flexion, other motions decreased 25% with pain right side bending  Active ROM Right eval Left eval  Hip flexion 50 100  Hip extension    Hip abduction    Hip adduction    Hip internal rotation    Hip external rotation    Knee flexion    Knee extension 12 0  Ankle dorsiflexion 5 12  Ankle plantarflexion    Ankle inversion    Ankle eversion     (Blank rows = not tested)  LOWER EXTREMITY MMT:  MMT Right eval Left eval  Hip flexion  3+ 4+  Hip extension    Hip abduction 3+ 4+  Hip adduction    Hip internal rotation    Hip external rotation    Knee flexion 4- 4+  Knee extension 3+ 4+  Ankle dorsiflexion 4- 4+  Ankle plantarflexion 4- 4+  Ankle inversion 1 4+  Ankle eversion 1 4+   (Blank rows = not tested)  FUNCTIONAL TESTS:  Timed up and go (TUG): 15 3 minute walk test: 550' Berg Balance Scale: 44/56  GAIT: Distance walked: 500' Assistive device utilized: None Level of assistance: Complete Independence Comments: right LE decreased coordination, limp on the right  TODAY'S TREATMENT:                                                                                                                              DATE:  05/04/22 NuStep L4 x 6 minutes Standing shoulder ext, Rows, ER against G tband resistance. 2 x 10 each B side to side step against G tband, VC to maintain R knee control and not lock it. 10 Mini squats with VC to maintain midline and force RLE to work as much as it can, 10 reps B sidestepping over rails on the floor, 3 steps each direction, increasing speed with repetition. Stable, CGA, no LOB Upside down BOSU- static stand, lateral weight shifts, forward and back x 10 each, in parallel bars, no UE support needed, CGA.  04/27/22 NuStep L4 x 6 minutes B side stepping up onto and off blue air ex pad x 10 reps each direction Standing on Air ex pad, slow marching, CGA. SLS, alternate tapping on cones placed on the floor. Progressed form 1 cone to multiple cones to tapping multiple cones with rotation in SLS, occasional Min A 4 square stepping in both directions, increasing speed, then changing directions upon therapist's command. S, no unsteadiness. Sit to stand with 4# ball in BUE with OHP at the top, 10 reps Repeat while standing on the Air ex pad, chest press x 10 Pallof press, 10#, 2 x 10 each side Treadmill pulls x 10 with each leg.  04/19/22 NuStep L5 x46mins 10# straight arm pulls 2x10 Leg  ext 10# 2x10, 5# x10 HS curls 25# 2x10, 15# x10 S2S on airex 2x10 Leg press 40# 2x10  04/06/22 Nustep level 5 x 5 minutes 10# straight  arm pulls Leg press 40# both then 20# single legs REsisted gait all directions 40# Yellow tband PF/DF right ankle PROM right ankle, prior to this she had no active inversion or eversion Did some AROM inversion and eversion Volley ball on and off airex Airex marching Airex balance beam side stepping and tandem walking  03/28/22 Nustep level 5 x 6 minutes 30# resisted gait all directions 5# straight arm pulls on airex Volleyball on airex Leg press 20# 2x10, 20# 5 x each leg 15# lats 2x10 Walking ball toss Walking direction changes 25# leg curls  both, right only 15# Feet on ball K2C, trunk rotation, small bridge and isometric abs  03/23/22 Nustep level 4 x 5 minutes 5# straight arm pulls 2x10 Side stepping on the airex balance beam, Tandem walk on airex balance beam Cone toe touch on and off airex Side step on and off airex On airex ball toss, head turns and eyes closed LEg curls 15# x10, right only 10# x10 Leg extension 5# x10, then right only with 2# and then 3# x 10 each Feet on ball K2C, bridges  PATIENT EDUCATION:  Education details: POC Person educated: Patient Education method: Explanation Education comprehension: verbalized understanding  HOME EXERCISE PROGRAM: [id number]  ASSESSMENT:  CLINICAL IMPRESSION: Patient reports mild back pain after last treatment. Resolved by the next morning. Treatment continued to challenge speed, postural strength, and LE strength. She tolerated all activities with improved speed today.   OBJECTIVE IMPAIRMENTS: Abnormal gait, cardiopulmonary status limiting activity, decreased activity tolerance, decreased balance, decreased coordination, decreased endurance, decreased mobility, difficulty walking, decreased ROM, decreased strength, increased muscle spasms, impaired flexibility, improper body  mechanics, and pain.  REHAB POTENTIAL: Good  CLINICAL DECISION MAKING: Stable/uncomplicated  EVALUATION COMPLEXITY: Low   GOALS: Goals reviewed with patient? Yes  SHORT TERM GOALS: Target date: 03/15/22 Independent with initial HEP Goal status: met  LONG TERM GOALS: Target date: 05/30/22  Independent with advanced HEP or gym/pool Goal status: ongoing  2.  Increase Berg balance score to 49/56 Baseline: 44/56 Goal status: INITIAL  3.  Increase right hip flexion strength to 4/5 Baseline:  Goal status: 04/27/22 ongoing  4.  Walk 1/2 mile without rest Baseline: 550 feet Goal status: 05/04/22 ongoing  PLAN:  PT FREQUENCY: 1-2x/week  PT DURATION: 12 weeks  PLANNED INTERVENTIONS: Therapeutic exercises, Therapeutic activity, Neuromuscular re-education, Balance training, Gait training, Patient/Family education, Self Care, Joint mobilization, Stair training, Electrical stimulation, and Manual therapy  PLAN FOR NEXT SESSION: continue to progress exercises for strength and balance   Iona BeardSusan M Dawnna Gritz, DPT 05/04/2022, 6:00 PM

## 2022-05-10 ENCOUNTER — Ambulatory Visit: Payer: Federal, State, Local not specified - PPO

## 2022-06-02 ENCOUNTER — Other Ambulatory Visit: Payer: Self-pay | Admitting: Neurology

## 2022-06-02 MED ORDER — DEXTROAMPHETAMINE SULFATE ER 10 MG PO CP24: 20.0000 mg | ORAL_CAPSULE | Freq: Every day | ORAL | 0 refills | Status: DC

## 2022-06-04 ENCOUNTER — Telehealth: Payer: Self-pay | Admitting: Neurology

## 2022-06-04 ENCOUNTER — Other Ambulatory Visit: Payer: Self-pay | Admitting: Neurology

## 2022-06-04 MED ORDER — DEXTROAMPHETAMINE SULFATE ER 10 MG PO CP24: 20.0000 mg | ORAL_CAPSULE | Freq: Every day | ORAL | 0 refills | Status: DC

## 2022-06-04 NOTE — Telephone Encounter (Signed)
Patient paged over the weekend, Dr. Vickey Huger had refilled her prescription on May 9 in [facility] [place] but she wanted her prescription sent to [facility] instead of [facility].  I resent the prescription to [facility], I spoke to the pharmacist in [place] and she did not pick up her prescription in [place].  Problem is they may only have 60 in [place] so she may need another prescription this month for the rest , another 60.  I told her to call the office back and let us know what happens just in case she can't get the full 120 in [place] I guess she will need a new prescription for 60 in 2 weeks in [place]? as she gets 120 a month.  She will call the office and let us know what happens.

## 2022-07-04 ENCOUNTER — Other Ambulatory Visit: Payer: Self-pay | Admitting: Neurology

## 2022-07-04 NOTE — Telephone Encounter (Signed)
Please re fill and send to [facility] in [place].

## 2022-07-04 NOTE — Telephone Encounter (Signed)
Pt last seen 02/10/22 and next f/u 08/11/22. Last refilled 06/30/22 #120 at [facility] [id number]

## 2022-07-23 ENCOUNTER — Other Ambulatory Visit (HOSPITAL_COMMUNITY): Payer: Self-pay

## 2022-07-23 ENCOUNTER — Telehealth: Payer: Self-pay

## 2022-07-23 NOTE — Telephone Encounter (Signed)
Pharmacy Patient Advocate Encounter  Prior Authorization for Dextroamphetamine Sulfate ER 10MG  er capsules has been APPROVED by [facility] from 06/23/2022 to 07/23/2023.   PA # PA Case ID: [id number]  Key: Karolee Ohs

## 2022-08-01 ENCOUNTER — Other Ambulatory Visit: Payer: Self-pay | Admitting: Family Medicine

## 2022-08-01 MED ORDER — DEXTROAMPHETAMINE SULFATE ER 10 MG PO CP24: 20.0000 mg | ORAL_CAPSULE | Freq: Every day | ORAL | 0 refills | Status: DC

## 2022-08-01 NOTE — Telephone Encounter (Addendum)
Pt last seen on 02/10/22 Follow up scheduled on 08/11/22 Last filled on 07/05/22 #120 tablets (30 day supply) Pt said she called because she want to make sure the pharmacy has the medication at the pharmacy she report that sometime the pharmacy doesn't have  dextroamphetamine  in stock, but they do now.   Rx pending to be signed

## 2022-08-01 NOTE — Telephone Encounter (Signed)
Pt is requesting a refill for dextroamphetamine (DEXEDRINE) 10 MG 24 hr capsule. ° °Pharmacy:  [facility] #[id number] - ° °

## 2022-08-01 NOTE — Addendum Note (Signed)
Addended by: Aura Camps on: 08/01/2022 03:07 PM   Modules accepted: Orders

## 2022-08-11 ENCOUNTER — Telehealth: Payer: Self-pay | Admitting: Family Medicine

## 2022-08-11 ENCOUNTER — Ambulatory Visit: Payer: Federal, State, Local not specified - PPO | Admitting: Neurology

## 2022-08-11 NOTE — Telephone Encounter (Signed)
Pt cancelled appt due to not feeling well

## 2022-09-01 ENCOUNTER — Other Ambulatory Visit: Payer: Self-pay | Admitting: Family Medicine

## 2022-09-02 NOTE — Telephone Encounter (Signed)
Pt called stating that she is on her last dextroamphetamine (DEXEDRINE) 10 MG 24 hr capsule and is needing a refill as soon as possible. Please advise.

## 2022-09-03 MED ORDER — DEXTROAMPHETAMINE SULFATE ER 10 MG PO CP24: 20.0000 mg | ORAL_CAPSULE | Freq: Every day | ORAL | 0 refills | Status: DC

## 2022-09-03 NOTE — Telephone Encounter (Signed)
Patient called after hours call center for refill of her Dexedrine.  I sent Rx to pharmacy of choice. It looks like patient missed an appointment with Amy Lomax recently.

## 2022-09-14 ENCOUNTER — Other Ambulatory Visit: Payer: Self-pay | Admitting: Neurology

## 2022-09-14 ENCOUNTER — Other Ambulatory Visit: Payer: Self-pay | Admitting: Nurse Practitioner

## 2022-09-14 DIAGNOSIS — I10 Essential (primary) hypertension: Secondary | ICD-10-CM

## 2022-09-14 NOTE — Telephone Encounter (Signed)
Last seen on 02/10/22 Follow up scheduled on 09/19/22

## 2022-09-19 ENCOUNTER — Ambulatory Visit (INDEPENDENT_AMBULATORY_CARE_PROVIDER_SITE_OTHER): Payer: Federal, State, Local not specified - PPO | Admitting: Neurology

## 2022-09-19 ENCOUNTER — Encounter: Payer: Self-pay | Admitting: Neurology

## 2022-09-19 VITALS — BP 131/85 | HR 99 | Ht 66.0 in | Wt 200.0 lb

## 2022-09-19 DIAGNOSIS — F988 Other specified behavioral and emotional disorders with onset usually occurring in childhood and adolescence: Secondary | ICD-10-CM | POA: Diagnosis not present

## 2022-09-19 DIAGNOSIS — Z79899 Other long term (current) drug therapy: Secondary | ICD-10-CM

## 2022-09-19 DIAGNOSIS — M549 Dorsalgia, unspecified: Secondary | ICD-10-CM | POA: Diagnosis not present

## 2022-09-19 DIAGNOSIS — G35 Multiple sclerosis: Secondary | ICD-10-CM

## 2022-09-19 DIAGNOSIS — R208 Other disturbances of skin sensation: Secondary | ICD-10-CM

## 2022-09-19 DIAGNOSIS — G8929 Other chronic pain: Secondary | ICD-10-CM

## 2022-09-19 MED ORDER — DICLOFENAC SODIUM 1 % EX GEL: CUTANEOUS | 5 refills | Status: DC

## 2022-09-19 MED ORDER — DEXTROAMPHETAMINE SULFATE ER 10 MG PO CP24: ORAL_CAPSULE | ORAL | 0 refills | Status: DC

## 2022-09-19 NOTE — Progress Notes (Signed)
[facility]  PATIENT: Cathy [name] DOB: [birth year]  REFERRING DOCTOR OR PCP:  Synetta Fail  SOURCE: patient and medical records available  _________________________________   HISTORICAL  CHIEF COMPLAINT:  Chief Complaint  Patient presents with   Room 10    Pt is here Alone. Pt states that her back aches all the time. Pt states that she  is going well besides her back pain. Pt states that she doesn't have any othe symptoms or concerns to discuss today.     HISTORY OF PRESENT ILLNESS:  Cathy [name] is a 57 y.o. woman with MS andgait issues.  Update 09/19/2022: She switched to Zeposia due to high co-py (ws Gilenya) and is tolerating it well. .   She has no exacerbations recently. Brain MRi 2/4/224 was stable.    MRI cervical  spine 02/27/2022 shows several older foci bit none new since prior MRI   She is reporting more axial lower back pain without radiation.  MRI 02/2022 showed DJD with facet hypertrophy and bulges but no nerve root compression at L3-L4 through L5-S1.   Back pain is worse on the right.  She notes increased pain with sustained standing or walking.   Better if sitting..    She is on gabapentin and Tylenol without much be   No exacerbation pr new MS symptom.   Gait is about the same with a mild limp.   No falls but rare stumbles and se needs to be areful on stairs.    She notes mild right leg weakness.   She has some right leg tingling at times.      Bladder functon is fine.   Some constipation.    She notes no new vision issues and needs reading glasses.   Colors are dulled OD       Dextroamphetamine helps hr fatigue.  She takes 4/day with a lot of benefit fr ADD and fatigue.    She has insomnia about 1/3 the nights.   Does not know if she snores    She notes reduced STM and attention/focus.   Also mild verbal processing issues.   She is on dextroamphetamine 10 mg 4 pills in the morning -  helps her attention and fatigue.    Mood is  doing well for the most part.      She has lost 20 pounds with Ozempic for DM  MS History:   She was diagnosed with MS after presenting with optic neuritis x 2 in 2009.   She had an MRI and LP and was diagnosed with MS.     She started on Copaxone and switched to Gilenya in 2013.    She denies any further exacerbation since the optic neuritis but was told she had MS plaques on her spine when she presented last year with a cervical radiculopathy requiring surgery.    I have seen her the past year at [facility].   Her last MRI's were Jan 21, 2014.  MRI images show foci in the cerebellum and the hemispheres consistent with MS. There were no acute findings. She has NIDDM, HTN and high cholesterol.  Imaging: MRI cervical spine 06/27/18 showed several T2 hyperintense foci within the spinal cord.  These are located posteriorly adjacent to C2-C3, laterally to the left adjacent to C2-C3, laterally to the right adjacent to C6 and posterolaterally to the right adjacent to C7.  The foci were present on the 01/29/2016 .  Also mild spinal stenosis at [facility] and  C6C7.  She has C4-C6 fusion.  MRI Brain 06/27/18:   "There are T2/flair hyperintense foci in the hemispheres, left thalamus, left cerebellum and spinal cord in a pattern and configuration consistent with chronic demyelinating plaque associated with multiple sclerosis.  None of the foci are acute  MRI cervical spine 04/15/2020 showed patchy T2 hyperintensity within the cervical spine, discrete foci are located centrally adjacent to C2, posteriorly adjacent to C3, to the right adjacent to C6-C7 and to the right adjacent to C7-T1.  There did not appear to be any new lesions compared to the MRI from 2020.   There is metal artifact consistent with C3-C6 ACDF, unchanged compared to the 2020 MRI  MRI lumbar spine 02/27/2022 showed:  At L3-L4, there are degenerative changes causing borderline spinal stenosis but no nerve root compression.   At L4-L5, there are  degenerative changes causing minimal spinal stenosis and mild to moderate left foraminal narrowing but no nerve root compression.  At L5-S1, there are degenerative changes but no spinal stenosis or nerve root compression.  MRI brain 02/27/2022 showed Multiple T2/FLAIR hyperintense foci in the cerebral hemispheres with a couple foci in the cerebellum, left thalamus and spinal cord in a pattern consistent with chronic demyelinating plaque associated with multiple sclerosis.  None of the foci enhanced or appear to be acute.  Compared to the MRI from 06/27/2018, there were no new lesions.  MRI cervical spine 02/27/2022 showed :  T2 hyperintense foci at the cervicomedullary junction and adjacent to C2, C3, C6-C7 and C7-T1 consistent with chronic demyelinating plaque associated with multiple sclerosis.  None of these foci enhance.  Compared to the MRI from 2022, there are no new lesions.    Remote ACDF from C3-C6, unchanged in appearance    There is mild spinal stenosis at C5-C6 and C6-C7 as detailed above.  There is moderate foraminal narrowing to the right at C5-C6 and to the left at C6-C7 but there does not appear to be any nerve root compression.  Degenerative changes are unchanged compared to the previous MRI.    MRI cervical spine 02/27/2022 showed  REVIEW OF SYSTEMS: Constitutional: No fevers, chills, sweats, or change in appetite.   Has fatigue Eyes: H/o right ON with residual color desaturation Ear, nose and throat: No hearing loss, ear pain, nasal congestion, sore throat Cardiovascular: No chest pain, palpitations Respiratory:  No shortness of breath at rest or with exertion.   No wheezes GastrointestinaI: No nausea, vomiting, diarrhea, abdominal pain, fecal incontinence Genitourinary:  No dysuria, urinary retention or frequency.  No nocturia. Musculoskeletal:  No neck pain, back pain,. Notes left shoulder pain. Integumentary: No rash, pruritus, skin lesions Neurological: as above Psychiatric: No  depression at this time.  No anxiety Endocrine: has NIDDM.  No palpitations, diaphoresis, change in appetite, change in weigh or increased thirst Hematologic/Lymphatic:  No anemia, purpura, petechiae. Allergic/Immunologic: No itchy/runny eyes, nasal congestion, recent allergic reactions, rashes  ALLERGIES: Allergies  Allergen Reactions   Aspirin     Heartburn   Ibuprofen     heatburn    HOME MEDICATIONS:  Current Outpatient Medications:    Cholecalciferol (VITAMIN D) 2000 UNITS CAPS, Take 2,000 Units by mouth daily., Disp: , Rfl:    dextroamphetamine (DEXEDRINE) 10 MG 24 hr capsule, Take 2-4 capsules (20-40 mg total) by mouth daily., Disp: 120 capsule, Rfl: 0   diclofenac Sodium (VOLTAREN) 1 % GEL, Apply topically., Disp: , Rfl:    gabapentin (NEURONTIN) 300 MG capsule, TAKE 1 CAPSULE(300 MG) BY  MOUTH THREE TIMES DAILY, Disp: 90 capsule, Rfl: 0   loratadine (CLARITIN) 10 MG tablet, Take 10 mg by mouth daily., Disp: , Rfl:    losartan-hydrochlorothiazide (HYZAAR) 50-12.5 MG tablet, TAKE 1 TABLET BY MOUTH DAILY, Disp: 90 tablet, Rfl: 1   rosuvastatin (CRESTOR) 10 MG tablet, Take 1 tablet (10 mg total) by mouth daily., Disp: 90 tablet, Rfl: 3   Semaglutide, 1 MG/DOSE, (OZEMPIC, 1 MG/DOSE,) 4 MG/3ML SOPN, Inject 1 mg into the skin once a week., Disp: 9 mL, Rfl: 3   triamcinolone cream (KENALOG) 0.1 %, Apply topically 3 (three) times daily., Disp: 45 g, Rfl: 1   ZEPOSIA 0.92 MG CAPS, Take 1 capsule (0.92 mg total) by mouth daily., Disp: 30 capsule, Rfl: 11  PAST MEDICAL HISTORY: Past Medical History:  Diagnosis Date   Diabetes mellitus without complication (HCC)    Eczema    Hypertension    Multiple sclerosis (HCC)    Vision abnormalities     PAST SURGICAL HISTORY: Past Surgical History:  Procedure Laterality Date   ABDOMINAL HYSTERECTOMY     CARPAL TUNNEL RELEASE     CERVICAL SPINE SURGERY     Dr. Sharolyn Douglas   LASER ABLATION     TENDON REPAIR Left    wrist surgery   TUBAL  LIGATION      FAMILY HISTORY: Family History  Problem Relation Age of Onset   Hypertension Mother    Diabetes type II Mother    Stroke Mother    COPD Father    Multiple sclerosis Paternal Uncle    Multiple sclerosis Paternal Grandfather    Breast cancer Neg Hx     SOCIAL HISTORY:  Social History   Socioeconomic History   Marital status: Married    Spouse name: Anothony   Number of children: 3   Years of education: 12+   Highest education level: Not on file  Occupational History   Not on file  Tobacco Use   Smoking status: Never   Smokeless tobacco: Never  Vaping Use   Vaping status: Never Used  Substance and Sexual Activity   Alcohol use: Yes    Alcohol/week: 1.0 standard drink of alcohol    Types: 1 Glasses of wine per week    Comment: occasionally   Drug use: No   Sexual activity: Yes    Birth control/protection: Surgical  Other Topics Concern   Not on file  Social History Narrative   Right handed    Caffeine UVO:ZDGU sometimes.   Lives with husband,  Ethelene Browns   Social Determinants of Health   Financial Resource Strain: Not on [employer] Insecurity: Not on file  Transportation Needs: Not on file  Physical Activity: Not on file  Stress: Not on file  Social Connections: Not on file  Intimate Partner Violence: Not on file     PHYSICAL EXAM  Vitals:   09/19/22 1010  BP: 131/85  Pulse: 99  Weight: 200 lb (90.7 kg)  Height: 5\' 6"  (1.676 m)     Body mass index is 32.28 kg/m.   General: The patient is well-developed and well-nourished and in no acute distress.      Neurologic Exam  Mental status: The patient is alert and oriented x 3 at the time of the examination. The patient has apparent normal recent and remote memory, with an apparently normal attention span and concentration ability.   Speech is normal.  Cranial nerves: Extraocular movements are full.  Facial strength and sensation was normal.  Trapezius strength is normal.     No obvious  hearing deficits are noted.  Motor:  Muscle bulk is normal.   Muscle tone is mildly increased in the right leg and strength was 4+/5 in the right foot and left triceps.  .  Strength is 5/5 elsewhere.      Sensory: intact touch/vibration sensation  Coordination: Cerebellar testing reveals good finger-nose-finger and reduced right heel to shin.  Gait and station: Station is normal.   Gait is mildly wide.  She has difficulty with tandem gait.  Romberg is negative.  Reflexes: Deep tendon reflexes are symmetric and normal bilaterally.         1. Multiple sclerosis (HCC)   2. High risk medication use   3. Chronic bilateral back pain, unspecified back location   4. Attention deficit disorder, unspecified hyperactivity presence   5. Dysesthesia      1.   Continue Zeposis for MS.  We will check blood work today.   2.   Continue  Dexedrine for fatigue and attention deficit.     3.   Try to stay active and exercise as tolerated.    Lower back exercises and PT 4.   Return in 6 months or sooner if there are new or worsening neurologic symptoms.   Jinan Biggins A. Epimenio Foot, MD, PhD 09/19/2022, 10:26 AM Certified in Neurology, Clinical Neurophysiology, Sleep Medicine, Pain Medicine and Neuroimaging  [facility] [address] [place], [place] [address] [phone] -

## 2022-09-20 LAB — COMPREHENSIVE METABOLIC PANEL
ALT: 18 IU/L (ref 0–32)
AST: 16 IU/L (ref 0–40)
Albumin: 4.4 g/dL (ref 3.8–4.9)
Alkaline Phosphatase: 114 IU/L (ref 44–121)
BUN/Creatinine Ratio: 14 (ref 9–23)
BUN: 11 mg/dL (ref 6–24)
Bilirubin Total: 0.3 mg/dL (ref 0.0–1.2)
CO2: 25 mmol/L (ref 20–29)
Calcium: 9.8 mg/dL (ref 8.7–10.2)
Chloride: 102 mmol/L (ref 96–106)
Creatinine, Ser: 0.8 mg/dL (ref 0.57–1.00)
Globulin, Total: 2.1 g/dL (ref 1.5–4.5)
Glucose: 112 mg/dL — ABNORMAL HIGH (ref 70–99)
Potassium: 3.7 mmol/L (ref 3.5–5.2)
Sodium: 142 mmol/L (ref 134–144)
Total Protein: 6.5 g/dL (ref 6.0–8.5)
eGFR: 86 mL/min/{1.73_m2} (ref >59)

## 2022-09-20 LAB — CBC WITH DIFFERENTIAL/PLATELET
Basophils Absolute: 0 10*3/uL (ref 0.0–0.2)
Basos: 1 %
EOS (ABSOLUTE): 0.1 10*3/uL (ref 0.0–0.4)
Eos: 4 %
Hematocrit: 40.2 % (ref 34.0–46.6)
Hemoglobin: 13.7 g/dL (ref 11.1–15.9)
Immature Grans (Abs): 0 10*3/uL (ref 0.0–0.1)
Immature Granulocytes: 0 %
Lymphocytes Absolute: 0.5 10*3/uL — ABNORMAL LOW (ref 0.7–3.1)
Lymphs: 15 %
MCH: 30.4 pg (ref 26.6–33.0)
MCHC: 34.1 g/dL (ref 31.5–35.7)
MCV: 89 fL (ref 79–97)
Monocytes Absolute: 0.3 10*3/uL (ref 0.1–0.9)
Monocytes: 9 %
Neutrophils Absolute: 2.3 10*3/uL (ref 1.4–7.0)
Neutrophils: 71 %
Platelets: 337 10*3/uL (ref 150–450)
RBC: 4.51 x10E6/uL (ref 3.77–5.28)
RDW: 12.4 % (ref 11.7–15.4)
WBC: 3.2 10*3/uL — ABNORMAL LOW (ref 3.4–10.8)

## 2022-09-22 ENCOUNTER — Encounter: Payer: Self-pay | Admitting: Neurology

## 2022-10-13 ENCOUNTER — Other Ambulatory Visit: Payer: Self-pay | Admitting: Neurology

## 2022-10-13 MED ORDER — DEXTROAMPHETAMINE SULFATE ER 10 MG PO CP24: ORAL_CAPSULE | ORAL | 0 refills | Status: DC

## 2022-10-25 ENCOUNTER — Ambulatory Visit (INDEPENDENT_AMBULATORY_CARE_PROVIDER_SITE_OTHER): Payer: Federal, State, Local not specified - PPO | Admitting: Nurse Practitioner

## 2022-10-25 ENCOUNTER — Encounter: Payer: Self-pay | Admitting: Nurse Practitioner

## 2022-10-25 VITALS — BP 130/80 | HR 83 | Temp 97.6°F | Ht 66.0 in | Wt 200.2 lb

## 2022-10-25 DIAGNOSIS — Z7985 Long-term (current) use of injectable non-insulin antidiabetic drugs: Secondary | ICD-10-CM

## 2022-10-25 DIAGNOSIS — R0989 Other specified symptoms and signs involving the circulatory and respiratory systems: Secondary | ICD-10-CM | POA: Diagnosis not present

## 2022-10-25 DIAGNOSIS — E1165 Type 2 diabetes mellitus with hyperglycemia: Secondary | ICD-10-CM | POA: Diagnosis not present

## 2022-10-25 DIAGNOSIS — R230 Cyanosis: Secondary | ICD-10-CM

## 2022-10-25 DIAGNOSIS — Z23 Encounter for immunization: Secondary | ICD-10-CM | POA: Diagnosis not present

## 2022-10-25 DIAGNOSIS — R209 Unspecified disturbances of skin sensation: Secondary | ICD-10-CM | POA: Insufficient documentation

## 2022-10-25 DIAGNOSIS — E785 Hyperlipidemia, unspecified: Secondary | ICD-10-CM

## 2022-10-25 DIAGNOSIS — E1169 Type 2 diabetes mellitus with other specified complication: Secondary | ICD-10-CM | POA: Diagnosis not present

## 2022-10-25 MED ORDER — OZEMPIC (1 MG/DOSE) 4 MG/3ML ~~LOC~~ SOPN: 1.0000 mg | PEN_INJECTOR | SUBCUTANEOUS | 1 refills | Status: DC

## 2022-10-25 NOTE — Assessment & Plan Note (Addendum)
Denies any pain No known CAD or PAD or PVD No tobacco use Scheduled for ABI study 10/26/22 at 3:30pm

## 2022-10-25 NOTE — Patient Instructions (Addendum)
Psychologytoday or therapyforblackgirl  Go to lab  You have an appointment at Vascular clinic tomorrow at 3:30pm  [address] [place], [place] Need to arrive before appointment.  Go to ED if you develop any pain

## 2022-10-25 NOTE — Assessment & Plan Note (Signed)
Denies any adverse effects with ozempic Advised to schedule appointment for DIABETES eye exam Repeat hgbA1c and UACr

## 2022-10-25 NOTE — Assessment & Plan Note (Signed)
Repeat direct LDL No adverse effects with crestor

## 2022-10-25 NOTE — Progress Notes (Signed)
Established Patient Visit  Patient: Cathy [name]   DOB: April 15, 1965   57 y.o. Female  MRN: [id number] Visit Date: 10/25/2022  Subjective:    Chief Complaint  Patient presents with   Medication Refill    Needs refill on Ozempic and Flu vaccine   DM (diabetes mellitus) (HCC) Denies any adverse effects with ozempic Advised to schedule appointment for DIABETES eye exam Repeat hgbA1c and UACr   Hyperlipidemia associated with type 2 diabetes mellitus (HCC) Repeat direct LDL No adverse effects with crestor  Absent foot pulse Denies any pain Scheduled for ABI study 10/26/22 at 3:30pm  Wt Readings from Last 3 Encounters:  10/25/22 200 lb 3.2 oz (90.8 kg)  09/19/22 200 lb (90.7 kg)  02/10/22 203 lb (92.1 kg)    Reviewed medical, surgical, and social history today  Medications: Outpatient Medications Prior to Visit  Medication Sig   Cholecalciferol (VITAMIN D) 2000 UNITS CAPS Take 2,000 Units by mouth daily.   dextroamphetamine (DEXEDRINE) 10 MG 24 hr capsule Take 4 po qD   diclofenac Sodium (VOLTAREN) 1 % GEL Apply one inch to lower back once a day   gabapentin (NEURONTIN) 300 MG capsule TAKE 1 CAPSULE(300 MG) BY MOUTH THREE TIMES DAILY   loratadine (CLARITIN) 10 MG tablet Take 10 mg by mouth daily.   losartan-hydrochlorothiazide (HYZAAR) 50-12.5 MG tablet TAKE 1 TABLET BY MOUTH DAILY   rosuvastatin (CRESTOR) 10 MG tablet Take 1 tablet (10 mg total) by mouth daily.   triamcinolone cream (KENALOG) 0.1 % Apply topically 3 (three) times daily.   ZEPOSIA 0.92 MG CAPS Take 1 capsule (0.92 mg total) by mouth daily.   [DISCONTINUED] Semaglutide, 1 MG/DOSE, (OZEMPIC, 1 MG/DOSE,) 4 MG/3ML SOPN Inject 1 mg into the skin once a week.   No facility-administered medications prior to visit.   Reviewed past medical and social history.   ROS per HPI above      Objective:  BP 130/80   Pulse 83   Temp 97.6 F (36.4 C) (Temporal)   Ht 5\' 6"  (1.676 m)   Wt  200 lb 3.2 oz (90.8 kg)   SpO2 98%   BMI 32.31 kg/m      Physical Exam Vitals and nursing note reviewed.  Cardiovascular:     Rate and Rhythm: Normal rate and regular rhythm.     Pulses:          Dorsalis pedis pulses are 0 on the right side and 2+ on the left side.       Posterior tibial pulses are 0 on the right side and 2+ on the left side.     Heart sounds: Normal heart sounds.  Pulmonary:     Effort: Pulmonary effort is normal.     Breath sounds: Normal breath sounds.  Musculoskeletal:        General: No swelling or tenderness.     Right lower leg: No edema.     Left lower leg: No edema.  Feet:     Right foot:     Protective Sensation: 6 sites tested.  4 sites sensed.     Skin integrity: No ulcer, skin breakdown, erythema, callus, dry skin or fissure.     Toenail Condition: Right toenails are abnormally thick and long.     Left foot:     Protective Sensation: 6 sites tested.  6 sites sensed.     Skin integrity: Skin integrity normal.  Toenail Condition: Left toenails are abnormally thick and long.     Comments: Right foot is pale and cold to touch. Denies any pain Skin:    Coloration: Skin is pale.  Neurological:     Mental Status: She is alert and oriented to person, place, and time.     No results found for any visits on 10/25/22.    Assessment & Plan:    Problem List Items Addressed This Visit     Absent foot pulse    Denies any pain Scheduled for ABI study 10/26/22 at 3:30pm      Relevant Orders   VAS [place] ABI WITH/WO TBI   DM (diabetes mellitus) (HCC) - Primary    Denies any adverse effects with ozempic Advised to schedule appointment for DIABETES eye exam Repeat hgbA1c and UACr       Relevant Medications   Semaglutide, 1 MG/DOSE, (OZEMPIC, 1 MG/DOSE,) 4 MG/3ML SOPN   Other Relevant Orders   Microalbumin / creatinine urine ratio   Hemoglobin A1c   Hyperlipidemia associated with type 2 diabetes mellitus (HCC)    Repeat direct LDL No adverse  effects with crestor      Relevant Medications   Semaglutide, 1 MG/DOSE, (OZEMPIC, 1 MG/DOSE,) 4 MG/3ML SOPN   Other Relevant Orders   Direct LDL   Other Visit Diagnoses     Immunization due       Relevant Orders   Flu vaccine trivalent PF, 6mos and older(Flulaval,Afluria,Fluarix,Fluzone) (Completed)   Localized cyanosis       Relevant Orders   VAS [place] ABI WITH/WO TBI      Return in about 6 months (around 04/25/2023) for HTN, DM, hyperlipidemia (fasting).     Alysia Penna, NP

## 2022-10-26 ENCOUNTER — Ambulatory Visit (HOSPITAL_COMMUNITY)
Admission: RE | Admit: 2022-10-26 | Discharge: 2022-10-26 | Disposition: A | Discharge status: Home / Self Care | Payer: Federal, State, Local not specified - PPO | Source: Ambulatory Visit | Attending: Nurse Practitioner | Admitting: Nurse Practitioner

## 2022-10-26 DIAGNOSIS — R0989 Other specified symptoms and signs involving the circulatory and respiratory systems: Secondary | ICD-10-CM | POA: Insufficient documentation

## 2022-10-26 DIAGNOSIS — R230 Cyanosis: Secondary | ICD-10-CM | POA: Diagnosis not present

## 2022-10-26 LAB — MICROALBUMIN / CREATININE URINE RATIO
Creatinine,U: 71.8 mg/dL
Microalb Creat Ratio: 1 mg/g (ref 0.0–30.0)
Microalb, Ur: <0.7 mg/dL (ref 0.0–1.9)

## 2022-10-26 LAB — LDL CHOLESTEROL, DIRECT: Direct LDL: 87 mg/dL

## 2022-10-26 LAB — VAS US ABI WITH/WO TBI
Left ABI: 1.19
Right ABI: 1.17

## 2022-10-26 LAB — HEMOGLOBIN A1C: Hgb A1c MFr Bld: 5.9 % (ref 4.6–6.5)

## 2022-10-27 ENCOUNTER — Other Ambulatory Visit: Payer: Self-pay | Admitting: Nurse Practitioner

## 2022-10-27 DIAGNOSIS — E1169 Type 2 diabetes mellitus with other specified complication: Secondary | ICD-10-CM

## 2022-10-27 MED ORDER — ROSUVASTATIN CALCIUM 20 MG PO TABS: 20.0000 mg | ORAL_TABLET | Freq: Every day | ORAL | 3 refills | Status: DC

## 2022-10-27 NOTE — Addendum Note (Signed)
Addended by: Alysia Penna L on: 10/27/2022 12:50 PM   Modules accepted: Orders

## 2022-10-27 NOTE — Addendum Note (Signed)
Addended by: Michaela Corner on: 10/27/2022 08:57 AM   Modules accepted: Orders

## 2022-10-31 ENCOUNTER — Encounter: Payer: Self-pay | Admitting: Nurse Practitioner

## 2022-10-31 ENCOUNTER — Ambulatory Visit (INDEPENDENT_AMBULATORY_CARE_PROVIDER_SITE_OTHER): Payer: Federal, State, Local not specified - PPO | Admitting: Nurse Practitioner

## 2022-10-31 ENCOUNTER — Telehealth: Payer: Self-pay | Admitting: Nurse Practitioner

## 2022-10-31 VITALS — BP 131/74 | HR 95 | Temp 98.2°F | Ht 66.0 in | Wt 198.0 lb

## 2022-10-31 DIAGNOSIS — R197 Diarrhea, unspecified: Secondary | ICD-10-CM | POA: Diagnosis not present

## 2022-10-31 DIAGNOSIS — B9681 Helicobacter pylori [H. pylori] as the cause of diseases classified elsewhere: Secondary | ICD-10-CM | POA: Diagnosis not present

## 2022-10-31 DIAGNOSIS — R110 Nausea: Secondary | ICD-10-CM | POA: Diagnosis not present

## 2022-10-31 DIAGNOSIS — A048 Other specified bacterial intestinal infections: Secondary | ICD-10-CM | POA: Diagnosis not present

## 2022-10-31 DIAGNOSIS — R209 Unspecified disturbances of skin sensation: Secondary | ICD-10-CM | POA: Diagnosis not present

## 2022-10-31 NOTE — Progress Notes (Signed)
Established Patient Visit  Patient: Cathy [name]   DOB: Jan 15, 1966   57 y.o. Female  MRN: [id number] Visit Date: 10/31/2022  Subjective:    Chief Complaint  Patient presents with   Follow-up    Cold right foot   Diarrhea  This is a new problem. The current episode started 1 to 4 weeks ago. The problem occurs 2 to 4 times per day. The problem has been waxing and waning. The stool consistency is described as Watery. The patient states that diarrhea awakens her from sleep. Associated symptoms include abdominal pain and bloating. Pertinent negatives include no arthralgias, chills, coughing, fever, headaches, increased  flatus, myalgias, sweats, URI, vomiting or weight loss. Associated symptoms comments: nausea. Nothing aggravates the symptoms. There are no known risk factors. She has tried nothing for the symptoms. constipation  No blood in stool. No rectal pain Ozempic dose administered on Thursday, diarrhea and nausea is worse from Friday to Sunday. Last colonoscopy 2015 by [facility]: normal Had cologuard completed 2024: negative She denies Fhx of any GI cancer.  Cold right foot Associated with dusky skin color, mild foot swelling, nonpitting, no erythema Able to palpate diminished dorsalis pedis +1, unable to palpate posterior tibialis. Thick toenails, no ulcers. She endorses chronic right LE weakness secondary to MS. She denies any pain or paresthesia, or claudication.  Normal ABI 10/26/2022 Collaboration with neurology-Dr. Epimenio Foot: he does not this this is related to MS diagnosis. Entered referral to vascular specialist: waiting for call back from Triage nurse-Angela Neas.  Reviewed medical, surgical, and social history today  Medications: Outpatient Medications Prior to Visit  Medication Sig   Cholecalciferol (VITAMIN D) 2000 UNITS CAPS Take 2,000 Units by mouth daily.   dextroamphetamine (DEXEDRINE) 10 MG 24 hr capsule Take 4 po qD   diclofenac  Sodium (VOLTAREN) 1 % GEL Apply one inch to lower back once a day   gabapentin (NEURONTIN) 300 MG capsule TAKE 1 CAPSULE(300 MG) BY MOUTH THREE TIMES DAILY   loratadine (CLARITIN) 10 MG tablet Take 10 mg by mouth daily.   losartan-hydrochlorothiazide (HYZAAR) 50-12.5 MG tablet TAKE 1 TABLET BY MOUTH DAILY   rosuvastatin (CRESTOR) 20 MG tablet Take 1 tablet (20 mg total) by mouth daily.   Semaglutide, 1 MG/DOSE, (OZEMPIC, 1 MG/DOSE,) 4 MG/3ML SOPN Inject 1 mg into the skin once a week.   triamcinolone cream (KENALOG) 0.1 % Apply topically 3 (three) times daily.   ZEPOSIA 0.92 MG CAPS Take 1 capsule (0.92 mg total) by mouth daily.   No facility-administered medications prior to visit.   Reviewed past medical and social history.   ROS per HPI above      Objective:  BP 131/74 (BP Location: Right Arm, Patient Position: Sitting)   Pulse 95   Temp 98.2 F (36.8 C) (Temporal)   Ht 5\' 6"  (1.676 m)   Wt 198 lb (89.8 kg)   SpO2 97%   BMI 31.96 kg/m      Physical Exam Vitals and nursing note reviewed.  Cardiovascular:     Rate and Rhythm: Normal rate and regular rhythm.  Neurological:     Mental Status: She is alert.     No results found for any visits on 10/31/22.    Assessment & Plan:    Problem List Items Addressed This Visit     Cold right foot - Primary    Associated with dusky skin color, mild foot  swelling, nonpitting, no erythema Able to palpate diminished dorsalis pedis +1, unable to palpate posterior tibialis. Thick toenails, no ulcers. She endorses chronic right LE weakness secondary to MS. She denies any pain or paresthesia, or claudication.  Normal ABI 10/26/2022 Collaboration with neurology-Dr. Epimenio Foot: he does not this this is related to MS diagnosis. Entered referral to vascular specialist: waiting for call back from Triage nurse-Angela Neas.      Other Visit Diagnoses     Nausea       Relevant Orders   Comprehensive metabolic panel   Lipase   Stool  Culture   Clostridium difficile culture-fecal   H. pylori breath test   Diarrhea, unspecified type       Relevant Orders   Comprehensive metabolic panel   Lipase   Stool Culture   Clostridium difficile culture-fecal      Diarrhea and nausea possible related to ozempic. Advised to hold ozempic dose this week. Will f/up in 1week by telephone.  Return if symptoms worsen or fail to improve.     Alysia Penna, NP

## 2022-10-31 NOTE — Assessment & Plan Note (Addendum)
Associated with dusky skin color, mild foot swelling, nonpitting, no erythema Able to palpate diminished dorsalis pedis +1, unable to palpate posterior tibialis. Thick toenails, no ulcers. She endorses chronic right LE weakness secondary to MS. She denies any pain or paresthesia, or claudication.  Normal ABI 10/26/2022 Collaboration with neurology-Dr. Epimenio Foot: he does not this this is related to MS diagnosis. Entered referral to vascular specialist: waiting for call back from Triage nurse-Angela Neas.

## 2022-10-31 NOTE — Patient Instructions (Signed)
Hold ozempic this week. Maintain adequate oral hydration and high fiber diet. Start probiotic- align or curturelle 1cap daily. Go to lab

## 2022-11-01 ENCOUNTER — Telehealth: Payer: Self-pay

## 2022-11-01 DIAGNOSIS — A048 Other specified bacterial intestinal infections: Secondary | ICD-10-CM | POA: Insufficient documentation

## 2022-11-01 LAB — COMPREHENSIVE METABOLIC PANEL
ALT: 11 U/L (ref 0–35)
AST: 11 U/L (ref 0–37)
Albumin: 4.4 g/dL (ref 3.5–5.2)
Alkaline Phosphatase: 84 U/L (ref 39–117)
BUN: 10 mg/dL (ref 6–23)
CO2: 29 meq/L (ref 19–32)
Calcium: 10.1 mg/dL (ref 8.4–10.5)
Chloride: 102 meq/L (ref 96–112)
Creatinine, Ser: 0.76 mg/dL (ref 0.40–1.20)
GFR: 87.29 mL/min (ref >60.00)
Glucose, Bld: 113 mg/dL — ABNORMAL HIGH (ref 70–99)
Potassium: 3.9 meq/L (ref 3.5–5.1)
Sodium: 138 meq/L (ref 135–145)
Total Bilirubin: 0.6 mg/dL (ref 0.2–1.2)
Total Protein: 6.7 g/dL (ref 6.0–8.3)

## 2022-11-01 LAB — H. PYLORI BREATH TEST: H. pylori Breath Test: DETECTED — AB

## 2022-11-01 LAB — LIPASE: Lipase: 6 U/L — ABNORMAL LOW (ref 11.0–59.0)

## 2022-11-01 MED ORDER — PANTOPRAZOLE SODIUM 40 MG PO TBEC: 40.0000 mg | DELAYED_RELEASE_TABLET | Freq: Every day | ORAL | 0 refills | Status: DC

## 2022-11-01 MED ORDER — METRONIDAZOLE 500 MG PO TABS: 500.0000 mg | ORAL_TABLET | Freq: Two times a day (BID) | ORAL | 0 refills | Status: DC

## 2022-11-01 MED ORDER — CLARITHROMYCIN 500 MG PO TABS: 500.0000 mg | ORAL_TABLET | Freq: Two times a day (BID) | ORAL | 0 refills | Status: DC

## 2022-11-01 NOTE — Telephone Encounter (Signed)
Cathy Penna, NP with [facility] called stating that the pt was in her office on 10/7. She noted that the referral was denied, but the pt still has a cold R foot, dusky in color, faint pulse. Pt denies pain, sores, or blue toe syndrome. She is aware that the ABI done last week is normal. She still wants pt seen by vascular.   Spoke with [place], [place], [place], RVS, and Dr. Lenell Antu who advised that if her supervising MD was aware that the ABI was normal and still wanted the consult, this office would get the pt scheduled.  Called Cathy Penna, NP and relayed the advise. She stated that she asked the MD before she called yesterday. Informed her that the pt would be called and an appt would be scheduled.  Called pt, two identifiers used. Appt scheduled for 10/18. Confirmed understanding.

## 2022-11-01 NOTE — Addendum Note (Signed)
Addended by: Alysia Penna L on: 11/01/2022 03:06 PM   Modules accepted: Orders

## 2022-11-01 NOTE — Telephone Encounter (Signed)
error 

## 2022-11-07 ENCOUNTER — Encounter: Payer: Self-pay | Admitting: Nurse Practitioner

## 2022-11-07 ENCOUNTER — Telehealth: Payer: Self-pay

## 2022-11-07 NOTE — Telephone Encounter (Signed)
Tried to call patient and unable to leave a message as voicemail box is full.

## 2022-11-07 NOTE — Telephone Encounter (Signed)
-----   Message from [place] sent at 10/31/2022  4:03 PM EDT ----- Regarding: Diarrhea and nausea Ask if she is still experiencing nausea and diarrhea after discontinuation of ozempic?  CN

## 2022-11-08 NOTE — Telephone Encounter (Signed)
Tried to call patient and voicemail box full. Unable to leave a message.

## 2022-11-09 NOTE — Progress Notes (Signed)
Patient ID: Cathy [name], female   DOB: May 17, 1965, 57 y.o.   MRN: [id number]  Reason for Consult: New Patient (Initial Visit)   Referred by Anne Ng, NP  Subjective:     HPI  Cathy [name] is a 57 y.o. female with HTN, HLD and DM presenting for R cold foot and discoloration.  She reports that towards the end of the day after taking her socks off she can notice some right foot coolness and some discoloration along with some swelling at the ankle and foot.  She denies claudication or nocturnal rest pain.  She denies nonhealing wounds.  She denies varicosities.  She does have some mild swelling towards the end of the day but denies aching, heaviness or fatigue. She is a never smoker and reports compliance with aspirin and statin  Past Medical History:  Diagnosis Date   Diabetes mellitus without complication (HCC)    Eczema    Hay fever 08/03/2009   Overview:   Overview:   Qualifier: Diagnosis of   By: Janit Bern      Hypertension    Multiple sclerosis (HCC)    Vision abnormalities    Family History  Problem Relation Age of Onset   Hypertension Mother    Diabetes type II Mother    Stroke Mother    COPD Father    Multiple sclerosis Paternal Uncle    Multiple sclerosis Paternal Grandfather    Breast cancer Neg Hx    Colon cancer Neg Hx    Stomach cancer Neg Hx    Rectal cancer Neg Hx    Past Surgical History:  Procedure Laterality Date   ABDOMINAL HYSTERECTOMY     CARPAL TUNNEL RELEASE     CERVICAL SPINE SURGERY     Dr. Sharolyn Douglas   LASER ABLATION     TENDON REPAIR Left    wrist surgery   TUBAL LIGATION      Short Social History:  Social History   Tobacco Use   Smoking status: Never   Smokeless tobacco: Never  Substance Use Topics   Alcohol use: Yes    Alcohol/week: 1.0 standard drink of alcohol    Types: 1 Glasses of wine per week    Comment: occasionally    Allergies  Allergen Reactions   Aspirin     Heartburn    Ibuprofen     heatburn    Current Outpatient Medications  Medication Sig Dispense Refill   Cholecalciferol (VITAMIN D) 2000 UNITS CAPS Take 2,000 Units by mouth daily.     clarithromycin (BIAXIN) 500 MG tablet Take 1 tablet (500 mg total) by mouth 2 (two) times daily. 28 tablet 0   dextroamphetamine (DEXEDRINE) 10 MG 24 hr capsule Take 4 po qD 120 capsule 0   diclofenac Sodium (VOLTAREN) 1 % GEL Apply one inch to lower back once a day 100 g 5   gabapentin (NEURONTIN) 300 MG capsule TAKE 1 CAPSULE(300 MG) BY MOUTH THREE TIMES DAILY 90 capsule 0   loratadine (CLARITIN) 10 MG tablet Take 10 mg by mouth daily.     losartan-hydrochlorothiazide (HYZAAR) 50-12.5 MG tablet TAKE 1 TABLET BY MOUTH DAILY 90 tablet 1   metroNIDAZOLE (FLAGYL) 500 MG tablet Take 1 tablet (500 mg total) by mouth 2 (two) times daily. 28 tablet 0   pantoprazole (PROTONIX) 40 MG tablet Take 1 tablet (40 mg total) by mouth daily. 14 tablet 0   rosuvastatin (CRESTOR) 20 MG tablet Take 1 tablet (20 mg  total) by mouth daily. 90 tablet 3   Semaglutide, 1 MG/DOSE, (OZEMPIC, 1 MG/DOSE,) 4 MG/3ML SOPN Inject 1 mg into the skin once a week. 9 mL 1   ZEPOSIA 0.92 MG CAPS Take 1 capsule (0.92 mg total) by mouth daily. 30 capsule 11   No current facility-administered medications for this visit.    REVIEW OF SYSTEMS  Negative other than noted in HPI     Objective:  Objective   Vitals:   11/11/22 1020  BP: 131/78  Pulse: 94  Temp: 98 F (36.7 C)  TempSrc: Temporal  SpO2: 97%  Weight: 201 lb 1.6 oz (91.2 kg)  Height: 5\' 6"  (1.676 m)   Body mass index is 32.46 kg/m.  Physical Exam General: no acute distress Cardiac: hemodynamically stable, nontachycardic Pulm: normal work of breathing GI: non-tender, no pulsatile mass Neuro: alert, no focal deficit Extremities: no edema, cyanosis or wounds Vascular:   Right: Palpable femoral, DP, PT  Left: Palpable femoral, DP,  PT   Data: ABI +---------+------------------+-----+---------+--------+  Right   Rt Pressure (mmHg)IndexWaveform Comment   +---------+------------------+-----+---------+--------+  Brachial 172                                       +---------+------------------+-----+---------+--------+  PTA     201               1.17 triphasic          +---------+------------------+-----+---------+--------+  DP      169               0.98 triphasic          +---------+------------------+-----+---------+--------+  [place] Toe128               0.74 Normal             +---------+------------------+-----+---------+--------+   +---------+------------------+-----+---------+-------+  Left    Lt Pressure (mmHg)IndexWaveform Comment  +---------+------------------+-----+---------+-------+  Brachial 172                                      +---------+------------------+-----+---------+-------+  PTA     188               1.09 triphasic         +---------+------------------+-----+---------+-------+  DP      205               1.19 triphasic         +---------+------------------+-----+---------+-------+  Great Toe141               0.82 Normal            +---------+------------------+-----+---------+-------+   Most recent A1c 10/25/2022: 5.9  Most recent creatinine 11/04/2022: 0.76     Assessment/Plan:     Cathy [name] is a 57 y.o. female with HTN, HLD and DM presenting for R foot discoloration and subjective coldness. Explained that she has normal perfusion. She is a never smoker and on best medical therapy with well controlled medical comorbidities. She is at minimal to no risk of developing arterial disease.  For her mild swelling I recommended compression stockings and intermittent elevation to prevent progression. Follow up as need.    Recommendations to optimize cardiovascular risk: Abstinence from all tobacco products. Blood  glucose control with goal A1c < 7%. Blood pressure control with goal  blood pressure < 140/90 mmHg. Lipid reduction therapy with goal LDL-C <100 mg/dL  Aspirin 81mg  PO QD.  Atorvastatin 40-80mg  PO QD (or other "high intensity" statin therapy).     Daria Pastures MD [facility]

## 2022-11-11 ENCOUNTER — Ambulatory Visit (INDEPENDENT_AMBULATORY_CARE_PROVIDER_SITE_OTHER): Payer: Federal, State, Local not specified - PPO | Admitting: Vascular Surgery

## 2022-11-11 ENCOUNTER — Encounter: Payer: Self-pay | Admitting: Vascular Surgery

## 2022-11-11 VITALS — BP 131/78 | HR 94 | Temp 98.0°F | Ht 66.0 in | Wt 201.1 lb

## 2022-11-11 DIAGNOSIS — I10 Essential (primary) hypertension: Secondary | ICD-10-CM | POA: Diagnosis not present

## 2022-11-11 DIAGNOSIS — M7989 Other specified soft tissue disorders: Secondary | ICD-10-CM | POA: Diagnosis not present

## 2022-11-11 DIAGNOSIS — E119 Type 2 diabetes mellitus without complications: Secondary | ICD-10-CM

## 2022-11-11 DIAGNOSIS — E785 Hyperlipidemia, unspecified: Secondary | ICD-10-CM | POA: Diagnosis not present

## 2022-11-14 ENCOUNTER — Other Ambulatory Visit: Payer: Self-pay | Admitting: *Deleted

## 2022-11-14 MED ORDER — GABAPENTIN 300 MG PO CAPS: 300.0000 mg | ORAL_CAPSULE | Freq: Three times a day (TID) | ORAL | 5 refills | Status: DC

## 2022-11-14 NOTE — Telephone Encounter (Signed)
Last seen on 09/19/22 Follow up scheduled on 04/06/23

## 2022-11-15 ENCOUNTER — Other Ambulatory Visit: Payer: Self-pay | Admitting: Neurology

## 2022-11-15 DIAGNOSIS — R197 Diarrhea, unspecified: Secondary | ICD-10-CM | POA: Diagnosis not present

## 2022-11-15 DIAGNOSIS — A048 Other specified bacterial intestinal infections: Secondary | ICD-10-CM | POA: Diagnosis not present

## 2022-11-16 ENCOUNTER — Other Ambulatory Visit: Payer: Self-pay

## 2022-11-16 MED ORDER — DEXTROAMPHETAMINE SULFATE ER 10 MG PO CP24: ORAL_CAPSULE | ORAL | 0 refills | Status: DC

## 2022-11-16 NOTE — Telephone Encounter (Signed)
Last seen 09/19/22 and next f/u 04/06/23. Last refilled 10/13/22 #120.

## 2022-11-18 ENCOUNTER — Other Ambulatory Visit: Payer: Self-pay

## 2022-11-18 ENCOUNTER — Other Ambulatory Visit: Payer: Federal, State, Local not specified - PPO

## 2022-11-18 DIAGNOSIS — R197 Diarrhea, unspecified: Secondary | ICD-10-CM

## 2022-11-18 DIAGNOSIS — A048 Other specified bacterial intestinal infections: Secondary | ICD-10-CM | POA: Diagnosis not present

## 2022-11-20 LAB — STOOL CULTURE: E coli, Shiga toxin Assay: NEGATIVE

## 2022-11-24 LAB — HELICOBACTER PYLORI  SPECIAL ANTIGEN
MICRO NUMBER:: 15651507
SPECIMEN QUALITY: ADEQUATE

## 2022-11-24 LAB — CLOSTRIDIUM DIFFICILE CULTURE-FECAL

## 2022-11-25 ENCOUNTER — Telehealth: Payer: Self-pay

## 2022-11-25 ENCOUNTER — Other Ambulatory Visit: Payer: Self-pay

## 2022-11-25 ENCOUNTER — Other Ambulatory Visit: Payer: Federal, State, Local not specified - PPO

## 2022-11-25 DIAGNOSIS — R197 Diarrhea, unspecified: Secondary | ICD-10-CM

## 2022-11-25 NOTE — Telephone Encounter (Signed)
Phone call for lab visit patient verbalized understanding and appointment was set.

## 2022-11-28 ENCOUNTER — Other Ambulatory Visit (INDEPENDENT_AMBULATORY_CARE_PROVIDER_SITE_OTHER): Payer: Federal, State, Local not specified - PPO

## 2022-11-28 DIAGNOSIS — E1169 Type 2 diabetes mellitus with other specified complication: Secondary | ICD-10-CM

## 2022-11-28 DIAGNOSIS — E785 Hyperlipidemia, unspecified: Secondary | ICD-10-CM

## 2022-11-28 LAB — LIPID PANEL
Cholesterol: 171 mg/dL (ref 0–200)
HDL: 54.6 mg/dL (ref >39.00)
LDL Cholesterol: 81 mg/dL (ref 0–99)
NonHDL: 116.89
Total CHOL/HDL Ratio: 3
Triglycerides: 178 mg/dL — ABNORMAL HIGH (ref 0.0–149.0)
VLDL: 35.6 mg/dL (ref 0.0–40.0)

## 2022-12-05 MED ORDER — ROSUVASTATIN CALCIUM 40 MG PO TABS: 40.0000 mg | ORAL_TABLET | Freq: Every day | ORAL | 1 refills | Status: DC

## 2022-12-05 NOTE — Addendum Note (Signed)
Addended by: Michaela Corner on: 12/05/2022 03:23 PM   Modules accepted: Orders

## 2022-12-13 ENCOUNTER — Other Ambulatory Visit: Payer: Federal, State, Local not specified - PPO

## 2022-12-13 DIAGNOSIS — R197 Diarrhea, unspecified: Secondary | ICD-10-CM | POA: Diagnosis not present

## 2022-12-15 DIAGNOSIS — H25012 Cortical age-related cataract, left eye: Secondary | ICD-10-CM | POA: Diagnosis not present

## 2022-12-15 DIAGNOSIS — H524 Presbyopia: Secondary | ICD-10-CM | POA: Diagnosis not present

## 2022-12-15 DIAGNOSIS — H2513 Age-related nuclear cataract, bilateral: Secondary | ICD-10-CM | POA: Diagnosis not present

## 2022-12-15 DIAGNOSIS — H40003 Preglaucoma, unspecified, bilateral: Secondary | ICD-10-CM | POA: Diagnosis not present

## 2022-12-15 DIAGNOSIS — H5202 Hypermetropia, left eye: Secondary | ICD-10-CM | POA: Diagnosis not present

## 2022-12-15 DIAGNOSIS — G35 Multiple sclerosis: Secondary | ICD-10-CM | POA: Diagnosis not present

## 2022-12-15 DIAGNOSIS — H52203 Unspecified astigmatism, bilateral: Secondary | ICD-10-CM | POA: Diagnosis not present

## 2022-12-15 DIAGNOSIS — E119 Type 2 diabetes mellitus without complications: Secondary | ICD-10-CM | POA: Diagnosis not present

## 2022-12-15 LAB — CLOSTRIDIUM DIFFICILE BY PCR

## 2022-12-15 LAB — HM DIABETES EYE EXAM

## 2022-12-16 ENCOUNTER — Other Ambulatory Visit: Payer: Self-pay | Admitting: Neurology

## 2022-12-16 NOTE — Addendum Note (Signed)
Addended by: Michaela Corner on: 12/16/2022 02:49 PM   Modules accepted: Orders

## 2022-12-19 MED ORDER — DEXTROAMPHETAMINE SULFATE ER 10 MG PO CP24: ORAL_CAPSULE | ORAL | 0 refills | Status: DC

## 2022-12-19 NOTE — Telephone Encounter (Signed)
Last seen on 09/19/22 Follow up scheduled 04/06/23 Last filled on 11/17/22 #120 30 day supply Rx pending to be signed

## 2022-12-21 ENCOUNTER — Telehealth: Payer: Self-pay

## 2022-12-21 NOTE — Telephone Encounter (Signed)
Spoke to patient and informed her that new stool collection will be needed. Patient verbalized understanding and will stop by the clinic next week to collect.

## 2023-01-05 ENCOUNTER — Other Ambulatory Visit: Payer: Federal, State, Local not specified - PPO

## 2023-01-06 DIAGNOSIS — R197 Diarrhea, unspecified: Secondary | ICD-10-CM | POA: Diagnosis not present

## 2023-01-10 ENCOUNTER — Other Ambulatory Visit (HOSPITAL_COMMUNITY): Payer: Self-pay

## 2023-01-11 NOTE — Addendum Note (Signed)
Addended by: Michaela Corner on: 01/11/2023 08:32 AM   Modules accepted: Orders

## 2023-01-12 ENCOUNTER — Other Ambulatory Visit: Payer: Self-pay | Admitting: Neurology

## 2023-01-12 LAB — C DIFFICILE, CYTOTOXIN B

## 2023-01-12 LAB — C DIFFICILE TOXINS A+B W/RFLX: C difficile Toxins A+B, EIA: NEGATIVE

## 2023-01-16 MED ORDER — DEXTROAMPHETAMINE SULFATE ER 10 MG PO CP24: ORAL_CAPSULE | ORAL | 0 refills | Status: DC

## 2023-01-16 NOTE — Telephone Encounter (Signed)
Last seen on 09/19/22 Follow up scheduled 04/06/23 Last filled on 12/19/22 Rx pending to be signed

## 2023-01-30 ENCOUNTER — Encounter: Payer: Self-pay | Admitting: Nurse Practitioner

## 2023-02-06 ENCOUNTER — Telehealth: Payer: Self-pay | Admitting: *Deleted

## 2023-02-06 ENCOUNTER — Ambulatory Visit: Payer: Federal, State, Local not specified - PPO | Admitting: Internal Medicine

## 2023-02-06 ENCOUNTER — Encounter: Payer: Self-pay | Admitting: Neurology

## 2023-02-06 NOTE — Telephone Encounter (Signed)
 Pt sent mychart message stating PA needed for Zeposia

## 2023-02-07 ENCOUNTER — Other Ambulatory Visit (HOSPITAL_COMMUNITY): Payer: Self-pay

## 2023-02-07 ENCOUNTER — Telehealth: Payer: Self-pay

## 2023-02-07 NOTE — Telephone Encounter (Signed)
 PA request has been Approved. New Encounter created for follow up. For additional info see Pharmacy Prior Auth telephone encounter from 02/07/2023.

## 2023-02-07 NOTE — Telephone Encounter (Signed)
 Pharmacy Patient Advocate Encounter  Received notification from [facility] that Prior Authorization for Zeposia  0.92MG  capsules has been APPROVED from 01/08/2023 to 02/07/2024. Unable to obtain price due to refill too soon rejection, last fill date 02/07/2023 next available fill date1/22/2025   PA #/Case ID/Reference #: N/A  Key: [id number]

## 2023-02-07 NOTE — Telephone Encounter (Signed)
 Sent mychart message to pt letting her know about approval.

## 2023-02-15 ENCOUNTER — Telehealth: Payer: Self-pay

## 2023-02-15 ENCOUNTER — Other Ambulatory Visit (HOSPITAL_COMMUNITY): Payer: Self-pay

## 2023-02-15 NOTE — Telephone Encounter (Signed)
Pharmacy Patient Advocate Encounter   Received notification from  [facility] Portal that prior authorization for Ozempic (1 MG/DOSE) 4MG /3ML pen-injectors is required/requested.   Insurance verification completed.   The patient is insured through [facility] .   Per test claim: PA required; PA submitted to above mentioned insurance via CoverMyMeds Key/confirmation #/EOC [id number] Status is pending

## 2023-02-16 ENCOUNTER — Other Ambulatory Visit: Payer: Self-pay | Admitting: Nurse Practitioner

## 2023-02-16 ENCOUNTER — Other Ambulatory Visit (HOSPITAL_COMMUNITY): Payer: Self-pay

## 2023-02-16 ENCOUNTER — Other Ambulatory Visit: Payer: Self-pay | Admitting: Neurology

## 2023-02-16 DIAGNOSIS — E1169 Type 2 diabetes mellitus with other specified complication: Secondary | ICD-10-CM

## 2023-02-16 MED ORDER — DEXTROAMPHETAMINE SULFATE ER 10 MG PO CP24: ORAL_CAPSULE | ORAL | 0 refills | Status: DC

## 2023-02-16 NOTE — Addendum Note (Signed)
Addended by: Arther Abbott on: 02/16/2023 01:54 PM   Modules accepted: Orders

## 2023-02-16 NOTE — Telephone Encounter (Signed)
Pharmacy Patient Advocate Encounter  Received notification from [facility] that Prior Authorization for Ozempic (1 MG/DOSE) 4MG /3ML pen-injectors  has been APPROVED from 02/15/23 to 02/15/24. Ran test claim, Copay is $473.99. This test claim was processed through [facility]- copay amounts may vary at other pharmacies due to pharmacy/plan contracts, or as the patient moves through the different stages of their insurance plan.   CMM Key #:  [id number]

## 2023-02-16 NOTE — Telephone Encounter (Signed)
Pt requesting refill be sent to   [facility] [phone]

## 2023-02-16 NOTE — Telephone Encounter (Signed)
Called [facility] at [phone]. They are closed for lunch until 2pm. I left message asking they cancel rx dextroamphetamine (DEXEDRINE) 10 MG 24 hr capsule sent in today.

## 2023-02-17 ENCOUNTER — Other Ambulatory Visit: Payer: Self-pay | Admitting: Neurology

## 2023-02-17 DIAGNOSIS — G35 Multiple sclerosis: Secondary | ICD-10-CM | POA: Diagnosis not present

## 2023-02-17 DIAGNOSIS — H25012 Cortical age-related cataract, left eye: Secondary | ICD-10-CM | POA: Diagnosis not present

## 2023-02-17 DIAGNOSIS — E119 Type 2 diabetes mellitus without complications: Secondary | ICD-10-CM | POA: Diagnosis not present

## 2023-02-17 DIAGNOSIS — H5713 Ocular pain, bilateral: Secondary | ICD-10-CM | POA: Diagnosis not present

## 2023-02-18 ENCOUNTER — Other Ambulatory Visit: Payer: Self-pay | Admitting: Nurse Practitioner

## 2023-02-18 ENCOUNTER — Encounter: Payer: Self-pay | Admitting: Neurology

## 2023-02-18 DIAGNOSIS — I10 Essential (primary) hypertension: Secondary | ICD-10-CM

## 2023-02-20 ENCOUNTER — Ambulatory Visit: Payer: Federal, State, Local not specified - PPO | Admitting: Nurse Practitioner

## 2023-02-20 ENCOUNTER — Encounter: Payer: Self-pay | Admitting: Nurse Practitioner

## 2023-02-20 ENCOUNTER — Other Ambulatory Visit (HOSPITAL_COMMUNITY): Payer: Self-pay

## 2023-02-20 ENCOUNTER — Telehealth: Payer: Self-pay | Admitting: *Deleted

## 2023-02-20 ENCOUNTER — Telehealth: Payer: Self-pay

## 2023-02-20 VITALS — BP 80/60 | HR 108 | Temp 97.8°F | Resp 18 | Wt 197.0 lb

## 2023-02-20 DIAGNOSIS — G35 Multiple sclerosis: Secondary | ICD-10-CM | POA: Diagnosis not present

## 2023-02-20 DIAGNOSIS — I951 Orthostatic hypotension: Secondary | ICD-10-CM | POA: Diagnosis not present

## 2023-02-20 DIAGNOSIS — I10 Essential (primary) hypertension: Secondary | ICD-10-CM

## 2023-02-20 DIAGNOSIS — Z7985 Long-term (current) use of injectable non-insulin antidiabetic drugs: Secondary | ICD-10-CM

## 2023-02-20 DIAGNOSIS — N3000 Acute cystitis without hematuria: Secondary | ICD-10-CM

## 2023-02-20 DIAGNOSIS — E1169 Type 2 diabetes mellitus with other specified complication: Secondary | ICD-10-CM

## 2023-02-20 DIAGNOSIS — E876 Hypokalemia: Secondary | ICD-10-CM

## 2023-02-20 LAB — COMPREHENSIVE METABOLIC PANEL
ALT: 14 U/L (ref 0–35)
AST: 17 U/L (ref 0–37)
Albumin: 4.6 g/dL (ref 3.5–5.2)
Alkaline Phosphatase: 116 U/L (ref 39–117)
BUN: 13 mg/dL (ref 6–23)
CO2: 31 meq/L (ref 19–32)
Calcium: 10.4 mg/dL (ref 8.4–10.5)
Chloride: 94 meq/L — ABNORMAL LOW (ref 96–112)
Creatinine, Ser: 1.02 mg/dL (ref 0.40–1.20)
GFR: 61.19 mL/min (ref >60.00)
Glucose, Bld: 191 mg/dL — ABNORMAL HIGH (ref 70–99)
Potassium: 2.7 meq/L — CL (ref 3.5–5.1)
Sodium: 135 meq/L (ref 135–145)
Total Bilirubin: 0.9 mg/dL (ref 0.2–1.2)
Total Protein: 7.4 g/dL (ref 6.0–8.3)

## 2023-02-20 LAB — CBC WITH DIFFERENTIAL/PLATELET
Basophils Absolute: 0 10*3/uL (ref 0.0–0.1)
Basophils Relative: 0.9 % (ref 0.0–3.0)
Eosinophils Absolute: 0.1 10*3/uL (ref 0.0–0.7)
Eosinophils Relative: 1.7 % (ref 0.0–5.0)
HCT: 42.1 % (ref 36.0–46.0)
Hemoglobin: 14.1 g/dL (ref 12.0–15.0)
Lymphocytes Relative: 15.6 % (ref 12.0–46.0)
Lymphs Abs: 0.8 10*3/uL (ref 0.7–4.0)
MCHC: 33.5 g/dL (ref 30.0–36.0)
MCV: 88.4 fL (ref 78.0–100.0)
Monocytes Absolute: 0.5 10*3/uL (ref 0.1–1.0)
Monocytes Relative: 9.4 % (ref 3.0–12.0)
Neutro Abs: 3.6 10*3/uL (ref 1.4–7.7)
Neutrophils Relative %: 72.4 % (ref 43.0–77.0)
Platelets: 406 10*3/uL — ABNORMAL HIGH (ref 150.0–400.0)
RBC: 4.77 Mil/uL (ref 3.87–5.11)
RDW: 13.3 % (ref 11.5–15.5)
WBC: 5 10*3/uL (ref 4.0–10.5)

## 2023-02-20 LAB — CK: Total CK: 66 U/L (ref 7–177)

## 2023-02-20 LAB — HEMOGLOBIN A1C: Hgb A1c MFr Bld: 9.5 % — ABNORMAL HIGH (ref 4.6–6.5)

## 2023-02-20 LAB — TSH: TSH: 2.04 u[IU]/mL (ref 0.35–5.50)

## 2023-02-20 MED ORDER — POTASSIUM CHLORIDE CRYS ER 20 MEQ PO TBCR: 40.0000 meq | EXTENDED_RELEASE_TABLET | Freq: Every day | ORAL | 0 refills | Status: DC

## 2023-02-20 NOTE — Assessment & Plan Note (Addendum)
Did not take ozempic x 2months due to fatigue and anorexia Check hgbA1c-9.5%, normal renal function Advised to resume ozempic and maintain small frequent meals F/up in 3months

## 2023-02-20 NOTE — Progress Notes (Addendum)
Acute Office Visit  Subjective:    Patient ID: Cathy [name], female    DOB: [birth year], 58 y.o.   MRN: [id number]  Chief Complaint  Patient presents with   OFFICE VISIT     PT C/O of dizziness, weakness and lightheaded for a couple of weeks symptoms will come and go    HPI Accompanied by husband today.  Patient is in today for generalized weakness, anorexia, light headedness x 2months No syncope Reports chronic Intermittent diarrhea, under the care of GI, negative stool culture, H pylori and C.diff Hydration status:3x16oz bottles of water daily She Stopped ozempic for 2months- above symptoms did not improve. she resumed injections 3weeks ago. Reports she if not taking farxiga nor janumet. No glucose, nor BP check at home.  Wt Readings from Last 3 Encounters:  02/20/23 197 lb (89.4 kg)  11/11/22 201 lb 1.6 oz (91.2 kg)  10/31/22 198 lb (89.8 kg)    DM (diabetes mellitus) (HCC) Did not take ozempic x 2months due to fatigue and anorexia Check hgbA1c-9.5%, normal renal function Advised to resume ozempic and maintain small frequent meals F/up in 3months  Essential (primary) hypertension Hold losartan/hydrochlorothiazide due to hypotension BP Readings from Last 3 Encounters:  02/20/23 (!) 80/60  11/11/22 131/78  10/31/22 131/74    Check cbc, tsh, cmp Advised to maintain adequate oral hydration, change positions slowly Monitor BP at home daily She is to take losartan/hydrochlorothiazide only if BP>120/70 F/up in 2weeks  Multiple sclerosis (HCC) Current use of Gilenya Under the care of Dr. Epimenio Foot   Outpatient Medications Prior to Visit  Medication Sig   albuterol (PROVENTIL) (2.5 MG/3ML) 0.083% nebulizer solution Inhale into the lungs.   atorvastatin (LIPITOR) 40 MG tablet Take by mouth.   Cholecalciferol (VITAMIN D) 2000 UNITS CAPS Take 2,000 Units by mouth daily.   dextroamphetamine (DEXEDRINE) 10 MG 24 hr capsule Take 4 po qD   diclofenac Sodium  (VOLTAREN) 1 % GEL Apply one inch to lower back once a day   Fingolimod HCl (GILENYA) 0.5 MG CAPS Take by mouth.   gabapentin (NEURONTIN) 300 MG capsule Take 1 capsule (300 mg total) by mouth 3 (three) times daily.   glucose blood (ACCU-CHEK GUIDE TEST) test strip Check blood sugars daily and as needed   loratadine (CLARITIN) 10 MG tablet Take 10 mg by mouth daily.   melatonin 1 MG TABS tablet Take by mouth.   pantoprazole (PROTONIX) 40 MG tablet Take 1 tablet (40 mg total) by mouth daily.   rosuvastatin (CRESTOR) 40 MG tablet Take 1 tablet (40 mg total) by mouth daily.   Semaglutide, 1 MG/DOSE, (OZEMPIC, 1 MG/DOSE,) 4 MG/3ML SOPN Inject 1 mg into the skin once a week.   ZEPOSIA 0.92 MG CAPS TAKE 1 CAPSULE BY MOUTH 1 TIME A DAY   [DISCONTINUED] clarithromycin (BIAXIN) 500 MG tablet Take 1 tablet (500 mg total) by mouth 2 (two) times daily.   [DISCONTINUED] dapagliflozin propanediol (FARXIGA) 10 MG TABS tablet Take by mouth.   [DISCONTINUED] losartan-hydrochlorothiazide (HYZAAR) 50-12.5 MG tablet TAKE 1 TABLET BY MOUTH DAILY   [DISCONTINUED] metroNIDAZOLE (FLAGYL) 500 MG tablet Take 1 tablet (500 mg total) by mouth 2 (two) times daily.   [DISCONTINUED] SitaGLIPtin-MetFORMIN HCl (JANUMET XR) [phone] MG TB24 Take 1 tablet by mouth daily.   No facility-administered medications prior to visit.    Reviewed past medical and social history.  Review of Systems Per HPI     Objective:    Physical Exam Vitals and nursing note  reviewed.  Eyes:     Extraocular Movements: Extraocular movements intact.     Conjunctiva/sclera: Conjunctivae normal.     Pupils: Pupils are equal, round, and reactive to light.  Cardiovascular:     Rate and Rhythm: Normal rate and regular rhythm.     Pulses: Normal pulses.     Heart sounds: Normal heart sounds.  Pulmonary:     Effort: Pulmonary effort is normal.     Breath sounds: Normal breath sounds.  Musculoskeletal:     Right lower leg: No edema.     Left  lower leg: No edema.  Neurological:     Mental Status: She is alert and oriented to person, place, and time.     Cranial Nerves: No cranial nerve deficit.     Motor: Weakness present.     Gait: Gait abnormal.  Psychiatric:        Mood and Affect: Mood normal.        Behavior: Behavior normal.        Thought Content: Thought content normal.    BP (!) 80/60 (BP Location: Left Arm, Patient Position: Standing, Cuff Size: Normal)   Pulse (!) 108   Temp 97.8 F (36.6 C) (Temporal)   Resp 18   Wt 197 lb (89.4 kg)   SpO2 99%   BMI 31.80 kg/m    Results for orders placed or performed in visit on 02/20/23  Comprehensive metabolic panel  Result Value Ref Range   Sodium 135 135 - 145 mEq/L   Potassium 2.7 (LL) 3.5 - 5.1 mEq/L   Chloride 94 (L) 96 - 112 mEq/L   CO2 31 19 - 32 mEq/L   Glucose, Bld 191 (H) 70 - 99 mg/dL   BUN 13 6 - 23 mg/dL   Creatinine, Ser 8.65 0.40 - 1.20 mg/dL   Total Bilirubin 0.9 0.2 - 1.2 mg/dL   Alkaline Phosphatase 116 39 - 117 U/L   AST 17 0 - 37 U/L   ALT 14 0 - 35 U/L   Total Protein 7.4 6.0 - 8.3 g/dL   Albumin 4.6 3.5 - 5.2 g/dL   GFR 78.46 >96.29 mL/min   Calcium 10.4 8.4 - 10.5 mg/dL  CBC with Differential/Platelet  Result Value Ref Range   WBC 5.0 4.0 - 10.5 K/uL   RBC 4.77 3.87 - 5.11 Mil/uL   Hemoglobin 14.1 12.0 - 15.0 g/dL   HCT 52.8 41.3 - 24.4 %   MCV 88.4 78.0 - 100.0 fl   MCHC 33.5 30.0 - 36.0 g/dL   RDW 01.0 27.2 - 53.6 %   Platelets 406.0 (H) 150.0 - 400.0 K/uL   Neutrophils Relative % 72.4 43.0 - 77.0 %   Lymphocytes Relative 15.6 12.0 - 46.0 %   Monocytes Relative 9.4 3.0 - 12.0 %   Eosinophils Relative 1.7 0.0 - 5.0 %   Basophils Relative 0.9 0.0 - 3.0 %   Neutro Abs 3.6 1.4 - 7.7 K/uL   Lymphs Abs 0.8 0.7 - 4.0 K/uL   Monocytes Absolute 0.5 0.1 - 1.0 K/uL   Eosinophils Absolute 0.1 0.0 - 0.7 K/uL   Basophils Absolute 0.0 0.0 - 0.1 K/uL  TSH  Result Value Ref Range   TSH 2.04 0.35 - 5.50 uIU/mL  CK  Result Value Ref  Range   Total CK 66 7 - 177 U/L  Hemoglobin A1c  Result Value Ref Range   Hgb A1c MFr Bld 9.5 (H) 4.6 - 6.5 %  Assessment & Plan:   Problem List Items Addressed This Visit     DM (diabetes mellitus) (HCC)   Did not take ozempic x 2months due to fatigue and anorexia Check hgbA1c-9.5%, normal renal function Advised to resume ozempic and maintain small frequent meals F/up in 3months      Relevant Medications   atorvastatin (LIPITOR) 40 MG tablet   Other Relevant Orders   Hemoglobin A1c (Completed)   Essential (primary) hypertension   Hold losartan/hydrochlorothiazide due to hypotension BP Readings from Last 3 Encounters:  02/20/23 (!) 80/60  11/11/22 131/78  10/31/22 131/74    Check cbc, tsh, cmp Advised to maintain adequate oral hydration, change positions slowly Monitor BP at home daily She is to take losartan/hydrochlorothiazide only if BP>120/70 F/up in 2weeks      Relevant Medications   atorvastatin (LIPITOR) 40 MG tablet   Multiple sclerosis (HCC)   Current use of Gilenya Under the care of Dr. Epimenio Foot      Relevant Medications   Fingolimod HCl (GILENYA) 0.5 MG CAPS   Other Visit Diagnoses       Orthostatic hypotension    -  Primary   Relevant Medications   atorvastatin (LIPITOR) 40 MG tablet   Other Relevant Orders   Comprehensive metabolic panel (Completed)   CBC with Differential/Platelet (Completed)   TSH (Completed)   CK (Completed)   Urinalysis w microscopic + reflex cultur     Hypokalemia       Relevant Medications   potassium chloride SA (KLOR-CON M) 20 MEQ tablet      Meds ordered this encounter  Medications   potassium chloride SA (KLOR-CON M) 20 MEQ tablet    Sig: Take 2 tablets (40 mEq total) by mouth daily.    Dispense:  14 tablet    Refill:  0    Supervising Provider:   Mliss Sax [[id number]]   Return in about 2 weeks (around 03/06/2023) for fatigue and anorexia.    Alysia Penna, NP

## 2023-02-20 NOTE — Telephone Encounter (Signed)
Pharmacy Patient Advocate Encounter  Received notification from [facility] that Prior Authorization for Dextroamphetamine Sulfate ER 10MG  er capsules has been APPROVED from 02/20/2023 to 02/20/2024. Ran test claim, Copay is $5.00. This test claim was processed through [facility]- copay amounts may vary at other pharmacies due to pharmacy/plan contracts, or as the patient moves through the different stages of their insurance plan.   PA #/Case ID/Reference #: PA Case ID #: [id number]  Key: Barry Dienes

## 2023-02-20 NOTE — Telephone Encounter (Signed)
Pt sent mychart stating PA Dextroamphetine needed

## 2023-02-20 NOTE — Patient Instructions (Signed)
Do not take losartan/hydrochlorothiazide if BP <120/70. Hold ozempic. Check glucose and BP daily in AM. Send readings via mychart in 1week Maintain 64oz of water daily and small frequent meals. Use walker/cane to prevent falls. Go to ED if symptoms worsen. Go to lab

## 2023-02-20 NOTE — Assessment & Plan Note (Addendum)
Hold losartan/hydrochlorothiazide due to hypotension BP Readings from Last 3 Encounters:  02/20/23 (!) 80/60  11/11/22 131/78  10/31/22 131/74    Check cbc, tsh, cmp Advised to maintain adequate oral hydration, change positions slowly Monitor BP at home daily She is to take losartan/hydrochlorothiazide only if BP>120/70 F/up in 2weeks

## 2023-02-20 NOTE — Addendum Note (Signed)
Addended by: Michaela Corner on: 02/20/2023 02:27 PM   Modules accepted: Orders

## 2023-02-20 NOTE — Assessment & Plan Note (Addendum)
Current use of Gilenya Under the care of Dr. Epimenio Foot

## 2023-02-20 NOTE — Telephone Encounter (Signed)
Sent pt mychart letting her know about approval

## 2023-02-21 ENCOUNTER — Other Ambulatory Visit (HOSPITAL_COMMUNITY): Payer: Self-pay

## 2023-02-22 ENCOUNTER — Encounter: Payer: Self-pay | Admitting: Nurse Practitioner

## 2023-02-22 DIAGNOSIS — I951 Orthostatic hypotension: Secondary | ICD-10-CM | POA: Diagnosis not present

## 2023-02-25 LAB — URINALYSIS W MICROSCOPIC + REFLEX CULTURE
Bilirubin Urine: NEGATIVE
Glucose, UA: NEGATIVE
Hgb urine dipstick: NEGATIVE
Ketones, ur: NEGATIVE
Nitrites, Initial: NEGATIVE
Specific Gravity, Urine: 1.013 (ref 1.001–1.035)
Squamous Epithelial / HPF: NONE SEEN /[HPF] (ref <=5)
pH: >=8.5 — AB (ref 5.0–8.0)

## 2023-02-25 LAB — URINE CULTURE
MICRO NUMBER:: 16016343
SPECIMEN QUALITY:: ADEQUATE

## 2023-02-25 LAB — CULTURE INDICATED

## 2023-02-27 MED ORDER — NITROFURANTOIN MONOHYD MACRO 100 MG PO CAPS: 100.0000 mg | ORAL_CAPSULE | Freq: Two times a day (BID) | ORAL | 0 refills | Status: AC

## 2023-02-27 NOTE — Addendum Note (Signed)
Addended by: Michaela Corner on: 02/27/2023 08:17 AM   Modules accepted: Orders

## 2023-03-02 ENCOUNTER — Ambulatory Visit: Payer: Federal, State, Local not specified - PPO | Admitting: Gastroenterology

## 2023-03-02 ENCOUNTER — Encounter: Payer: Self-pay | Admitting: Gastroenterology

## 2023-03-02 VITALS — BP 122/64 | HR 89 | Ht 66.0 in | Wt 196.2 lb

## 2023-03-02 DIAGNOSIS — R197 Diarrhea, unspecified: Secondary | ICD-10-CM | POA: Diagnosis not present

## 2023-03-02 DIAGNOSIS — R112 Nausea with vomiting, unspecified: Secondary | ICD-10-CM | POA: Diagnosis not present

## 2023-03-02 NOTE — Progress Notes (Signed)
 03/02/2023 Cathy [name] [id number] [birth year]   HISTORY OF PRESENT ILLNESS: This is a 58 year old female who is new to our office.  She has been referred here by Roselie Bishop Mood, NP, for evaluation of diarrhea.  The patient is here today with her husband.  They tell me that she has been having issues with recurrent nausea/vomiting and diarrhea since probably October.  She has had some stool studies in the form of culture and C. difficile that have been negative.  It looks like she had a positive H. pylori breath test and that was later followed by a stool antigen that was then negative.  They tell me that she had a colonoscopy in [place] probably about 10 years ago in 2015.  She had a negative Cologuard in February 2024.  She states that the symptoms tend to come and go.  Right now she is doing well, but last weekend she was in the bed and not feeling well.  Not having any energy.  Even between episodes her bowel movements are more normal, but she says she still does not have regular bowel movements.  She denies abdominal pain.  She is on Ozempic  for about the past year.  She says that she stopped that for about a month at one point and did not really notice any difference in her symptoms.  No rectal bleeding.   Past Medical History:  Diagnosis Date   Anemia    Asthma    Diabetes mellitus without complication (HCC)    Eczema    Elevated cholesterol    Hay fever 08/03/2009   Overview:   Overview:   Qualifier: Diagnosis of   By: Antonio ROSALEA Rockers      Hypertension    Multiple sclerosis (HCC)    Obesity    Vision abnormalities    Past Surgical History:  Procedure Laterality Date   ABDOMINAL HYSTERECTOMY     CARPAL TUNNEL RELEASE     CERVICAL SPINE SURGERY     Dr. Royden Schneider   LASER ABLATION     TENDON REPAIR Left    wrist surgery   TUBAL LIGATION      reports that she has never smoked. She has never used smokeless tobacco. She reports current alcohol use of about  1.0 standard drink of alcohol per week. She reports that she does not use drugs. family history includes COPD in her father; Diabetes type II in her mother; Diverticulitis in her mother; Heart disease in her brother; Hypertension in her mother; Multiple sclerosis in her paternal grandfather and paternal uncle; Pancreatic cancer in her sister; Prostate cancer in her father; Stroke in her mother. Allergies  Allergen Reactions   Aspirin     Heartburn   Ibuprofen     heatburn      Outpatient Encounter Medications as of 03/02/2023  Medication Sig   albuterol (PROVENTIL) (2.5 MG/3ML) 0.083% nebulizer solution Inhale into the lungs.   atorvastatin  (LIPITOR) 40 MG tablet Take by mouth.   Cholecalciferol (VITAMIN D ) 2000 UNITS CAPS Take 2,000 Units by mouth daily.   dextroamphetamine  (DEXEDRINE ) 10 MG 24 hr capsule Take 4 po qD   diclofenac  Sodium (VOLTAREN ) 1 % GEL Apply one inch to lower back once a day   glucose blood (ACCU-CHEK GUIDE TEST) test strip Check blood sugars daily and as needed   loratadine (CLARITIN) 10 MG tablet Take 10 mg by mouth daily.   losartan -hydrochlorothiazide (HYZAAR) 50-12.5 MG tablet Take 1 tablet by  mouth once daily   nitrofurantoin , macrocrystal-monohydrate, (MACROBID ) 100 MG capsule Take 1 capsule (100 mg total) by mouth 2 (two) times daily for 7 days.   pantoprazole  (PROTONIX ) 40 MG tablet Take 1 tablet (40 mg total) by mouth daily.   potassium chloride  SA (KLOR-CON  M) 20 MEQ tablet Take 2 tablets (40 mEq total) by mouth daily.   rosuvastatin  (CRESTOR ) 40 MG tablet Take 1 tablet (40 mg total) by mouth daily.   Semaglutide , 1 MG/DOSE, (OZEMPIC , 1 MG/DOSE,) 4 MG/3ML SOPN Inject 1 mg into the skin once a week.   ZEPOSIA  0.92 MG CAPS TAKE 1 CAPSULE BY MOUTH 1 TIME A DAY   Fingolimod  HCl (GILENYA ) 0.5 MG CAPS Take by mouth. (Patient not taking: Reported on 03/02/2023)   [DISCONTINUED] gabapentin  (NEURONTIN ) 300 MG capsule Take 1 capsule (300 mg total) by mouth 3 (three)  times daily. (Patient not taking: Reported on 03/02/2023)   [DISCONTINUED] melatonin 1 MG TABS tablet Take by mouth. (Patient not taking: Reported on 03/02/2023)   No facility-administered encounter medications on file as of 03/02/2023.    REVIEW OF SYSTEMS  : All other systems reviewed and negative except where noted in the History of Present Illness.   PHYSICAL EXAM: BP 122/64 (BP Location: Left Arm, Patient Position: Sitting, Cuff Size: Normal)   Pulse 89   Ht 5' 6 (1.676 m)   Wt 196 lb 4 oz (89 kg)   BMI 31.68 kg/m  General: Well developed female in no acute distress Head: Normocephalic and atraumatic Eyes:  Sclerae anicteric, conjunctiva pink. Ears: Normal auditory acuity  Lungs: Clear throughout to auscultation; no W/R/R. Heart: Regular rate and rhythm; no M/R/G. Rectal: Will be done at the time of colonoscopy. Musculoskeletal: Symmetrical with no gross deformities  Skin: No lesions on visible extremities Neurological: Alert oriented x 4, grossly non-focal Psychological:  Alert and cooperative. Normal mood and affect  ASSESSMENT AND PLAN: *58 year old female with ongoing/recurrent complaints of nausea, vomiting, diarrhea.  Stool studies negative for infectious source.  Looks like she had a positive breath test for H. pylori and then a stool test later that was negative so it looks like that was probably adequately treated.  She had a colonoscopy about 10 years ago in [facility], but had a negative Cologuard a year ago.  Will plan for both EGD and colonoscopy to evaluate symptoms.  These will be scheduled with Dr. Avram.  Question if symptoms could be due to Ozempic .  She has not been on that for probably about a year.  Did stop it for about a month at 1 point, but probably would need to stop it for longer than that, maybe 2 to 3 months to see if symptoms improve/resolve.   CC:  Nche, Roselie Rockford, NP

## 2023-03-02 NOTE — Patient Instructions (Signed)
 You have been scheduled for an endoscopy and colonoscopy. Please follow the written instructions given to you at your visit today.  If you use inhalers (even only as needed), please bring them with you on the day of your procedure.  DO NOT TAKE 7 DAYS PRIOR TO TEST- Trulicity (dulaglutide) Ozempic , Wegovy (semaglutide ) Mounjaro (tirzepatide) Bydureon Bcise (exanatide extended release)  DO NOT TAKE 1 DAY PRIOR TO YOUR TEST Rybelsus (semaglutide ) Adlyxin (lixisenatide) Victoza (liraglutide) Byetta (exanatide) ___________________________________________________________________________  Rosine will receive your bowel preparation through [facility], which ensures the lowest copay and home delivery, with outreach via text or call from an [phone] number. Please respond promptly to avoid rescheduling of your procedure. If you are interested in alternative options or have any questions regarding your prep, please contact them at [phone] ____________________________________________________________________________  Your Provider Has Sent Your Bowel Prep Regimen To [facility]   [facility] will contact you to verify your information and collect your copay, if applicable. Enjoy the comfort of your home while your prescription is mailed to you, FREE of any shipping charges.   [facility] accepts all major insurance benefits and applies discounts & coupons.  Have additional questions?   Chat: [email or web address] Call: [phone] Email: [email or web address] NCPDP: [id number]  How will [facility] contact you?  With a Welcome phone call,  a Welcome text and a checkout link in text form.  Texts you receive from [phone] Are NOT Spam.  *To set up delivery, you must complete the checkout process via link or speak to one of the patient care representatives. If [facility] is unable to reach you, your prescription may be delayed.  To avoid long hold times on the phone, you may also  utilize the secure chat feature on the [facility] website to request that they call you back for transaction completion or to expedite your concerns.  _______________________________________________________  If your blood pressure at your visit was 140/90 or greater, please contact your primary care physician to follow up on this.  _______________________________________________________  If you are age 72 or older, your body mass index should be between 23-30. Your Body mass index is 31.68 kg/m. If this is out of the aforementioned range listed, please consider follow up with your Primary Care Provider.  If you are age 22 or younger, your body mass index should be between 19-25. Your Body mass index is 31.68 kg/m. If this is out of the aformentioned range listed, please consider follow up with your Primary Care Provider.   ________________________________________________________  The [facility] providers would like to encourage you to use MYCHART to communicate with providers for non-urgent requests or questions.  Due to long hold times on the telephone, sending your provider a message by [facility] may be a faster and more efficient way to get a response.  Please allow 48 business hours for a response.  Please remember that this is for non-urgent requests.  _______________________________________________________

## 2023-03-07 ENCOUNTER — Ambulatory Visit (INDEPENDENT_AMBULATORY_CARE_PROVIDER_SITE_OTHER): Payer: Federal, State, Local not specified - PPO | Admitting: Nurse Practitioner

## 2023-03-07 ENCOUNTER — Other Ambulatory Visit: Payer: Self-pay | Admitting: Neurology

## 2023-03-07 ENCOUNTER — Encounter: Payer: Self-pay | Admitting: Nurse Practitioner

## 2023-03-07 ENCOUNTER — Encounter: Payer: Self-pay | Admitting: Neurology

## 2023-03-07 VITALS — BP 130/78 | HR 93 | Temp 98.6°F | Resp 18 | Wt 198.6 lb

## 2023-03-07 DIAGNOSIS — E876 Hypokalemia: Secondary | ICD-10-CM

## 2023-03-07 DIAGNOSIS — I10 Essential (primary) hypertension: Secondary | ICD-10-CM

## 2023-03-07 DIAGNOSIS — E1169 Type 2 diabetes mellitus with other specified complication: Secondary | ICD-10-CM | POA: Diagnosis not present

## 2023-03-07 LAB — BASIC METABOLIC PANEL
BUN: 10 mg/dL (ref 6–23)
CO2: 32 meq/L (ref 19–32)
Calcium: 9.7 mg/dL (ref 8.4–10.5)
Chloride: 100 meq/L (ref 96–112)
Creatinine, Ser: 0.68 mg/dL (ref 0.40–1.20)
GFR: 96.78 mL/min (ref >60.00)
Glucose, Bld: 131 mg/dL — ABNORMAL HIGH (ref 70–99)
Potassium: 3.6 meq/L (ref 3.5–5.1)
Sodium: 139 meq/L (ref 135–145)

## 2023-03-07 MED ORDER — DEXTROAMPHETAMINE SULFATE ER 10 MG PO CP24: ORAL_CAPSULE | ORAL | 0 refills | Status: DC

## 2023-03-07 NOTE — Assessment & Plan Note (Signed)
Home fasting glucose: 180-291 No worsening GI symptoms with ozempic 1mg  Discussed importance of heart healthy meals and snacks. Advised to maintain at least 2 balanced meals daily.  Maintain current med dose F/up in 3months

## 2023-03-07 NOTE — Progress Notes (Unsigned)
Established Patient Visit  Patient: Cathy [name]   DOB: 08-14-1965   58 y.o. Female  MRN: [id number] Visit Date: 03/07/2023  Subjective:    Chief Complaint  Patient presents with   Medical Managment of Chronic Issues     2 week follow up fatigue and anorexia; referral request to Endocrinologist    HPI  Accompanied by Husband.  Essential (primary) hypertension Improved BP, fatigue, and anorexia. No Home BP check. BP Readings from Last 3 Encounters:  03/07/23 130/78  03/02/23 122/64  02/20/23 (!) 80/60    Repeat BMP due tp previous hypokalemia Advised to monitor BP at home in AM Provided parameter on when to take or hold losartan/hydrochlorothiazide F/up in 3months  DM (diabetes mellitus) (HCC) Home fasting glucose: 180-291 No worsening GI symptoms with ozempic 1mg  Discussed importance of heart healthy meals and snacks. Advised to maintain at least 2 balanced meals daily.  Maintain current med dose F/up in 3months  Reviewed medical, surgical, and social history today  Medications: Outpatient Medications Prior to Visit  Medication Sig Note   albuterol (PROVENTIL) (2.5 MG/3ML) 0.083% nebulizer solution Inhale into the lungs.    atorvastatin (LIPITOR) 40 MG tablet Take by mouth.    Cholecalciferol (VITAMIN D) 2000 UNITS CAPS Take 2,000 Units by mouth daily.    dextroamphetamine (DEXEDRINE) 10 MG 24 hr capsule Take 4 po qD    diclofenac Sodium (VOLTAREN) 1 % GEL Apply one inch to lower back once a day    Fingolimod HCl (GILENYA) 0.5 MG CAPS Take by mouth.    glucose blood (ACCU-CHEK GUIDE TEST) test strip Check blood sugars daily and as needed    loratadine (CLARITIN) 10 MG tablet Take 10 mg by mouth daily.    losartan-hydrochlorothiazide (HYZAAR) 50-12.5 MG tablet Take 1 tablet by mouth once daily 03/07/2023: On hold    pantoprazole (PROTONIX) 40 MG tablet Take 1 tablet (40 mg total) by mouth daily.    potassium chloride SA (KLOR-CON M) 20  MEQ tablet Take 2 tablets (40 mEq total) by mouth daily.    rosuvastatin (CRESTOR) 40 MG tablet Take 1 tablet (40 mg total) by mouth daily.    Semaglutide, 1 MG/DOSE, (OZEMPIC, 1 MG/DOSE,) 4 MG/3ML SOPN Inject 1 mg into the skin once a week.    ZEPOSIA 0.92 MG CAPS TAKE 1 CAPSULE BY MOUTH 1 TIME A DAY    No facility-administered medications prior to visit.   Reviewed past medical and social history.   ROS per HPI above  {Show previous labs (optional):23779}    Objective:  BP 130/78 (BP Location: Left Arm, Patient Position: Sitting, Cuff Size: Normal)   Pulse 93   Temp 98.6 F (37 C) (Temporal)   Resp 18   Wt 198 lb 9.6 oz (90.1 kg)   SpO2 100%   BMI 32.05 kg/m      Physical Exam Vitals and nursing note reviewed.  Cardiovascular:     Rate and Rhythm: Normal rate and regular rhythm.     Pulses: Normal pulses.     Heart sounds: Normal heart sounds.  Pulmonary:     Effort: Pulmonary effort is normal.     Breath sounds: Normal breath sounds.  Neurological:     Mental Status: She is alert and oriented to person, place, and time.     No results found for any visits on 03/07/23.    Assessment & Plan:  Problem List Items Addressed This Visit     DM (diabetes mellitus) (HCC)   Home fasting glucose: 180-291 No worsening GI symptoms with ozempic 1mg  Discussed importance of heart healthy meals and snacks. Advised to maintain at least 2 balanced meals daily.  Maintain current med dose F/up in 3months      Essential (primary) hypertension   Improved BP, fatigue, and anorexia. No Home BP check. BP Readings from Last 3 Encounters:  03/07/23 130/78  03/02/23 122/64  02/20/23 (!) 80/60    Repeat BMP due tp previous hypokalemia Advised to monitor BP at home in AM Provided parameter on when to take or hold losartan/hydrochlorothiazide F/up in 3months      Relevant Orders   Basic metabolic panel   Other Visit Diagnoses       Hypokalemia    -  Primary   Relevant  Orders   Basic metabolic panel      Return in about 3 months (around 06/04/2023) for HTN, DM, hyperlipidemia (fasting).     Alysia Penna, NP

## 2023-03-07 NOTE — Assessment & Plan Note (Signed)
Improved BP, fatigue, and anorexia. No Home BP check. BP Readings from Last 3 Encounters:  03/07/23 130/78  03/02/23 122/64  02/20/23 (!) 80/60    Repeat BMP due tp previous hypokalemia Advised to monitor BP at home in AM Provided parameter on when to take or hold losartan/hydrochlorothiazide F/up in 3months

## 2023-03-07 NOTE — Telephone Encounter (Signed)
Pt is asking if the remainder of her Rx of the medication can be called into the [facility] [phone], she has confirmed they have it, please advise.

## 2023-03-07 NOTE — Patient Instructions (Signed)
Go to lab Check BP and glucose daily in AM Take BP med if BP >120/70 Bring readings to next appointment.

## 2023-03-08 ENCOUNTER — Other Ambulatory Visit: Payer: Self-pay | Admitting: *Deleted

## 2023-03-08 ENCOUNTER — Other Ambulatory Visit: Payer: Self-pay

## 2023-03-08 ENCOUNTER — Encounter: Payer: Self-pay | Admitting: Internal Medicine

## 2023-03-08 ENCOUNTER — Encounter: Payer: Self-pay | Admitting: Nurse Practitioner

## 2023-03-08 NOTE — Telephone Encounter (Signed)
[facility] and they don't have pt's Dextroamphetamine 10mg  out of stock. Pt is needing refll sent to [facility]  Pt Last Seen 09/19/2022 (Dr. Epimenio Foot) Upcoming Appointment 04/06/2023 (Amy Lomax)  Dextroamphetamine 10mg  Last Filled 02/20/2023 60 Tablets (15 day Supply)

## 2023-03-09 NOTE — Telephone Encounter (Signed)
Pt filled qty 120 at [facility] on 03/07/23

## 2023-03-13 ENCOUNTER — Telehealth: Payer: Self-pay

## 2023-03-13 NOTE — Telephone Encounter (Signed)
 Patient was identified as falling into the [place] Measure - Diabetes.   Patient was: Appointment scheduled for lab or office visit for A1c.

## 2023-03-16 ENCOUNTER — Ambulatory Visit: Payer: Federal, State, Local not specified - PPO | Admitting: Internal Medicine

## 2023-03-16 ENCOUNTER — Encounter: Payer: Self-pay | Admitting: Internal Medicine

## 2023-03-16 VITALS — BP 167/81 | HR 87 | Temp 98.0°F | Resp 10 | Ht 66.0 in | Wt 196.0 lb

## 2023-03-16 DIAGNOSIS — K3189 Other diseases of stomach and duodenum: Secondary | ICD-10-CM | POA: Diagnosis not present

## 2023-03-16 DIAGNOSIS — R197 Diarrhea, unspecified: Secondary | ICD-10-CM | POA: Diagnosis not present

## 2023-03-16 DIAGNOSIS — K2950 Unspecified chronic gastritis without bleeding: Secondary | ICD-10-CM | POA: Diagnosis not present

## 2023-03-16 DIAGNOSIS — K648 Other hemorrhoids: Secondary | ICD-10-CM | POA: Diagnosis not present

## 2023-03-16 DIAGNOSIS — R112 Nausea with vomiting, unspecified: Secondary | ICD-10-CM

## 2023-03-16 MED ORDER — SODIUM CHLORIDE 0.9 % IV SOLN: 500.0000 mL | Freq: Once | INTRAVENOUS | Status: DC

## 2023-03-16 NOTE — Patient Instructions (Addendum)
 Handout provided about hemorrhoids.  Resume previous diet.  Continue present medications.  Await pathology results.  YOU HAD AN ENDOSCOPIC PROCEDURE TODAY AT THE [facility]:   Refer to the procedure report that was given to you for any specific questions about what was found during the examination.  If the procedure report does not answer your questions, please call your gastroenterologist to clarify.  If you requested that your care partner not be given the details of your procedure findings, then the procedure report has been included in a sealed envelope for you to review at your convenience later.  YOU SHOULD EXPECT: Some feelings of bloating in the abdomen. Passage of more gas than usual.  Walking can help get rid of the air that was put into your GI tract during the procedure and reduce the bloating. If you had a lower endoscopy (such as a colonoscopy or flexible sigmoidoscopy) you may notice spotting of blood in your stool or on the toilet paper. If you underwent a bowel prep for your procedure, you may not have a normal bowel movement for a few days.  Please Note:  You might notice some irritation and congestion in your nose or some drainage.  This is from the oxygen used during your procedure.  There is no need for concern and it should clear up in a day or so.  SYMPTOMS TO REPORT IMMEDIATELY:  Following lower endoscopy (colonoscopy or flexible sigmoidoscopy):  Excessive amounts of blood in the stool  Significant tenderness or worsening of abdominal pains  Swelling of the abdomen that is new, acute  Fever of 100F or higher  Following upper endoscopy (EGD)  Vomiting of blood or coffee ground material  New chest pain or pain under the shoulder blades  Painful or persistently difficult swallowing  New shortness of breath  Fever of 100F or higher  Black, tarry-looking stools  For urgent or emergent issues, a gastroenterologist can be reached at any hour by  calling [phone]. Do not use MyChart messaging for urgent concerns.    DIET:  We do recommend a small meal at first, but then you may proceed to your regular diet.  Drink plenty of fluids but you should avoid alcoholic beverages for 24 hours.  ACTIVITY:  You should plan to take it easy for the rest of today and you should NOT DRIVE or use heavy machinery until tomorrow (because of the sedation medicines used during the test).    FOLLOW UP: Our staff will call the number listed on your records the next business day following your procedure.  We will call around 7:15- 8:00 am to check on you and address any questions or concerns that you may have regarding the information given to you following your procedure. If we do not reach you, we will leave a message.     If any biopsies were taken you will be contacted by phone or by letter within the next 1-3 weeks.  Please call us at [phone] if you have not heard about the biopsies in 3 weeks.    SIGNATURES/CONFIDENTIALITY: You and/or your care partner have signed paperwork which will be entered into your electronic medical record.  These signatures attest to the fact that that the information above on your After Visit Summary has been reviewed and is understood.  Full responsibility of the confidentiality of this discharge information lies with you and/or your care-partner.The stomach was somewhat red in areas - that might not be a problem  at all but I checked it with biopsies.   Colonoscopy was normal but I also took biopsies to see if there is underlying inflammation causing diarrhea.  I will let you know results and recommendations.  I appreciate the opportunity to care for you. Iva Boop, MD, [facility]

## 2023-03-16 NOTE — Op Note (Signed)
 [facility] Patient Name: Cathy [name] Procedure Date: 03/16/2023 2:58 PM MRN: [id number] Endoscopist: Iva Boop , MD, [id number] Age: 58 Referring MD:  Date of Birth: [birth year] Gender: Female Account #: [id number] Procedure:                Upper GI endoscopy Indications:              Nausea with vomiting Medicines:                Monitored Anesthesia Care Procedure:                Pre-Anesthesia Assessment:                           - Prior to the procedure, a History and Physical                            was performed, and patient medications and                            allergies were reviewed. The patient's tolerance of                            previous anesthesia was also reviewed. The risks                            and benefits of the procedure and the sedation                            options and risks were discussed with the patient.                            All questions were answered, and informed consent                            was obtained. Prior Anticoagulants: The patient has                            taken no anticoagulant or antiplatelet agents. ASA                            Grade Assessment: II - A patient with mild systemic                            disease. After reviewing the risks and benefits,                            the patient was deemed in satisfactory condition to                            undergo the procedure.                           After obtaining informed consent, the endoscope was  passed under direct vision. Throughout the                            procedure, the patient's blood pressure, pulse, and                            oxygen saturations were monitored continuously. The                            Olympus Scope [id number] was introduced through the                            mouth, and advanced to the second part of duodenum.                            The upper GI endoscopy  was accomplished without                            difficulty. The patient tolerated the procedure                            well. Scope In: Scope Out: Findings:                 Patchy mildly erythematous mucosa without bleeding                            was found in the gastric antrum. Biopsies were                            taken with a cold forceps for histology.                            Verification of patient identification for the                            specimen was done. Estimated blood loss was minimal.                           The exam was otherwise without abnormality.                           The cardia and gastric fundus were normal on                            retroflexion. Complications:            No immediate complications. Estimated Blood Loss:     Estimated blood loss was minimal. Impression:               - Erythematous mucosa in the antrum. Biopsied.                           - The examination was otherwise normal. She did say  symptoms were subsiding Recommendation:           - Patient has a contact number available for                            emergencies. The signs and symptoms of potential                            delayed complications were discussed with the                            patient. Return to normal activities tomorrow.                            Written discharge instructions were provided to the                            patient.                           - Resume previous diet.                           - Continue present medications.                           - Await pathology results.                           - See the other procedure note for documentation of                            additional recommendations. Iva Boop, MD 03/16/2023 3:58:23 PM This report has been signed electronically.

## 2023-03-16 NOTE — Op Note (Signed)
 [facility] Patient Name: Cathy [name] Procedure Date: 03/16/2023 2:57 PM MRN: [id number] Endoscopist: Iva Boop , MD, [id number] Age: 58 Referring MD:  Date of Birth: 05/30/1965 Gender: Female Account #: [id number] Procedure:                Colonoscopy Indications:              Clinically significant diarrhea of unexplained                            origin Medicines:                Monitored Anesthesia Care Procedure:                Pre-Anesthesia Assessment:                           - Prior to the procedure, a History and Physical                            was performed, and patient medications and                            allergies were reviewed. The patient's tolerance of                            previous anesthesia was also reviewed. The risks                            and benefits of the procedure and the sedation                            options and risks were discussed with the patient.                            All questions were answered, and informed consent                            was obtained. Prior Anticoagulants: The patient has                            taken no anticoagulant or antiplatelet agents. ASA                            Grade Assessment: II - A patient with mild systemic                            disease. After reviewing the risks and benefits,                            the patient was deemed in satisfactory condition to                            undergo the procedure.  After obtaining informed consent, the colonoscope                            was passed under direct vision. Throughout the                            procedure, the patient's blood pressure, pulse, and                            oxygen saturations were monitored continuously. The                            CF [id number] #[id number] was introduced through the anus                            and advanced to the the terminal ileum, with                             identification of the appendiceal orifice and IC                            valve. The colonoscopy was performed without                            difficulty. The patient tolerated the procedure                            well. The quality of the bowel preparation was                            good. The ileocecal valve, appendiceal orifice, and                            rectum were photographed. The bowel preparation                            used was Miralax via split dose instruction. Scope In: 3:34:49 PM Scope Out: 3:51:12 PM Scope Withdrawal Time: 0 hours 10 minutes 42 seconds  Total Procedure Duration: 0 hours 16 minutes 23 seconds  Findings:                 The perianal and digital rectal examinations were                            normal.                           The terminal ileum appeared normal.                           The entire examined colon appeared normal on direct                            and retroflexion views except for internal  hemorrhoids.                           Biopsies for histology were taken with a cold                            forceps from the ascending colon, transverse colon,                            descending colon and sigmoid colon for evaluation                            of microscopic colitis. Complications:            No immediate complications. Estimated Blood Loss:     Estimated blood loss was minimal. Impression:               - The examined portion of the ileum was normal.                           - The entire examined colon is normal on direct and                            retroflexion views except for internal hemorrhoids.                           - Biopsies were taken with a cold forceps from the                            ascending colon, transverse colon, descending colon                            and sigmoid colon for evaluation of microscopic                             colitis. Recommendation:           - Patient has a contact number available for                            emergencies. The signs and symptoms of potential                            delayed complications were discussed with the                            patient. Return to normal activities tomorrow.                            Written discharge instructions were provided to the                            patient.                           - Resume previous diet.                           -  Continue present medications.                           - Await pathology results. Iva Boop, MD 03/16/2023 4:01:47 PM This report has been signed electronically.

## 2023-03-16 NOTE — Progress Notes (Unsigned)
 History and Physical Interval Note:  03/16/2023 3:22 PM  Cathy [name]  has presented today for endoscopic procedure(s), with the diagnosis of  Encounter Diagnoses  Name Primary?   Nausea and vomiting, unspecified vomiting type Yes   Diarrhea, unspecified type   .  The various methods of evaluation and treatment have been discussed with the patient and/or family. After consideration of risks, benefits and other options for treatment, the patient has consented to  the endoscopic procedure(s).   The patient's history has been reviewed, patient examined, no change in status, stable for endoscopic procedure(s).  I have reviewed the patient's chart and labs.  Questions were answered to the patient's satisfaction.     Iva Boop, MD, [facility]

## 2023-03-16 NOTE — Progress Notes (Signed)
 Called to room to assist during endoscopic procedure.  Patient ID and intended procedure confirmed with present staff. Received instructions for my participation in the procedure from the performing physician.

## 2023-03-16 NOTE — Progress Notes (Deleted)
 History and Physical Interval Note:  03/16/2023 2:51 PM  Cathy [name]  has presented today for endoscopic procedure(s), with the diagnosis of No diagnosis found..  The various methods of evaluation and treatment have been discussed with the patient and/or family. After consideration of risks, benefits and other options for treatment, the patient has consented to  the endoscopic procedure(s).   The patient's history has been reviewed, patient examined, no change in status, stable for endoscopic procedure(s).  I have reviewed the patient's chart and labs.  Questions were answered to the patient's satisfaction.     Iva Boop, MD, [facility]

## 2023-03-16 NOTE — Progress Notes (Unsigned)
 Report to PACU, RN, vss, BBS= Clear.

## 2023-03-17 ENCOUNTER — Telehealth: Payer: Self-pay

## 2023-03-17 NOTE — Telephone Encounter (Signed)
  Follow up Call-     03/16/2023    2:57 PM  Call back number  Post procedure Call Back phone  # [phone], spouse number  Permission to leave phone message Yes     Patient questions:  Do you have a fever, pain , or abdominal swelling? No. Pain Score  0 *  Have you tolerated food without any problems? Yes.    Have you been able to return to your normal activities? Yes.    Do you have any questions about your discharge instructions: Diet   No. Medications  No. Follow up visit  No.  Do you have questions or concerns about your Care? No.  Actions: * If pain score is 4 or above: No action needed, pain <4.

## 2023-03-21 LAB — SURGICAL PATHOLOGY

## 2023-03-27 ENCOUNTER — Telehealth: Payer: Self-pay

## 2023-03-27 NOTE — Telephone Encounter (Signed)
 Patient was identified as falling into the [place] Measure - Diabetes.   Patient was: Appointment scheduled for lab or office visit for A1c.

## 2023-03-31 ENCOUNTER — Telehealth: Payer: Self-pay | Admitting: Internal Medicine

## 2023-03-31 DIAGNOSIS — R197 Diarrhea, unspecified: Secondary | ICD-10-CM

## 2023-03-31 NOTE — Telephone Encounter (Signed)
 Called about diarrhea issues  Have not improved w/ weeks off Ozempic No associated med changes  No constipation at all - may miss 1 day of BM but not longer (so I doubt mixed IBS)  I have asked her to try loperamide prn and to keep a diary of when she has diarrhea and to also see if it correlates w/ food  Please schedule a next available f/u me  Encounter Diagnosis  Name Primary?   Diarrhea, unspecified type Yes

## 2023-03-31 NOTE — Telephone Encounter (Signed)
 She set up an appointment for late May.

## 2023-04-05 NOTE — Progress Notes (Unsigned)
 PATIENT: Cathy [name] DOB: 02/11/1965  REASON FOR VISIT: follow up HISTORY FROM: patient  No chief complaint on file.    HISTORY OF PRESENT ILLNESS:  04/05/23 ALL: Cathy [name] is a 58 y.o. female here today for follow up RRMS. She continues Zeposia. Last MRI brain and cervical spine stable 03/2020.   She feels that she is doing well, overall. She feels MS is stable. No new weakness or numbness.   She is having more thoracic and low back pain. This seems to wax and wane but has been worse for her over the past two months. It is a dull achy pain. No radicular symptoms. MRI 02/2022 showed degenerative changes but no nerve root compression. She feels Tylenol helps most. She continues gabapentin 300mg  at bedtime for sleep but does not seem to help back pain. PT has helped in past.   Headaches are well managed. She no longer takes imipramine.   She continues dextroamphetamine ??mg daily for fatigue and feels it helps. She does have off and on insomnia but overall feels that she is sleeping well. She reports getting about 4-5 hours of sleep most nights.   Mood is stable.  HISTORY: (copied from Dr Bonnita Hollow previous note)  Cathy [name] is a 58 y.o. woman with MS andgait issues.   Update 09/19/2022: She switched to Zeposia due to high co-py (ws Gilenya) and is tolerating it well. .   She has no exacerbations recently. Brain MRi 2/4/224 was stable.    MRI cervical  spine 02/27/2022 shows several older foci bit none new since prior MRI    She is reporting more axial lower back pain without radiation.  MRI 02/2022 showed DJD with facet hypertrophy and bulges but no nerve root compression at L3-L4 through L5-S1.   Back pain is worse on the right.  She notes increased pain with sustained standing or walking.   Better if sitting..    She is on gabapentin and Tylenol without much be   No exacerbation pr new MS symptom.   Gait is about the same with a mild limp.   No  falls but rare stumbles and se needs to be areful on stairs.    She notes mild right leg weakness.   She has some right leg tingling at times.      Bladder functon is fine.   Some constipation.    She notes no new vision issues and needs reading glasses.   Colors are dulled OD        Dextroamphetamine helps hr fatigue.  She takes 4/day with a lot of benefit fr ADD and fatigue.    She has insomnia about 1/3 the nights.   Does not know if she snores    She notes reduced STM and attention/focus.   Also mild verbal processing issues.   She is on dextroamphetamine 10 mg 4 pills in the morning -  helps her attention and fatigue.     Mood is doing well for the most part.      She has lost 20 pounds with Ozempic for DM   MS History:   She was diagnosed with MS after presenting with optic neuritis x 2 in 2009.   She had an MRI and LP and was diagnosed with MS.     She started on Copaxone and switched to Gilenya in 2013.    She denies any further exacerbation since the optic neuritis but was told she had MS plaques on  her spine when she presented last year with a cervical radiculopathy requiring surgery.    I have seen her the past year at [facility].   Her last MRI's were Jan 21, 2014.  MRI images show foci in the cerebellum and the hemispheres consistent with MS. There were no acute findings. She has NIDDM, HTN and high cholesterol.   Imaging: MRI cervical spine 06/27/18 showed several T2 hyperintense foci within the spinal cord.  These are located posteriorly adjacent to C2-C3, laterally to the left adjacent to C2-C3, laterally to the right adjacent to C6 and posterolaterally to the right adjacent to C7.  The foci were present on the 01/29/2016 .  Also mild spinal stenosis at [facility] and C6C7.  She has C4-C6 fusion.   MRI Brain 06/27/18:   "There are T2/flair hyperintense foci in the hemispheres, left thalamus, left cerebellum and spinal cord in a pattern and configuration consistent with chronic  demyelinating plaque associated with multiple sclerosis.  None of the foci are acute   MRI cervical spine 04/15/2020 showed patchy T2 hyperintensity within the cervical spine, discrete foci are located centrally adjacent to C2, posteriorly adjacent to C3, to the right adjacent to C6-C7 and to the right adjacent to C7-T1.  There did not appear to be any new lesions compared to the MRI from 2020.   There is metal artifact consistent with C3-C6 ACDF, unchanged compared to the 2020 MRI   MRI lumbar spine 02/27/2022 showed:  At L3-L4, there are degenerative changes causing borderline spinal stenosis but no nerve root compression.   At L4-L5, there are degenerative changes causing minimal spinal stenosis and mild to moderate left foraminal narrowing but no nerve root compression.  At L5-S1, there are degenerative changes but no spinal stenosis or nerve root compression.   MRI brain 02/27/2022 showed Multiple T2/FLAIR hyperintense foci in the cerebral hemispheres with a couple foci in the cerebellum, left thalamus and spinal cord in a pattern consistent with chronic demyelinating plaque associated with multiple sclerosis.  None of the foci enhanced or appear to be acute.  Compared to the MRI from 06/27/2018, there were no new lesions.   MRI cervical spine 02/27/2022 showed :  T2 hyperintense foci at the cervicomedullary junction and adjacent to C2, C3, C6-C7 and C7-T1 consistent with chronic demyelinating plaque associated with multiple sclerosis.  None of these foci enhance.  Compared to the MRI from 2022, there are no new lesions.    Remote ACDF from C3-C6, unchanged in appearance    There is mild spinal stenosis at C5-C6 and C6-C7 as detailed above.  There is moderate foraminal narrowing to the right at C5-C6 and to the left at C6-C7 but there does not appear to be any nerve root compression.  Degenerative changes are unchanged compared to the previous MRI.    REVIEW OF SYSTEMS: Out of a complete 14 system review of  symptoms, the patient complains only of the following symptoms, headaches, fatigue, weakness right lower extremity and all other reviewed systems are negative.  ALLERGIES: Allergies  Allergen Reactions   Aspirin     Heartburn   Ibuprofen     heatburn    HOME MEDICATIONS: Outpatient Medications Prior to Visit  Medication Sig Dispense Refill   albuterol (PROVENTIL) (2.5 MG/3ML) 0.083% nebulizer solution Inhale into the lungs.     atorvastatin (LIPITOR) 40 MG tablet Take by mouth.     Cholecalciferol (VITAMIN D) 2000 UNITS CAPS Take 2,000 Units by mouth daily.  dextroamphetamine (DEXEDRINE) 10 MG 24 hr capsule Take 4 po qD 120 capsule 0   diclofenac Sodium (VOLTAREN) 1 % GEL Apply one inch to lower back once a day 100 g 5   glucose blood (ACCU-CHEK GUIDE TEST) test strip Check blood sugars daily and as needed     Glucose Blood (GLUCOSE METER TEST VI) See admin instructions.     Insulin Pen Needle (PEN NEEDLES 31GX5/16") 31G X 8 MM MISC Use nightly with Lantus     loratadine (CLARITIN) 10 MG tablet Take 10 mg by mouth daily.     losartan-hydrochlorothiazide (HYZAAR) 50-12.5 MG tablet Take 1 tablet by mouth once daily 90 tablet 0   pantoprazole (PROTONIX) 40 MG tablet Take 1 tablet (40 mg total) by mouth daily. 14 tablet 0   potassium chloride SA (KLOR-CON M) 20 MEQ tablet Take 2 tablets (40 mEq total) by mouth daily. 14 tablet 0   rosuvastatin (CRESTOR) 40 MG tablet Take 1 tablet (40 mg total) by mouth daily. 90 tablet 1   Semaglutide, 1 MG/DOSE, (OZEMPIC, 1 MG/DOSE,) 4 MG/3ML SOPN Inject 1 mg into the skin once a week. 9 mL 1   ZEPOSIA 0.92 MG CAPS TAKE 1 CAPSULE BY MOUTH 1 TIME A DAY 30 capsule 2   No facility-administered medications prior to visit.    PAST MEDICAL HISTORY: Past Medical History:  Diagnosis Date   Anemia    Asthma    Diabetes mellitus without complication (HCC)    Eczema    Elevated cholesterol    Hay fever 08/03/2009   Overview:   Overview:   Qualifier:  Diagnosis of   By: Janit Bern      Hypertension    Multiple sclerosis (HCC)    Neuromuscular disorder (HCC)    Obesity    Vision abnormalities     PAST SURGICAL HISTORY: Past Surgical History:  Procedure Laterality Date   ABDOMINAL HYSTERECTOMY     CARPAL TUNNEL RELEASE     CERVICAL SPINE SURGERY     Dr. Sharolyn Douglas   LASER ABLATION     TENDON REPAIR Left    wrist surgery   TUBAL LIGATION      FAMILY HISTORY: Family History  Problem Relation Age of Onset   Hypertension Mother    Diabetes type II Mother    Stroke Mother    Diverticulitis Mother    COPD Father    Prostate cancer Father    Pancreatic cancer Sister    Heart disease Brother    Multiple sclerosis Paternal Uncle    Multiple sclerosis Paternal Grandfather    Breast cancer Neg Hx    Colon cancer Neg Hx    Stomach cancer Neg Hx    Rectal cancer Neg Hx    Colon polyps Neg Hx    Esophageal cancer Neg Hx     SOCIAL HISTORY: Social History   Socioeconomic History   Marital status: Married    Spouse name: Anothony   Number of children: 3   Years of education: 12+   Highest education level: Not on file  Occupational History   Not on file  Tobacco Use   Smoking status: Never   Smokeless tobacco: Never  Vaping Use   Vaping status: Never Used  Substance and Sexual Activity   Alcohol use: Yes    Alcohol/week: 1.0 standard drink of alcohol    Types: 1 Glasses of wine per week    Comment: occasionally   Drug use: No  Sexual activity: Yes    Birth control/protection: Surgical  Other Topics Concern   Not on file  Social History Narrative   Right handed    Caffeine OVF:IEPP sometimes.   Lives with husband,  Ethelene Browns   Social Drivers of Health   Financial Resource Strain: Not on [employer] Insecurity: Not on file  Transportation Needs: Not on file  Physical Activity: Not on file  Stress: Not on file  Social Connections: Not on file  Intimate Partner Violence: Not on file      PHYSICAL  EXAM  There were no vitals filed for this visit.   There is no height or weight on file to calculate BMI.  Generalized: Well developed, in no acute distress  Cardiology: normal rate and rhythm, no murmur noted Respiratory: clear to auscultation bilaterally  Neurological examination  Mentation: Alert oriented to time, place, history taking. Follows all commands speech and language fluent Cranial nerve II-XII: Pupils were equal round reactive to light. Extraocular movements were full, visual field were full on confrontational test. Facial sensation and strength were normal. Uvula tongue midline. Head turning and shoulder shrug  were normal and symmetric. Motor: The motor testing reveals 5 over 5 strength of all 4 extremities, with exception of right hip flexion. Good symmetric motor tone is noted throughout.  Sensory: Sensory testing is intact to soft touch on all 4 extremities. No evidence of extinction is noted.  Coordination: Cerebellar testing reveals good finger-nose-finger and mildly reduced right heel-to-shin.  Gait and station: Gait is mildly ataxic, slight foot drop of right foot.  Reflexes: Deep tendon reflexes are symmetric and normal bilaterally.   DIAGNOSTIC DATA (LABS, IMAGING, TESTING) - I reviewed patient records, labs, notes, testing and imaging myself where available.      No data to display           Lab Results  Component Value Date   WBC 5.0 02/20/2023   HGB 14.1 02/20/2023   HCT 42.1 02/20/2023   MCV 88.4 02/20/2023   PLT 406.0 (H) 02/20/2023      Component Value Date/Time   NA 139 03/07/2023 1149   NA 142 09/19/2022 1036   K 3.6 03/07/2023 1149   CL 100 03/07/2023 1149   CO2 32 03/07/2023 1149   GLUCOSE 131 (H) 03/07/2023 1149   BUN 10 03/07/2023 1149   BUN 11 09/19/2022 1036   CREATININE 0.68 03/07/2023 1149   CALCIUM 9.7 03/07/2023 1149   PROT 7.4 02/20/2023 0959   PROT 6.5 09/19/2022 1036   ALBUMIN 4.6 02/20/2023 0959   ALBUMIN 4.4  09/19/2022 1036   AST 17 02/20/2023 0959   ALT 14 02/20/2023 0959   ALKPHOS 116 02/20/2023 0959   BILITOT 0.9 02/20/2023 0959   BILITOT 0.3 09/19/2022 1036   GFRNONAA 91 09/26/2019 1604   GFRAA 105 09/26/2019 1604   Lab Results  Component Value Date   CHOL 171 11/28/2022   HDL 54.60 11/28/2022   LDLCALC 81 11/28/2022   LDLDIRECT 87.0 10/25/2022   TRIG 178.0 (H) 11/28/2022   CHOLHDL 3 11/28/2022   Lab Results  Component Value Date   HGBA1C 9.5 (H) 02/20/2023   Lab Results  Component Value Date   VITAMINB12 363 08/27/2009   Lab Results  Component Value Date   TSH 2.04 02/20/2023       ASSESSMENT AND PLAN 58 y.o. year old female  has a past medical history of Anemia, Asthma, Diabetes mellitus without complication (HCC), Eczema, Elevated cholesterol, Hay fever (  08/03/2009), Hypertension, Multiple sclerosis (HCC), Neuromuscular disorder (HCC), Obesity, and Vision abnormalities. here with   No diagnosis found.    Cathy [name] is doing well, today. She will continue current treatment plan. Labs reveiwed in Epic from 02/20/2023. Will assess lumbar spine for chronic pain. PT was helpful in [place]. I will send her again. Prefer neuro rehab if possible. We have discussed steroid taper but she declines at this time. Intolerant to Nsaids. She may continue Tylenol if helpful. I have encouraged healthy lifestyle habits. She will continue close follow up with PCP. Importance of diabetes management reviewed. She will follow up with [place] in 6 months, sooner if needed. She verbalizes understanding and agreement with this plan.    No orders of the defined types were placed in this encounter.    No orders of the defined types were placed in this encounter.     I spent 30 minutes with the patient. 50% of this time was spent counseling and educating patient on plan of care and medications.     Shawnie Dapper, FNP-C 04/05/2023, 8:25 AM [facility] [address], [address] [place], [place] [address] [phone]

## 2023-04-05 NOTE — Telephone Encounter (Signed)
 Patient was identified as falling into the [place] Measure - Diabetes.   Patient was: Appointment scheduled for lab or office visit for A1c.   FOV 06/08/23.

## 2023-04-05 NOTE — Patient Instructions (Incomplete)
 Below is our plan:  We will continue current treatment plan. We will update labs, today. I will order repeat scan for monitoring. Try taking an extended release melatonin over the counter. If this isn't helpful let me know and we can consider a prescription sleep aid.   Please make sure you are staying well hydrated. I recommend 50-60 ounces daily. Well balanced diet and regular exercise encouraged. Consistent sleep schedule with 6-8 hours recommended.   Please continue follow up with care team as directed.   Follow up with Dr Epimenio Foot in 6 months   You may receive a survey regarding today's visit. I encourage you to leave honest feed back as I do use this information to improve patient care. Thank you for seeing me today!   Management of Memory Problems   There are some general things you can do to help manage your memory problems.  Your memory may not in fact recover, but by using techniques and strategies you will be able to manage your memory difficulties better.   1)  Establish a routine. Try to establish and then stick to a regular routine.  By doing this, you will get used to what to expect and you will reduce the need to rely on your memory.  Also, try to do things at the same time of day, such as taking your medication or checking your calendar first thing in the morning. Think about think that you can do as a part of a regular routine and make a list.  Then enter them into a daily planner to remind you.  This will help you establish a routine.   2)  Organize your environment. Organize your environment so that it is uncluttered.  Decrease visual stimulation.  Place everyday items such as keys or cell phone in the same place every day (ie.  Basket next to front door) Use post it notes with a brief message to yourself (ie. Turn off light, lock the door) Use labels to indicate where things go (ie. Which cupboards are for food, dishes, etc.) Keep a notepad and pen by the telephone to take  messages   3)  Memory Aids A diary or journal/notebook/daily planner Making a list (shopping list, chore list, to do list that needs to be done) Using an alarm as a reminder (kitchen timer or cell phone alarm) Using cell phone to store information (Notes, Calendar, Reminders) Calendar/White board placed in a prominent position Post-it notes   In order for memory aids to be useful, you need to have good habits.  It's no good remembering to make a note in your journal if you don't remember to look in it.  Try setting aside a certain time of day to look in journal.   4)  Improving mood and managing fatigue. There may be other factors that contribute to memory difficulties.  Factors, such as anxiety, depression and tiredness can affect memory. Regular gentle exercise can help improve your mood and give you more energy. Exercise: there are short videos created by the [employer] on Health specially for older adults: https://bit.ly/2I30q97.  Mediterranean diet: which emphasizes fruits, vegetables, whole grains, legumes, fish, and other seafood; unsaturated fats such as olive oils; and low amounts of red meat, eggs, and sweets. A variation of this, called MIND (Mediterranean-DASH Intervention for Neurodegenerative Delay) incorporates the DASH (Dietary Approaches to Stop Hypertension) diet, which has been shown to lower high blood pressure, a risk factor for Alzheimer's disease. More information at: ExitMarketing.de.  Aerobic exercise that improve heart health is also good for the mind.  [employer] on Aging have short videos for exercises that you can do at home: BlindWorkshop.com.pt Simple relaxation techniques may help relieve symptoms of anxiety Try to get back to completing activities or hobbies you enjoyed doing in the past. Learn to pace yourself through activities to decrease fatigue. Find out about some  local support groups where you can share experiences with others. Try and achieve 7-8 hours of sleep at night.   Tasks to improve attention/working memory 1. Good sleep hygiene (7-8 hrs of sleep) 2. Learning a new skill (Painting, Carpentry, Pottery, new language, Knitting). 3.Cognitive exercises (keep a daily journal, Puzzles) 4. Physical exercise and training  (30 min/day X 4 days week) 5. Being on Antidepressant if needed 6.Yoga, Meditation, Tai Chi 7. Decrease alcohol intake 8.Have a clear schedule and structure in daily routine   MIND Diet: The Mediterranean-DASH Diet Intervention for Neurodegenerative Delay, or MIND diet, targets the health of the aging brain. Research participants with the highest MIND diet scores had a significantly slower rate of cognitive decline compared with those with the lowest scores. The effects of the MIND diet on cognition showed greater effects than either the Mediterranean or the DASH diet alone.   The healthy items the MIND diet guidelines suggest include:   3+ servings a day of whole grains 1+ servings a day of vegetables (other than green leafy) 6+ servings a week of green leafy vegetables 5+ servings a week of nuts 4+ meals a week of beans 2+ servings a week of berries 2+ meals a week of poultry 1+ meals a week of fish Mainly olive oil if added fat is used   The unhealthy items, which are higher in saturated and trans fat, include: Less than 5 servings a week of pastries and sweets Less than 4 servings a week of red meat (including beef, pork, lamb, and products made from these meats) Less than one serving a week of cheese and fried foods Less than 1 tablespoon a day of butter/stick margarine

## 2023-04-06 ENCOUNTER — Encounter: Payer: Self-pay | Admitting: Family Medicine

## 2023-04-06 ENCOUNTER — Ambulatory Visit (INDEPENDENT_AMBULATORY_CARE_PROVIDER_SITE_OTHER): Payer: Federal, State, Local not specified - PPO | Admitting: Family Medicine

## 2023-04-06 VITALS — BP 132/69 | HR 73 | Ht 66.0 in | Wt 203.0 lb

## 2023-04-06 DIAGNOSIS — G8929 Other chronic pain: Secondary | ICD-10-CM

## 2023-04-06 DIAGNOSIS — R208 Other disturbances of skin sensation: Secondary | ICD-10-CM

## 2023-04-06 DIAGNOSIS — R5383 Other fatigue: Secondary | ICD-10-CM | POA: Diagnosis not present

## 2023-04-06 DIAGNOSIS — R413 Other amnesia: Secondary | ICD-10-CM

## 2023-04-06 DIAGNOSIS — M549 Dorsalgia, unspecified: Secondary | ICD-10-CM

## 2023-04-06 DIAGNOSIS — G35 Multiple sclerosis: Secondary | ICD-10-CM

## 2023-04-06 DIAGNOSIS — E559 Vitamin D deficiency, unspecified: Secondary | ICD-10-CM | POA: Diagnosis not present

## 2023-04-06 DIAGNOSIS — Z79899 Other long term (current) drug therapy: Secondary | ICD-10-CM

## 2023-04-06 MED ORDER — DEXTROAMPHETAMINE SULFATE ER 10 MG PO CP24: ORAL_CAPSULE | ORAL | 0 refills | Status: DC

## 2023-04-07 LAB — B12 AND FOLATE PANEL
Folate: 7.5 ng/mL (ref >3.0)
Vitamin B-12: 547 pg/mL (ref 232–1245)

## 2023-04-07 LAB — VITAMIN D 25 HYDROXY (VIT D DEFICIENCY, FRACTURES): Vit D, 25-Hydroxy: 72.9 ng/mL (ref 30.0–100.0)

## 2023-04-10 ENCOUNTER — Encounter: Payer: Self-pay | Admitting: Family Medicine

## 2023-04-12 ENCOUNTER — Telehealth: Payer: Self-pay | Admitting: Family Medicine

## 2023-04-12 NOTE — Telephone Encounter (Signed)
 MR brain w/wo sent to GI for scheduling; fed [employer] does not require PA.

## 2023-04-27 ENCOUNTER — Other Ambulatory Visit: Payer: Self-pay

## 2023-04-27 NOTE — Progress Notes (Unsigned)
   04/27/2023  Patient ID: Cathy [name], female   DOB: 28-Mar-1965, 58 y.o.   MRN: [id number]  Outreach attempt for schedule telephone visit was unsuccessful, and patient's voicemail was full.  Sending MyChart message to attempt to reschedule.  Lenna Gilford, PharmD, DPLA

## 2023-05-02 ENCOUNTER — Other Ambulatory Visit: Payer: Self-pay

## 2023-05-02 NOTE — Progress Notes (Signed)
 05/02/2023 Name: Cathy [name] MRN: [id number] DOB: 1966-01-04  Chief Complaint  Patient presents with   Diabetes Management Plan   Cathy [name] is a 58 y.o. year old female who presented for a telephone visit.   They were referred to the pharmacist by a quality report for assistance in managing diabetes.   Subjective:  Care Team: Primary Care Provider: Anne Ng, NP ; Next Scheduled Visit: 06/08/23  Medication Access/Adherence  Current Pharmacy:  [facility] [address] [place], [place] - [address] AT [facility] [address] [place]. [place] [address] Phone: [phone] Fax: [phone]  [facility] [address] [place], [place] - [address] [place] [address] Phone: [phone] Fax: [phone]  -Patient reports affordability concerns with their medications: No  -Patient reports access/transportation concerns to their pharmacy: No  -Patient reports adherence concerns with their medications:  Yes    Diabetes: Current medications: Ozempic 1mg  weekly -Patient endorses taking Ozempic 1mg  off and on due to stomach upset- states she took medication at this dose for a while with no problems but then began having GI side effects thought to be from Ozempic.  Patient states she took a dose last week and is due for one now; but she had not taken since before March until recently. -Does not monitor home BG regularly due to pain from fingerstick- states CGM not affordable on her insurance plan -A1c on 1/27 increased to 9.5% from 5.9 in October  Hypertension: Current medications: losartan/hydrochlorothiazide 50-12.5mg  daily  -Patient does not have a validated, automated, upper arm home BP cuff -BP at 04/06/2023 OV w/ Neurology 132/69  Hyperlipidemia/ASCVD Risk Reduction Current lipid lowering medications: rosuvastatin 40mg  daily Medications tried in the past:  atorvastatin 40mg   Objective: Lab Results  Component Value Date   HGBA1C 9.5 (H) 02/20/2023   Lab Results  Component Value Date   CREATININE 0.68 03/07/2023   BUN 10 03/07/2023   NA 139 03/07/2023   K 3.6 03/07/2023   CL 100 03/07/2023   CO2 32 03/07/2023   Lab Results  Component Value Date   CHOL 171 11/28/2022   HDL 54.60 11/28/2022   LDLCALC 81 11/28/2022   LDLDIRECT 87.0 10/25/2022   TRIG 178.0 (H) 11/28/2022   CHOLHDL 3 11/28/2022   Medications Reviewed Today     Reviewed by Lenna Gilford, RPH (Pharmacist) on 05/02/23 at 1626  Med List Status: <None>   Medication Order Taking? Sig Documenting Provider Last Dose Status Informant  Cholecalciferol (VITAMIN D) 2000 UNITS CAPS [id number] Yes Take 4,000 Units by mouth daily. [provider] Taking Active Self  dextroamphetamine (DEXEDRINE) 10 MG 24 hr capsule [id number] Yes Take 4 po qD Lomax, Amy, NP Taking Active   diclofenac Sodium (VOLTAREN) 1 % GEL [id number]  Apply one inch to lower back once a day Sater, Pearletha Furl, MD  Active   glucose blood (ACCU-CHEK GUIDE TEST) test strip [id number] Yes Check blood sugars daily and as needed [provider] Taking Active   Glucose Blood (GLUCOSE METER TEST VI) [id number] Yes See admin instructions. [provider] Taking Active   loratadine (CLARITIN) 10 MG tablet [id number] Yes Take 10 mg by mouth daily. [provider] Taking Active Self  losartan-hydrochlorothiazide (HYZAAR) 50-12.5 MG tablet [id number] Yes Take 1 tablet by mouth once daily Nche, Bonna Gains, NP Taking Active            Med Note (GABLE,  ASHLEE L   Tue Mar 07, 2023 11:12 AM) On hold   rosuvastatin (CRESTOR) 40 MG tablet [id number] Yes Take 1 tablet (40 mg total) by mouth daily. Nche, Bonna Gains, NP Taking Active   Semaglutide, 1 MG/DOSE, (OZEMPIC, 1 MG/DOSE,) 4 MG/3ML SOPN [id number] Yes Inject 1 mg into the skin once a week. Anne Ng, NP Taking Active   ZEPOSIA 0.92 MG  CAPS [id number] Yes TAKE 1 CAPSULE BY MOUTH 1 TIME A DAY Sater, Pearletha Furl, MD Taking Active            Assessment/Plan:   Diabetes: -Currently uncontrolled -Recommend patient continue Ozempic 1mg  weekly- suggested administering in thigh, as this will sometimes decrease GI upset in patients -Monitor and record FBG 3x/wk -If unable to tolerate Ozempic, we can look into alternative GLP1's covered by insurance to see if patient would tolerate better -Test claim for [facility] 3 sensors came back for $40/month- will discuss affordability at next visit  Hypertension: -Currently moderately controlled -Suggested OTC Omron upper arm, automated BP monitor to check/record BP at least 3x/wk -If consistently >130/80, could consider increasing losartan/hydrochlorothiazide to 100-12.5mg   Hyperlipidemia/ASCVD Risk Reduction: -Currently uncontrolled.  -Recommend follow-up lipid panel and LFT's at upcoming visit 5/15; if LDL >70, consider addition of ezetimibe 10mg  daily  Follow Up Plan: 4 weeks  Lenna Gilford, PharmD, DPLA

## 2023-05-05 ENCOUNTER — Other Ambulatory Visit: Payer: Self-pay | Admitting: Family Medicine

## 2023-05-08 MED ORDER — DEXTROAMPHETAMINE SULFATE ER 10 MG PO CP24: ORAL_CAPSULE | ORAL | 0 refills | Status: DC

## 2023-05-08 NOTE — Telephone Encounter (Signed)
 Last seen on 04/06/23 Follow up scheduled on 11/08/23  Dispensed Days Supply Quantity Provider Pharmacy  DEXTROAMPHET 10MG  ER CAP 04/06/2023 30 120 capsule Lomax, Amy, NP [id number] [place]...     Rx pending to be signed

## 2023-05-10 ENCOUNTER — Other Ambulatory Visit: Payer: Self-pay | Admitting: Neurology

## 2023-05-10 DIAGNOSIS — G35 Multiple sclerosis: Secondary | ICD-10-CM

## 2023-05-20 ENCOUNTER — Other Ambulatory Visit: Payer: Self-pay | Admitting: Nurse Practitioner

## 2023-05-20 DIAGNOSIS — E1169 Type 2 diabetes mellitus with other specified complication: Secondary | ICD-10-CM

## 2023-05-22 NOTE — Telephone Encounter (Signed)
 Medication: Rosuvastatin  (Crestor ) 40 mg  Directions: Take 1 tablet by mouth daily  Last given: 12/05/22 Number refills: 1 Last o/v: 03/07/23 Follow up: 3 months-06/08/23 Labs: 11/28/22

## 2023-06-05 ENCOUNTER — Other Ambulatory Visit

## 2023-06-05 ENCOUNTER — Other Ambulatory Visit: Payer: Self-pay | Admitting: Family Medicine

## 2023-06-06 MED ORDER — DEXTROAMPHETAMINE SULFATE ER 10 MG PO CP24: ORAL_CAPSULE | ORAL | 0 refills | Status: DC

## 2023-06-06 NOTE — Telephone Encounter (Signed)
 Last seen on 04/06/23 Follow up scheduled on 11/08/23   Dispensed Days Supply Quantity Provider Pharmacy  DEXTROAMPHET CAP 10MG  ER 05/08/2023 30 120 capsule Lomax, Amy, NP [id number] [place].      Rx pending to be signed

## 2023-06-07 ENCOUNTER — Other Ambulatory Visit: Payer: Self-pay

## 2023-06-07 NOTE — Progress Notes (Unsigned)
   06/07/2023  Patient ID: Cathy [name], female   DOB: [birth year], 58 y.o.   MRN: [id number]  Outreach attempt for scheduled telephone visit unsuccessful, and patient's voicemail is currently full.  Sending MyChart message to attempt to reschedule visit.    Linn Rich, PharmD, DPLA

## 2023-06-08 ENCOUNTER — Ambulatory Visit: Admitting: Nurse Practitioner

## 2023-06-09 ENCOUNTER — Ambulatory Visit: Admitting: Nurse Practitioner

## 2023-06-20 DIAGNOSIS — H47393 Other disorders of optic disc, bilateral: Secondary | ICD-10-CM | POA: Diagnosis not present

## 2023-06-20 DIAGNOSIS — H11133 Conjunctival pigmentations, bilateral: Secondary | ICD-10-CM | POA: Diagnosis not present

## 2023-06-20 DIAGNOSIS — H40003 Preglaucoma, unspecified, bilateral: Secondary | ICD-10-CM | POA: Diagnosis not present

## 2023-06-20 DIAGNOSIS — G35 Multiple sclerosis: Secondary | ICD-10-CM | POA: Diagnosis not present

## 2023-06-20 DIAGNOSIS — H04123 Dry eye syndrome of bilateral lacrimal glands: Secondary | ICD-10-CM | POA: Diagnosis not present

## 2023-06-21 ENCOUNTER — Encounter: Payer: Self-pay | Admitting: Internal Medicine

## 2023-06-21 ENCOUNTER — Ambulatory Visit (INDEPENDENT_AMBULATORY_CARE_PROVIDER_SITE_OTHER): Admitting: Internal Medicine

## 2023-06-21 VITALS — BP 126/74 | HR 92 | Ht 65.5 in | Wt 204.0 lb

## 2023-06-21 DIAGNOSIS — Z7985 Long-term (current) use of injectable non-insulin antidiabetic drugs: Secondary | ICD-10-CM | POA: Diagnosis not present

## 2023-06-21 DIAGNOSIS — E1165 Type 2 diabetes mellitus with hyperglycemia: Secondary | ICD-10-CM | POA: Diagnosis not present

## 2023-06-21 DIAGNOSIS — K580 Irritable bowel syndrome with diarrhea: Secondary | ICD-10-CM

## 2023-06-21 MED ORDER — METRONIDAZOLE 250 MG PO TABS: 250.0000 mg | ORAL_TABLET | Freq: Three times a day (TID) | ORAL | 0 refills | Status: DC

## 2023-06-21 NOTE — Progress Notes (Signed)
 Cathy [name] 58 y.o. 28-Oct-1965 [id number]  Assessment & Plan:   Encounter Diagnoses  Name Primary?   Irritable bowel syndrome with diarrhea Yes   Uncontrolled type 2 diabetes mellitus with hyperglycemia (HCC)    It is possible she could have SIBO.  Trial of metronidazole  to 50 mg 3 times daily for 10 days.  Continue Imodium as needed.  She is satisfied with using that.  Given how this presented with nausea vomiting and diarrhea problems it could be postinfectious IBS.  Stool studies for culture and C. difficile were negative but there could have been some sort of viral syndrome causing the onset.   Do not think this is related to multiple sclerosis though within the range of possibility would be more common to have constipation.  No dietary triggers.  No clinical signs of pancreatic insufficiency.   Question contribution of uncontrolled diabetes.  She is not taking her Ozempic  regularly and I recommended she do so and follow-up with primary care provider regarding diabetes management.   CC: Nche, Connye Delaine, NP  Subjective:  Patient consented to the use of an artificial intelligence scribe application Chief Complaint: Diarrhea  HPI 58 year old woman with multiple sclerosis taking Zeposia , originally seen in this practice 03/02/2023 by Loa Riling, PA-C, complaining of recurrent nausea vomiting and diarrhea since the fall 2024.  C. difficile and culture of the stool were negative, positive H. pylori breath test treated and subsequent stool antigen negative.  She had had a negative Cologuard in February 2024.  She had been taking Ozempic  for a year at that point stopped it for 2 months and did not notice any difference.  Colonoscopy 03/16/2023-normal terminal ileum normal colon except for internal hemorrhoids random biopsies taken and were normal as part of workup for diarrhea.  EGD for nausea and vomiting same day, patchy mild erythematous mucosa in the antrum,  biopsied.  Otherwise normal exam.  Nausea and vomiting was improving at that time.  Biopsies with reactive gastropathy but no H. pylori dysplasia or intestinal metaplasia.  Current symptoms: Husband is present and participates in the interview today. She experiences diarrhea once or twice a week, with multiple watery to very soft bowel movements per episode. Imodium is effective in controlling symptoms. The diarrhea began after starting Ozempic , which she has discontinued without symptom change. Her Crestor  dosage was increased from 20 mg to 40 mg around the same time. She has no constipation and regular bowel movements on other days. Sulfur-smelling belches occur during episodes, and diarrhea can wake her at night. Stools do not appear orange, greasy, or oily. No specific dietary triggers have been identified, including lactose or artificial sweeteners, and she uses lactaid with rare use of milk products.   MRI brain for follow-up of multiple sclerosis May 26, 2022 stable no new lesions. B12 and folate normal 04/06/2023.  Vitamin D  72.9   Wt Readings from Last 3 Encounters:  06/21/23 204 lb (92.5 kg)  04/06/23 203 lb (92.1 kg)  03/16/23 196 lb (88.9 kg)    Allergies  Allergen Reactions   Aspirin     Heartburn   Ibuprofen     heatburn   Current Meds  Medication Sig   Cholecalciferol (VITAMIN D ) 2000 UNITS CAPS Take 4,000 Units by mouth daily.   dextroamphetamine  (DEXEDRINE ) 10 MG 24 hr capsule Take 4 po qD   diclofenac  Sodium (VOLTAREN ) 1 % GEL Apply one inch to lower back once a day   glucose blood (ACCU-CHEK GUIDE TEST) test  strip Check blood sugars daily and as needed   Glucose Blood (GLUCOSE METER TEST VI) See admin instructions.   loperamide (IMODIUM A-D) 2 MG tablet Take 2 mg by mouth as needed for diarrhea or loose stools.   loratadine (CLARITIN) 10 MG tablet Take 10 mg by mouth daily.   losartan -hydrochlorothiazide (HYZAAR) 50-12.5 MG tablet Take 1 tablet by mouth once daily    rosuvastatin  (CRESTOR ) 40 MG tablet Take 1 tablet by mouth once daily   Semaglutide , 1 MG/DOSE, (OZEMPIC , 1 MG/DOSE,) 4 MG/3ML SOPN Inject 1 mg into the skin once a week.   ZEPOSIA  0.92 MG CAPS TAKE 1 CAPSULE BY MOUTH 1 TIME A DAY   Past Medical History:  Diagnosis Date   Anemia    Asthma    Diabetes mellitus without complication (HCC)    Eczema    Elevated cholesterol    Hay fever 08/03/2009   Overview:   Overview:   Qualifier: Diagnosis of   By: Tracy Friedlander      Hypertension    Multiple sclerosis (HCC)    Neuromuscular disorder (HCC)    Obesity    Vision abnormalities    Past Surgical History:  Procedure Laterality Date   ABDOMINAL HYSTERECTOMY     CARPAL TUNNEL RELEASE     CERVICAL SPINE SURGERY     Dr. Jaquita Merl   LASER ABLATION     TENDON REPAIR Left    wrist surgery   TUBAL LIGATION     Social History   Social History Narrative   Right handed    Caffeine UJW:JXBJ sometimes.   Lives with husband,  Arnetta Lank   Pt works    family history includes COPD in her father; Diabetes type II in her mother; Diverticulitis in her mother; Heart disease in her brother; Hypertension in her mother; Multiple sclerosis in her maternal uncle, paternal grandfather, and paternal uncle; Pancreatic cancer in her sister; Prostate cancer in her father; Stroke in her mother.   Review of Systems As per HPI  Objective:   Physical Exam BP 126/74   Pulse 92   Ht 5' 5.5" (1.664 m)   Wt 204 lb (92.5 kg)   BMI 33.43 kg/m

## 2023-06-21 NOTE — Patient Instructions (Signed)
 We have sent the following medications to your pharmacy for you to pick up at your convenience: Generic Flagyl  for your diarrhea  Continue your Imodium.  Follow up regarding your diabetes and the ozempic . Should be taken regularly.  I appreciate the opportunity to care for you. Loy Ruff, MD, [facility]

## 2023-06-30 ENCOUNTER — Ambulatory Visit: Admitting: Nurse Practitioner

## 2023-07-06 ENCOUNTER — Other Ambulatory Visit: Payer: Self-pay | Admitting: Neurology

## 2023-07-06 MED ORDER — DEXTROAMPHETAMINE SULFATE ER 10 MG PO CP24: ORAL_CAPSULE | ORAL | 0 refills | Status: DC

## 2023-07-06 NOTE — Telephone Encounter (Signed)
 Last seen on 04/06/23 Follow up scheduled on 11/08/23   Dispensed Days Supply Quantity Provider Pharmacy  DEXTROAMPHET CAP 10MG  ER 06/07/2023 30 120 capsule Lomax, Amy, NP [id number] [place]...     Rx pending to be signed

## 2023-07-06 NOTE — Telephone Encounter (Signed)
 Pt called to request Medication Refill  dextroamphetamine  (DEXEDRINE ) 10 MG 24 hr capsule  Pt medication is to be sent to    [facility] [address] - [place], [place] - [address] AT [facility] [address] (Ph: [phone])

## 2023-08-04 ENCOUNTER — Ambulatory Visit: Admitting: Nurse Practitioner

## 2023-08-07 ENCOUNTER — Other Ambulatory Visit: Payer: Self-pay | Admitting: Family Medicine

## 2023-08-07 MED ORDER — DEXTROAMPHETAMINE SULFATE ER 10 MG PO CP24: ORAL_CAPSULE | ORAL | 0 refills | Status: DC

## 2023-08-07 NOTE — Telephone Encounter (Signed)
 Patient request refill for dextroamphetamine  (DEXEDRINE ) 10 MG 24 hr capsule send to  [id number]

## 2023-08-07 NOTE — Telephone Encounter (Signed)
 Last seen on 04/06/23 Follow up scheduled on 11/08/23   Dispensed Days Supply Quantity Provider Pharmacy  DEXTROAMPHET CAP 10MG  ER 07/06/2023 30 120 capsule Sater, Charlie LABOR, MD [facility] [phone] [place]...   Rx pending to be signed

## 2023-08-08 ENCOUNTER — Encounter: Payer: Self-pay | Admitting: Nurse Practitioner

## 2023-08-08 ENCOUNTER — Ambulatory Visit: Admitting: Nurse Practitioner

## 2023-08-08 VITALS — BP 134/72 | HR 100 | Temp 98.5°F | Ht 65.5 in | Wt 202.0 lb

## 2023-08-08 DIAGNOSIS — E785 Hyperlipidemia, unspecified: Secondary | ICD-10-CM | POA: Diagnosis not present

## 2023-08-08 DIAGNOSIS — Z7985 Long-term (current) use of injectable non-insulin antidiabetic drugs: Secondary | ICD-10-CM

## 2023-08-08 DIAGNOSIS — E1169 Type 2 diabetes mellitus with other specified complication: Secondary | ICD-10-CM | POA: Diagnosis not present

## 2023-08-08 DIAGNOSIS — R109 Unspecified abdominal pain: Secondary | ICD-10-CM

## 2023-08-08 DIAGNOSIS — I10 Essential (primary) hypertension: Secondary | ICD-10-CM | POA: Diagnosis not present

## 2023-08-08 LAB — COMPREHENSIVE METABOLIC PANEL WITH GFR
ALT: 18 U/L (ref 0–35)
AST: 18 U/L (ref 0–37)
Albumin: 4.4 g/dL (ref 3.5–5.2)
Alkaline Phosphatase: 84 U/L (ref 39–117)
BUN: 13 mg/dL (ref 6–23)
CO2: 33 meq/L — ABNORMAL HIGH (ref 19–32)
Calcium: 9.8 mg/dL (ref 8.4–10.5)
Chloride: 99 meq/L (ref 96–112)
Creatinine, Ser: 0.66 mg/dL (ref 0.40–1.20)
GFR: 97.2 mL/min (ref >60.00)
Glucose, Bld: 104 mg/dL — ABNORMAL HIGH (ref 70–99)
Potassium: 3.6 meq/L (ref 3.5–5.1)
Sodium: 138 meq/L (ref 135–145)
Total Bilirubin: 0.7 mg/dL (ref 0.2–1.2)
Total Protein: 6.9 g/dL (ref 6.0–8.3)

## 2023-08-08 LAB — MICROALBUMIN / CREATININE URINE RATIO
Creatinine,U: 148.8 mg/dL
Microalb Creat Ratio: 15.7 mg/g (ref 0.0–30.0)
Microalb, Ur: 2.3 mg/dL — ABNORMAL HIGH (ref 0.0–1.9)

## 2023-08-08 LAB — LIPID PANEL
Cholesterol: 164 mg/dL (ref 0–200)
HDL: 55.4 mg/dL (ref >39.00)
LDL Cholesterol: 78 mg/dL (ref 0–99)
NonHDL: 108.73
Total CHOL/HDL Ratio: 3
Triglycerides: 153 mg/dL — ABNORMAL HIGH (ref 0.0–149.0)
VLDL: 30.6 mg/dL (ref 0.0–40.0)

## 2023-08-08 LAB — POCT GLYCOSYLATED HEMOGLOBIN (HGB A1C): Hemoglobin A1C: 6 % — AB (ref 4.0–5.6)

## 2023-08-08 MED ORDER — OZEMPIC (1 MG/DOSE) 4 MG/3ML ~~LOC~~ SOPN: 1.0000 mg | PEN_INJECTOR | SUBCUTANEOUS | 1 refills | Status: DC

## 2023-08-08 MED ORDER — FREESTYLE LIBRE 3 PLUS SENSOR MISC: 11 refills | Status: AC

## 2023-08-08 NOTE — Assessment & Plan Note (Addendum)
 No glucose check at home Current use of ozempic  1mg  Denies any adverse effects No retinopathy Positive neuropathy Repeat hgbA1c: 6.0% improved from 9%  Maintain med dose Repeat UACr today She agreed to use freestyle libre CGM. Prescription sent Encouraged to maintain daily exercise, low carb/low sugar diet F/up in 3months

## 2023-08-08 NOTE — Assessment & Plan Note (Addendum)
 Repeat lipid panel and CMP Maintain crestor  dose

## 2023-08-08 NOTE — Assessment & Plan Note (Signed)
 BP at goal with losartan /hydrochlorothiazide BP Readings from Last 3 Encounters:  08/08/23 134/72  06/21/23 126/74  04/06/23 132/69    Repeat CMP Maintain med dose

## 2023-08-08 NOTE — Progress Notes (Signed)
 Established Patient Visit  Patient: Cathy [name]   DOB: February 18, 1965   58 y.o. Female  MRN: [id number] Visit Date: 08/08/2023  Subjective:    Chief Complaint  Patient presents with   Follow-up    3 month follow up for HTN, DM and hyperlipidemia  Refills needed    Pain    Right side pain for a couple of week sharp pain    HPI Hypertension associated with diabetes (HCC) BP at goal with losartan /hydrochlorothiazide BP Readings from Last 3 Encounters:  08/08/23 134/72  06/21/23 126/74  04/06/23 132/69    Repeat CMP Maintain med dose  DM (diabetes mellitus) (HCC) No glucose check at home Current use of ozempic  1mg  Denies any adverse effects No retinopathy Positive neuropathy Repeat hgbA1c: 6.0% improved from 9%  Maintain med dose Repeat UACr today She agreed to use freestyle libre CGM. Prescription sent Encouraged to maintain daily exercise, low carb/low sugar diet F/up in 3months  Hyperlipidemia associated with type 2 diabetes mellitus (HCC) Repeat lipid panel and CMP Maintain crestor  dose  Wt Readings from Last 3 Encounters:  08/08/23 202 lb (91.6 kg)  06/21/23 204 lb (92.5 kg)  04/06/23 203 lb (92.1 kg)    BP Readings from Last 3 Encounters:  08/08/23 134/72  06/21/23 126/74  04/06/23 132/69    Reviewed medical, surgical, and social history today  Medications: Outpatient Medications Prior to Visit  Medication Sig   Cholecalciferol (VITAMIN D ) 2000 UNITS CAPS Take 4,000 Units by mouth daily.   diclofenac  Sodium (VOLTAREN ) 1 % GEL Apply one inch to lower back once a day   glucose blood (ACCU-CHEK GUIDE TEST) test strip Check blood sugars daily and as needed   Glucose Blood (GLUCOSE METER TEST VI) See admin instructions.   loperamide (IMODIUM A-D) 2 MG tablet Take 2 mg by mouth as needed for diarrhea or loose stools.   loratadine (CLARITIN) 10 MG tablet Take 10 mg by mouth daily.   losartan -hydrochlorothiazide (HYZAAR) 50-12.5 MG  tablet Take 1 tablet by mouth once daily   rosuvastatin  (CRESTOR ) 40 MG tablet Take 1 tablet by mouth once daily   ZEPOSIA  0.92 MG CAPS TAKE 1 CAPSULE BY MOUTH 1 TIME A DAY   [DISCONTINUED] Semaglutide , 1 MG/DOSE, (OZEMPIC , 1 MG/DOSE,) 4 MG/3ML SOPN Inject 1 mg into the skin once a week.   dextroamphetamine  (DEXEDRINE ) 10 MG 24 hr capsule Take 4 po qD   [DISCONTINUED] metroNIDAZOLE  (FLAGYL ) 250 MG tablet Take 1 tablet (250 mg total) by mouth 3 (three) times daily. (Patient not taking: Reported on 08/08/2023)   No facility-administered medications prior to visit.   Reviewed past medical and social history.   ROS per HPI above      Objective:  BP 134/72 (BP Location: Left Arm, Patient Position: Sitting)   Pulse 100   Temp 98.5 F (36.9 C) (Oral)   Ht 5' 5.5 (1.664 m)   Wt 202 lb (91.6 kg)   SpO2 99%   BMI 33.10 kg/m      Physical Exam Cardiovascular:     Rate and Rhythm: Normal rate and regular rhythm.     Pulses: Normal pulses.     Heart sounds: Normal heart sounds.  Pulmonary:     Effort: Pulmonary effort is normal.     Breath sounds: Normal breath sounds.  Musculoskeletal:     Right lower leg: No edema.     Left lower leg: No  edema.  Neurological:     Mental Status: She is alert and oriented to person, place, and time.     Results for orders placed or performed in visit on 08/08/23  POCT glycosylated hemoglobin (Hb A1C)  Result Value Ref Range   Hemoglobin A1C 6.0 (A) 4.0 - 5.6 %   HbA1c POC (<> result, manual entry)     HbA1c, POC (prediabetic range)     HbA1c, POC (controlled diabetic range)        Assessment & Plan:    Problem List Items Addressed This Visit     DM (diabetes mellitus) (HCC) - Primary   No glucose check at home Current use of ozempic  1mg  Denies any adverse effects No retinopathy Positive neuropathy Repeat hgbA1c: 6.0% improved from 9%  Maintain med dose Repeat UACr today She agreed to use freestyle libre CGM. Prescription  sent Encouraged to maintain daily exercise, low carb/low sugar diet F/up in 3months      Relevant Medications   Continuous Glucose Sensor (FREESTYLE LIBRE 3 PLUS SENSOR) MISC   Semaglutide , 1 MG/DOSE, (OZEMPIC , 1 MG/DOSE,) 4 MG/3ML SOPN   Other Relevant Orders   Urine Microalbumin w/creat. ratio   Comprehensive metabolic panel with GFR   POCT glycosylated hemoglobin (Hb A1C) (Completed)   Hyperlipidemia associated with type 2 diabetes mellitus (HCC)   Repeat lipid panel and CMP Maintain crestor  dose      Relevant Medications   Semaglutide , 1 MG/DOSE, (OZEMPIC , 1 MG/DOSE,) 4 MG/3ML SOPN   Other Relevant Orders   Comprehensive metabolic panel with GFR   Lipid panel   Hypertension associated with diabetes (HCC)   BP at goal with losartan /hydrochlorothiazide BP Readings from Last 3 Encounters:  08/08/23 134/72  06/21/23 126/74  04/06/23 132/69    Repeat CMP Maintain med dose      Relevant Medications   Semaglutide , 1 MG/DOSE, (OZEMPIC , 1 MG/DOSE,) 4 MG/3ML SOPN   Other Visit Diagnoses       Acute right flank pain       Relevant Orders   Urinalysis w microscopic + reflex cultur      Return in about 3 months (around 11/08/2023) for HTN, DM, hyperlipidemia (fasting).     Roselie Mood, NP

## 2023-08-08 NOTE — Patient Instructions (Addendum)
 Go to lab Maintain Heart healthy diet and daily exercise. Monitor glucose with freestyle libre hgbA1c at 6.0%: improved from 9% Maintain current medications.

## 2023-08-09 LAB — POC URINALSYSI DIPSTICK (AUTOMATED)
Bilirubin, UA: NEGATIVE
Blood, UA: NEGATIVE
Glucose, UA: NEGATIVE
Ketones, UA: NEGATIVE
Nitrite, UA: NEGATIVE
Protein, UA: POSITIVE — AB
Spec Grav, UA: 1.01 (ref 1.010–1.025)
Urobilinogen, UA: NEGATIVE U/dL — AB
pH, UA: 6.5 (ref 5.0–8.0)

## 2023-08-09 NOTE — Addendum Note (Signed)
 Addended by: LENON ROUGHEN on: 08/09/2023 08:15 AM   Modules accepted: Orders

## 2023-08-11 ENCOUNTER — Ambulatory Visit: Payer: Self-pay | Admitting: Nurse Practitioner

## 2023-08-11 DIAGNOSIS — I10 Essential (primary) hypertension: Secondary | ICD-10-CM

## 2023-08-11 MED ORDER — LOSARTAN POTASSIUM-HCTZ 50-12.5 MG PO TABS: 1.0000 | ORAL_TABLET | Freq: Every day | ORAL | 1 refills | Status: AC

## 2023-09-04 ENCOUNTER — Other Ambulatory Visit: Payer: Self-pay | Admitting: Nurse Practitioner

## 2023-09-04 ENCOUNTER — Other Ambulatory Visit: Payer: Self-pay | Admitting: Family Medicine

## 2023-09-04 DIAGNOSIS — Z1231 Encounter for screening mammogram for malignant neoplasm of breast: Secondary | ICD-10-CM

## 2023-09-04 MED ORDER — DEXTROAMPHETAMINE SULFATE ER 10 MG PO CP24: ORAL_CAPSULE | ORAL | 0 refills | Status: DC

## 2023-09-04 NOTE — Telephone Encounter (Signed)
 PT called to request medication refill dextroamphetamine  (DEXEDRINE ) 10 MG 24 hr capsule  Pt would like medication sent to   v  [facility] [address] - [place], [place] - [address] [place]. AT [facility] [address] (Ph: [phone])

## 2023-09-04 NOTE — Telephone Encounter (Signed)
 Last seen on 04/06/23 Follow up scheduled on 11/08/23    Dispensed Days Supply Quantity Provider Pharmacy  DEXTROAMPHET CAP 10MG  ER 08/08/2023 29 117 capsule Sater, Charlie LABOR, MD [facility] [address]    Rx pending to be signed

## 2023-09-11 ENCOUNTER — Encounter

## 2023-10-04 ENCOUNTER — Other Ambulatory Visit: Payer: Self-pay | Admitting: Family Medicine

## 2023-10-04 MED ORDER — DEXTROAMPHETAMINE SULFATE ER 10 MG PO CP24: ORAL_CAPSULE | ORAL | 0 refills | Status: DC

## 2023-10-04 NOTE — Telephone Encounter (Signed)
 Last seen 04/06/23 and next f/u 11/08/23. Last refilled 09/05/23 #120.

## 2023-10-04 NOTE — Telephone Encounter (Signed)
 Pt called to request Medication refill   dextroamphetamine  (DEXEDRINE ) 10 MG 24 hr capsule   Pt would like medication sent to   [facility] [address] - [place], [place] - [address] [place]. AT [facility] [address] Phone: [phone]  Fax: [phone]

## 2023-11-01 ENCOUNTER — Other Ambulatory Visit: Payer: Self-pay | Admitting: Neurology

## 2023-11-01 MED ORDER — DEXTROAMPHETAMINE SULFATE ER 10 MG PO CP24: ORAL_CAPSULE | ORAL | 0 refills | Status: DC

## 2023-11-01 NOTE — Telephone Encounter (Signed)
 Pt called to request  Medication refill dextroamphetamine  (DEXEDRINE ) 10 MG 24 hr capsule   Pt medication is to be sent to   [facility] [address] - [place], [place] - [address]. AT [facility] [address] (Ph: [phone])

## 2023-11-01 NOTE — Telephone Encounter (Signed)
 Last seen 04/06/23 and next f/u 11/08/23. Last refilled 10/04/23 #120.

## 2023-11-08 ENCOUNTER — Encounter: Payer: Self-pay | Admitting: Neurology

## 2023-11-08 ENCOUNTER — Ambulatory Visit: Admitting: Neurology

## 2023-11-08 VITALS — BP 120/82 | HR 89 | Ht 65.5 in | Wt 209.0 lb

## 2023-11-08 DIAGNOSIS — Z79899 Other long term (current) drug therapy: Secondary | ICD-10-CM | POA: Diagnosis not present

## 2023-11-08 DIAGNOSIS — M545 Low back pain, unspecified: Secondary | ICD-10-CM

## 2023-11-08 DIAGNOSIS — R41840 Attention and concentration deficit: Secondary | ICD-10-CM | POA: Diagnosis not present

## 2023-11-08 DIAGNOSIS — G8929 Other chronic pain: Secondary | ICD-10-CM

## 2023-11-08 DIAGNOSIS — G4700 Insomnia, unspecified: Secondary | ICD-10-CM

## 2023-11-08 DIAGNOSIS — R208 Other disturbances of skin sensation: Secondary | ICD-10-CM

## 2023-11-08 DIAGNOSIS — G35A Relapsing-remitting multiple sclerosis: Secondary | ICD-10-CM | POA: Diagnosis not present

## 2023-11-08 MED ORDER — TIZANIDINE HCL 4 MG PO TABS: ORAL_TABLET | ORAL | 5 refills | Status: AC

## 2023-11-08 NOTE — Progress Notes (Signed)
 [facility]  PATIENT: Cathy [name] DOB: 1965-03-12  REFERRING DOCTOR OR PCP:  Toribio Constable  SOURCE: patient and medical records available  _________________________________   HISTORICAL  CHIEF COMPLAINT:  Chief Complaint  Patient presents with   Follow-up    Pt in room 11. Husband in room. Here for MS follow up.    HISTORY OF PRESENT ILLNESS:  Cathy [name] is a 58 y.o. woman with MS andgait issues.  Update 11/08/2023: She is on Zeposia  and is tolerating it well.   She has no exacerbations recently. Brain MRi 2/4/224 was stable.    MRI cervical  spine 02/27/2022 shows several older foci but none new since prior MRI   She is reporting more LBP.  Pain is axial lower back pain without radiation.  MRI 02/2022 showed DJD with facet hypertrophy and bulges but no nerve root compression at L3-L4 through L5-S1.   Back pain is worse on the right.  She notes increased pain with sustained standing or walking.   Better if sitting..    She is on gabapentin  and Tylenol  without much be   No exacerbation pr new MS symptom.   Gait is about the same with a mild limp.   No falls but rare stumbles and se needs to be areful on stairs.    She notes mild right leg weakness.   She has some right leg tingling at times.      Bladder functon is fine.   Some constipation.    She notes no new vision issues and needs reading glasses.   Colors are dulled OD       She has had some jerks at times in her hands, especially if tired.  She has insomnia about 1/2 the nights.   She snores some.   She feels short of breath at night if she lays on her back but not on her side..  She often is sleepy during the day.   She notes some RLS.    She notes reduced STM and attention/focus.   Also mild verbal processing issues.   She is on dextroamphetamine  10 mg 4 pills in the morning -  helps her attention and fatigue but not memory.    She denies dperession but she has had some anxiety and  jitteriness.      She has lost 20 pounds with Ozempic  for DM  MS History:   She was diagnosed with MS after presenting with optic neuritis x 2 in 2009.   She had an MRI and LP and was diagnosed with MS.     She started on Copaxone and switched to Gilenya  in 2013.    She denies any further exacerbation since the optic neuritis but was told she had MS plaques on her spine when she presented last year with a cervical radiculopathy requiring surgery.    I have seen her the past year at [facility].   Her last MRI's were Jan 21, 2014.  MRI images show foci in the cerebellum and the hemispheres consistent with MS. There were no acute findings. She has NIDDM, HTN and high cholesterol.  Imaging: MRI cervical spine 06/27/18 showed several T2 hyperintense foci within the spinal cord.  These are located posteriorly adjacent to C2-C3, laterally to the left adjacent to C2-C3, laterally to the right adjacent to C6 and posterolaterally to the right adjacent to C7.  The foci were present on the 01/29/2016 .  Also mild spinal stenosis at [facility] and C6C7.  She has C4-C6 fusion.  MRI Brain 06/27/18:   There are T2/flair hyperintense foci in the hemispheres, left thalamus, left cerebellum and spinal cord in a pattern and configuration consistent with chronic demyelinating plaque associated with multiple sclerosis.  None of the foci are acute  MRI cervical spine 04/15/2020 showed patchy T2 hyperintensity within the cervical spine, discrete foci are located centrally adjacent to C2, posteriorly adjacent to C3, to the right adjacent to C6-C7 and to the right adjacent to C7-T1.  There did not appear to be any new lesions compared to the MRI from 2020.   There is metal artifact consistent with C3-C6 ACDF, unchanged compared to the 2020 MRI  MRI lumbar spine 02/27/2022 showed:  At L3-L4, there are degenerative changes causing borderline spinal stenosis but no nerve root compression.   At L4-L5, there are degenerative changes  causing minimal spinal stenosis and mild to moderate left foraminal narrowing but no nerve root compression.  At L5-S1, there are degenerative changes but no spinal stenosis or nerve root compression.  MRI brain 02/27/2022 showed Multiple T2/FLAIR hyperintense foci in the cerebral hemispheres with a couple foci in the cerebellum, left thalamus and spinal cord in a pattern consistent with chronic demyelinating plaque associated with multiple sclerosis.  None of the foci enhanced or appear to be acute.  Compared to the MRI from 06/27/2018, there were no new lesions.  MRI cervical spine 02/27/2022 showed :  T2 hyperintense foci at the cervicomedullary junction and adjacent to C2, C3, C6-C7 and C7-T1 consistent with chronic demyelinating plaque associated with multiple sclerosis.  None of these foci enhance.  Compared to the MRI from 2022, there are no new lesions.    Remote ACDF from C3-C6, unchanged in appearance    There is mild spinal stenosis at C5-C6 and C6-C7 as detailed above.  There is moderate foraminal narrowing to the right at C5-C6 and to the left at C6-C7 but there does not appear to be any nerve root compression.  Degenerative changes are unchanged compared to the previous MRI.    MRI cervical spine 02/27/2022 showed  REVIEW OF SYSTEMS: Constitutional: No fevers, chills, sweats, or change in appetite.   Has fatigue Eyes: H/o right ON with residual color desaturation Ear, nose and throat: No hearing loss, ear pain, nasal congestion, sore throat Cardiovascular: No chest pain, palpitations Respiratory:  No shortness of breath at rest or with exertion.   No wheezes GastrointestinaI: No nausea, vomiting, diarrhea, abdominal pain, fecal incontinence Genitourinary:  No dysuria, urinary retention or frequency.  No nocturia. Musculoskeletal:  No neck pain, back pain,. Notes left shoulder pain. Integumentary: No rash, pruritus, skin lesions Neurological: as above Psychiatric: No depression at this time.   No anxiety Endocrine: has NIDDM.  No palpitations, diaphoresis, change in appetite, change in weigh or increased thirst Hematologic/Lymphatic:  No anemia, purpura, petechiae. Allergic/Immunologic: No itchy/runny eyes, nasal congestion, recent allergic reactions, rashes  ALLERGIES: Allergies  Allergen Reactions   Aspirin     Heartburn   Ibuprofen     heatburn    HOME MEDICATIONS:  Current Outpatient Medications:    Cholecalciferol (VITAMIN D ) 2000 UNITS CAPS, Take 4,000 Units by mouth daily., Disp: , Rfl:    Continuous Glucose Sensor (FREESTYLE LIBRE 3 PLUS SENSOR) MISC, Change sensor every 15 days., Disp: 2 each, Rfl: 11   dextroamphetamine  (DEXEDRINE ) 10 MG 24 hr capsule, Take 4 po qD, Disp: 120 capsule, Rfl: 0   diclofenac  Sodium (VOLTAREN ) 1 % GEL, Apply one inch to  lower back once a day, Disp: 100 g, Rfl: 5   glucose blood (ACCU-CHEK GUIDE TEST) test strip, Check blood sugars daily and as needed, Disp: , Rfl:    Glucose Blood (GLUCOSE METER TEST VI), See admin instructions., Disp: , Rfl:    loperamide (IMODIUM A-D) 2 MG tablet, Take 2 mg by mouth as needed for diarrhea or loose stools., Disp: , Rfl:    loratadine (CLARITIN) 10 MG tablet, Take 10 mg by mouth daily., Disp: , Rfl:    losartan -hydrochlorothiazide (HYZAAR) 50-12.5 MG tablet, Take 1 tablet by mouth daily., Disp: 90 tablet, Rfl: 1   rosuvastatin  (CRESTOR ) 40 MG tablet, Take 1 tablet by mouth once daily, Disp: 90 tablet, Rfl: 1   Semaglutide , 1 MG/DOSE, (OZEMPIC , 1 MG/DOSE,) 4 MG/3ML SOPN, Inject 1 mg into the skin once a week., Disp: 9 mL, Rfl: 1   tiZANidine (ZANAFLEX) 4 MG tablet, Take 1-2 po qHS, Disp: 60 tablet, Rfl: 5   ZEPOSIA  0.92 MG CAPS, TAKE 1 CAPSULE BY MOUTH 1 TIME A DAY, Disp: 30 capsule, Rfl: 5  PAST MEDICAL HISTORY: Past Medical History:  Diagnosis Date   Anemia    Asthma    Diabetes mellitus without complication (HCC)    Eczema    Elevated cholesterol    Hay fever 08/03/2009   Overview:    Overview:   Qualifier: Diagnosis of   By: Antonio ROSALEA Rockers      Hypertension    Multiple sclerosis    Neuromuscular disorder (HCC)    Obesity    Vision abnormalities     PAST SURGICAL HISTORY: Past Surgical History:  Procedure Laterality Date   ABDOMINAL HYSTERECTOMY     CARPAL TUNNEL RELEASE     CERVICAL SPINE SURGERY     Dr. Royden Schneider   LASER ABLATION     TENDON REPAIR Left    wrist surgery   TUBAL LIGATION      FAMILY HISTORY: Family History  Problem Relation Age of Onset   Hypertension Mother    Diabetes type II Mother    Stroke Mother    Diverticulitis Mother    COPD Father    Prostate cancer Father    Pancreatic cancer Sister    Heart disease Brother    Multiple sclerosis Maternal Uncle    Multiple sclerosis Paternal Uncle    Multiple sclerosis Paternal Grandfather    Breast cancer Neg Hx    Colon cancer Neg Hx    Stomach cancer Neg Hx    Rectal cancer Neg Hx    Colon polyps Neg Hx    Esophageal cancer Neg Hx     SOCIAL HISTORY:  Social History   Socioeconomic History   Marital status: Married    Spouse name: Anothony   Number of children: 3   Years of education: 12+   Highest education level: Not on file  Occupational History   Not on file  Tobacco Use   Smoking status: Never   Smokeless tobacco: Never  Vaping Use   Vaping status: Never Used  Substance and Sexual Activity   Alcohol use: Yes    Alcohol/week: 1.0 standard drink of alcohol    Types: 1 Glasses of wine per week    Comment: occasionally   Drug use: No   Sexual activity: Yes    Birth control/protection: Surgical  Other Topics Concern   Not on file  Social History Narrative   Right handed    Caffeine ldz:dnij sometimes.   Lives with  husband,  Curtistine   Pt works    Social Drivers of Corporate investment banker Strain: Not on [employer] Insecurity: Not on file  Transportation Needs: Not on file  Physical Activity: Not on file  Stress: Not on file  Social Connections: Not  on file  Intimate Partner Violence: Not on file     PHYSICAL EXAM  Vitals:   11/08/23 1541  BP: 120/82  Pulse: 89  SpO2: 100%  Weight: 209 lb (94.8 kg)  Height: 5' 5.5 (1.664 m)     Body mass index is 34.25 kg/m.   General: The patient is well-developed and well-nourished and in no acute distress.      Neurologic Exam  Mental status: The patient is alert and oriented x 3 at the time of the examination. The patient has apparent normal recent and remote memory, with an apparently normal attention span and concentration ability.   Speech is normal.  Cranial nerves: Extraocular movements are full.  Facial strength and sensation was normal.  Trapezius strength is normal.     No obvious hearing deficits are noted.  Motor:  Muscle bulk is normal.   Muscle tone is mildly increased in the right leg and strength was 4+/5 in the right foot and left triceps.  .  Strength is 5/5 elsewhere.      Sensory: intact touch/vibration sensation  Coordination: Cerebellar testing reveals good finger-nose-finger and reduced right heel to shin.  Gait and station: Station is normal.   Gait is mildly wide.  She has difficulty with tandem gait.  Romberg is negative..  Reflexes: Deep tendon reflexes are symmetric and normal bilaterally.         1. Relapsing remitting multiple sclerosis   2. High risk medication use   3. Dysesthesia   4. Attention or concentration deficit   5. Insomnia, unspecified type   6. Chronic low back pain, unspecified back pain laterality, unspecified whether sciatica present       1.   Continue Zeposis for MS.  We will check blood work today.  Additionally we will check an MRI of the brain to determine if there is any breakthrough activity.  If present, I would consider a highly effective disease modifying therapy such as an anti-CD20 agent or  natalizumab\ 2.   Continue  Dexedrine  for fatigue and attention deficit.     3.   Try to stay active and exercise as  tolerated.    She should continue to do lower back exercises and PT 4.   Insomnia has been more of a problem.  I will send in a prescription for tizanidine to take at bedtime.  This will hopefully help the tizanidine and also may help back spasms  return in 6 months or sooner if there are new or worsening neurologic symptoms.   Aneira Cavitt A. Vear, MD, PhD 11/08/2023, 4:07 PM Certified in Neurology, Clinical Neurophysiology, Sleep Medicine, Pain Medicine and Neuroimaging  [facility] [address] [place], [place] [address] [phone] -

## 2023-11-09 LAB — CBC WITH DIFFERENTIAL/PLATELET
Basophils Absolute: 0 x10E3/uL (ref 0.0–0.2)
Basos: 1 %
EOS (ABSOLUTE): 0.1 x10E3/uL (ref 0.0–0.4)
Eos: 4 %
Hematocrit: 35.7 % (ref 34.0–46.6)
Hemoglobin: 11.9 g/dL (ref 11.1–15.9)
Immature Grans (Abs): 0 x10E3/uL (ref 0.0–0.1)
Immature Granulocytes: 0 %
Lymphocytes Absolute: 0.5 x10E3/uL — ABNORMAL LOW (ref 0.7–3.1)
Lymphs: 13 %
MCH: 29.7 pg (ref 26.6–33.0)
MCHC: 33.3 g/dL (ref 31.5–35.7)
MCV: 89 fL (ref 79–97)
Monocytes Absolute: 0.5 x10E3/uL (ref 0.1–0.9)
Monocytes: 12 %
Neutrophils Absolute: 2.8 x10E3/uL (ref 1.4–7.0)
Neutrophils: 70 %
Platelets: 336 x10E3/uL (ref 150–450)
RBC: 4.01 x10E6/uL (ref 3.77–5.28)
RDW: 13.5 % (ref 11.7–15.4)
WBC: 4 x10E3/uL (ref 3.4–10.8)

## 2023-11-10 ENCOUNTER — Ambulatory Visit: Payer: Self-pay | Admitting: Neurology

## 2023-11-28 ENCOUNTER — Other Ambulatory Visit

## 2023-11-30 ENCOUNTER — Other Ambulatory Visit: Payer: Self-pay | Admitting: Neurology

## 2023-11-30 MED ORDER — DEXTROAMPHETAMINE SULFATE ER 10 MG PO CP24: ORAL_CAPSULE | ORAL | 0 refills | Status: DC

## 2023-11-30 NOTE — Telephone Encounter (Signed)
 Last seen 11/08/23 and next f/u 12/09/24. Last refilled 11/03/23 #120.

## 2023-11-30 NOTE — Telephone Encounter (Signed)
 Pt is requesting a refill for dextroamphetamine  (DEXEDRINE ) 10 MG 24 hr capsule .  Pharmacy: [facility] [phone]

## 2023-12-05 ENCOUNTER — Ambulatory Visit (INDEPENDENT_AMBULATORY_CARE_PROVIDER_SITE_OTHER)

## 2023-12-05 DIAGNOSIS — G35A Relapsing-remitting multiple sclerosis: Secondary | ICD-10-CM | POA: Diagnosis not present

## 2023-12-05 MED ORDER — GADOBENATE DIMEGLUMINE 529 MG/ML IV SOLN
20.0000 mL | Freq: Once | INTRAVENOUS | Status: AC | PRN
  Administered 2023-12-05: 20 mL via INTRAVENOUS

## 2023-12-11 ENCOUNTER — Other Ambulatory Visit: Payer: Self-pay | Admitting: Neurology

## 2023-12-11 DIAGNOSIS — G35D Multiple sclerosis, unspecified: Secondary | ICD-10-CM

## 2023-12-11 NOTE — Telephone Encounter (Signed)
 Last seen on 11/08/23 Follow up scheduled on 12/09/24

## 2023-12-12 ENCOUNTER — Other Ambulatory Visit: Payer: Self-pay

## 2023-12-12 ENCOUNTER — Encounter: Payer: Self-pay | Admitting: Neurology

## 2023-12-12 MED ORDER — DICLOFENAC SODIUM 1 % EX GEL: CUTANEOUS | 5 refills | Status: AC

## 2023-12-20 ENCOUNTER — Telehealth: Payer: Self-pay

## 2023-12-20 NOTE — Telephone Encounter (Signed)
 Received a fax stating the patient's prior authorization for Ozempic  1 mg dose will expire soon to please log in to renew.   Key:[id number]

## 2023-12-22 ENCOUNTER — Other Ambulatory Visit (HOSPITAL_COMMUNITY): Payer: Self-pay

## 2023-12-22 ENCOUNTER — Telehealth: Payer: Self-pay

## 2023-12-22 NOTE — Telephone Encounter (Signed)
 Pharmacy Patient Advocate Encounter  Received notification from [facility] that Prior Authorization for Ozempic  (1 MG/DOSE) 4MG /3ML pen-injectors has been APPROVED  to 11.28.26. Ran test claim, Copay is $RTS, RX WAS LAST FILLED  ON 11.18.25. This test claim was processed through [facility]- copay amounts may vary at other pharmacies due to pharmacy/plan contracts, or as the patient moves through the different stages of their insurance plan.   PA #/Case ID/Reference #: [id number]

## 2024-01-01 DIAGNOSIS — H40003 Preglaucoma, unspecified, bilateral: Secondary | ICD-10-CM | POA: Diagnosis not present

## 2024-01-01 DIAGNOSIS — E119 Type 2 diabetes mellitus without complications: Secondary | ICD-10-CM | POA: Diagnosis not present

## 2024-01-01 DIAGNOSIS — H25012 Cortical age-related cataract, left eye: Secondary | ICD-10-CM | POA: Diagnosis not present

## 2024-01-01 DIAGNOSIS — H2513 Age-related nuclear cataract, bilateral: Secondary | ICD-10-CM | POA: Diagnosis not present

## 2024-01-03 ENCOUNTER — Other Ambulatory Visit: Payer: Self-pay | Admitting: Neurology

## 2024-01-03 DIAGNOSIS — Z0289 Encounter for other administrative examinations: Secondary | ICD-10-CM

## 2024-01-03 MED ORDER — DEXTROAMPHETAMINE SULFATE ER 10 MG PO CP24: ORAL_CAPSULE | ORAL | 0 refills | Status: DC

## 2024-01-03 NOTE — Telephone Encounter (Signed)
 Pt called to request medication refill  dextroamphetamine  (DEXEDRINE ) 10 MG 24 hr capsule   Pt medication is to be sent to  [facility] [address] - [place], [place] - [address] AT [address] Phone: [phone]  Fax: [phone]

## 2024-01-03 NOTE — Telephone Encounter (Signed)
 Requested Prescriptions   Pending Prescriptions Disp Refills   dextroamphetamine  (DEXEDRINE ) 10 MG 24 hr capsule 120 capsule 0    Sig: Take 4 po qD   Last seen 11/08/23 Next appt 12/09/24 Dispenses   Dispensed Days Supply Quantity Provider Pharmacy  DEXTROAMPHET CAP 10MG  ER 12/02/2023 30 120 capsule Sater, Charlie LABOR, MD [facility] [phone] [place]...  DEXTROAMPHET CAP 10MG  ER 11/03/2023 30 120 capsule Lomax, Amy, NP [id number]...  DEXTROAMPHET CAP 10MG  ER 10/04/2023 30 120 capsule Lomax, Amy, NP [id number] [place]...  DEXTROAMPHET CAP 10MG  ER 09/05/2023 30 120 capsule Lomax, Amy, NP [id number] [place]...  DEXTROAMPHET CAP 10MG  ER 08/08/2023 29 117 capsule Sater, Charlie LABOR, MD [facility] [address]  DEXTROAMPHET CAP 10MG  ER 07/06/2023 30 120 capsule Sater, Charlie LABOR, MD [facility] [phone] [place]...  DEXTROAMPHET CAP 10MG  ER 06/07/2023 30 120 capsule Lomax, Amy, NP [id number] [place]...  DEXTROAMPHET CAP 10MG  ER 05/08/2023 30 120 capsule Lomax, Amy, NP [id number] [place]...  DEXTROAMPHET CAP 10MG  ER 04/06/2023 30 120 capsule Lomax, Amy, NP [id number] [place]...  DEXTROAMPHET CAP 10MG  ER 03/08/2023 30 120 capsule Sater, Charlie LABOR, MD [facility] [phone] [place]...  DEXTROAMPHETA SUL SR 10MG  CAP 02/20/2023 15 60 each Sater, Charlie LABOR, MD [facility]...  DEXTROAMPHETAMINE  10MG  ER CAPSULES 01/17/2023 30 120 each Sater, Charlie LABOR, MD [facility] #.[place]

## 2024-01-04 NOTE — Telephone Encounter (Deleted)
 Thank you! We have the form in our queue.

## 2024-01-11 NOTE — Telephone Encounter (Signed)
 Placed for medical records pick up

## 2024-01-14 ENCOUNTER — Other Ambulatory Visit: Payer: Self-pay | Admitting: Nurse Practitioner

## 2024-01-15 ENCOUNTER — Other Ambulatory Visit: Payer: Self-pay | Admitting: Neurology

## 2024-01-15 DIAGNOSIS — G35D Multiple sclerosis, unspecified: Secondary | ICD-10-CM

## 2024-01-16 NOTE — Telephone Encounter (Signed)
 On current medication list for patient Rosuvastatin  (Crestor ) 40 mg is listed not 20 mg as requested by patient's pharmacy

## 2024-01-29 NOTE — Telephone Encounter (Signed)
 I do not see any changes need to be made to form other than adding providers printed name to verify signature. Will place for medical records pick up to contact pt

## 2024-02-05 ENCOUNTER — Other Ambulatory Visit: Payer: Self-pay | Admitting: Neurology

## 2024-02-05 MED ORDER — DEXTROAMPHETAMINE SULFATE ER 10 MG PO CP24: ORAL_CAPSULE | ORAL | 0 refills | Status: AC

## 2024-02-05 NOTE — Telephone Encounter (Signed)
 Requested Prescriptions   Pending Prescriptions Disp Refills   dextroamphetamine  (DEXEDRINE ) 10 MG 24 hr capsule 120 capsule 0    Sig: Take 4 po qD   Last seen 11/08/23 Next appt 12/09/24 Dispenses   Dispensed Days Supply Quantity Provider Pharmacy  DEXTROAMPHET CAP 10MG  ER 01/03/2024 30 120 capsule Sater, Charlie LABOR, MD [facility] [phone] [place]...  DEXTROAMPHET CAP 10MG  ER 12/02/2023 30 120 capsule Sater, Charlie LABOR, MD [facility] [phone] [place]...  DEXTROAMPHET CAP 10MG  ER 11/03/2023 30 120 capsule Lomax, Amy, NP [id number] [place]...  DEXTROAMPHET CAP 10MG  ER 10/04/2023 30 120 capsule Lomax, Amy, NP [id number] [place]...  DEXTROAMPHET CAP 10MG  ER 09/05/2023 30 120 capsule Lomax, Amy, NP [id number] [place]...  DEXTROAMPHET CAP 10MG  ER 08/08/2023 29 117 capsule Sater, Charlie LABOR, MD [facility] [address]...  DEXTROAMPHET CAP 10MG  ER 07/06/2023 30 120 capsule Sater, Charlie LABOR, MD [facility] [phone] [place]...  DEXTROAMPHET CAP 10MG  ER 06/07/2023 30 120 capsule Lomax, Amy, NP [id number] [place]...  DEXTROAMPHET CAP 10MG  ER 05/08/2023 30 120 capsule Lomax, Amy, NP [id number] [place]...  DEXTROAMPHET CAP 10MG  ER 04/06/2023 30 120 capsule Lomax, Amy, NP [id number] [place]...  DEXTROAMPHET CAP 10MG  ER 03/08/2023 30 120 capsule Sater, Charlie LABOR, MD [facility] [phone] [place]...  DEXTROAMPHETA SUL SR 10MG  CAP 02/20/2023 15 60 each Sater, Charlie LABOR, MD [facility]

## 2024-02-05 NOTE — Telephone Encounter (Signed)
 Pt is requesting a refill for dextroamphetamine  (DEXEDRINE ) 10 MG 24 hr capsule .  Pharmacy: [facility] [phone]

## 2024-02-16 ENCOUNTER — Telehealth: Payer: Self-pay | Admitting: Pharmacist

## 2024-02-16 ENCOUNTER — Other Ambulatory Visit (HOSPITAL_COMMUNITY): Payer: Self-pay

## 2024-02-16 ENCOUNTER — Telehealth: Payer: Self-pay

## 2024-02-16 NOTE — Telephone Encounter (Signed)
 Pharmacy Patient Advocate Encounter   Received notification from [facility] that prior authorization for Ozempic  (1 MG/DOSE) 4MG /3ML pen-injectors is required/requested.   Insurance verification completed.   The patient is insured through [facility].   Per test claim: PA required; PA submitted to above mentioned insurance via Latent Key/confirmation #/EOC [id number] Status is pending

## 2024-02-16 NOTE — Telephone Encounter (Signed)
 Clinical questions have been answered and PA submitted. PA currently Pending. Please be advised that most companies allow up to 30 days to make a decision. We will advise when a determination has been made, or follow up in 1 week.   Please reach out to our team, Rx Prior Auth Pool, if you haven't heard back in a week.

## 2024-02-16 NOTE — Telephone Encounter (Signed)
 Pharmacy Patient Advocate Encounter   Received notification from Physician's Office that prior authorization for Zeposia  0.92mg  is required/requested.   Insurance verification completed.   The patient is insured through [facility].   Per test claim: PA required; PA started via CoverMyMeds. KEY [id number] . Waiting for clinical questions to populate.

## 2024-02-19 NOTE — Telephone Encounter (Signed)
 Pharmacy Patient Advocate Encounter  Received notification from [facility] that Prior Authorization for ZEPOSIA  0.92 MG CAPS has been APPROVED from 02/19/2024 to 02/18/2025   PA #/Case ID/Reference #: [id number]

## 2024-02-19 NOTE — Telephone Encounter (Signed)
 Mychart message has been sent to the patient regarding approval.

## 2024-02-21 ENCOUNTER — Other Ambulatory Visit (HOSPITAL_COMMUNITY): Payer: Self-pay

## 2024-02-21 NOTE — Telephone Encounter (Signed)
 Pharmacy Patient Advocate Encounter  Received notification from [facility] that Prior Authorization for Ozempic  (1 MG/DOSE) 4MG /3ML pen-injectors Last filled 01/13/2024 Next fill date 03/16/2024    PA #/Case ID/Reference #: [id number]

## 2024-02-22 ENCOUNTER — Other Ambulatory Visit: Payer: Self-pay | Admitting: Nurse Practitioner

## 2024-02-22 DIAGNOSIS — E1169 Type 2 diabetes mellitus with other specified complication: Secondary | ICD-10-CM

## 2024-02-23 NOTE — Telephone Encounter (Signed)
 Please review and advise:  Requesting: Semaglutide  (Ozempic ) 1 mg Directions:Inject 1 mg into skin once a week  Last Visit: 08/08/2023 Next Visit: 04/19/2024 Last Refill: 08/08/23 Labs:02/20/23

## 2024-02-25 ENCOUNTER — Encounter: Payer: Self-pay | Admitting: Family Medicine

## 2024-02-26 ENCOUNTER — Telehealth: Payer: Self-pay

## 2024-02-26 NOTE — Telephone Encounter (Signed)
 Good Morning can someone please do a PA for pt's Dextroamphetamine  (DEXEDRINE ) 10mg  24hr Capsules.

## 2024-02-27 ENCOUNTER — Telehealth: Payer: Self-pay

## 2024-02-27 ENCOUNTER — Other Ambulatory Visit (HOSPITAL_COMMUNITY): Payer: Self-pay

## 2024-02-27 NOTE — Telephone Encounter (Signed)
 Pharmacy Patient Advocate Encounter   Received notification from Pt Calls Messages that prior authorization for Dextroamphetamine  Sulfate ER 10MG  er capsules is required/requested.   Insurance verification completed.   The patient is insured through [facility].   Per test claim: PA required; PA submitted to above mentioned insurance via Latent Key/confirmation #/EOC [id number] Status is pending

## 2024-02-28 ENCOUNTER — Other Ambulatory Visit (HOSPITAL_COMMUNITY): Payer: Self-pay

## 2024-02-28 NOTE — Telephone Encounter (Signed)
 Pharmacy Patient Advocate Encounter  Received notification from [facility] that Prior Authorization for Dextroamphetamine  Sulfate ER 10MG  er capsules has been APPROVED from 02-27-2024 to 02-26-2025. Ran test claim, Copay is $5.00. This test claim was processed through [facility]- copay amounts may vary at other pharmacies due to pharmacy/plan contracts, or as the patient moves through the different stages of their insurance plan.   PA #/Case ID/Reference #: [id number]

## 2024-04-19 ENCOUNTER — Encounter: Admitting: Nurse Practitioner

## 2024-12-09 ENCOUNTER — Ambulatory Visit: Admitting: Family Medicine
